# Patient Record
Sex: Male | Born: 1935 | Race: White | Hispanic: No | Smoking: Former smoker
Health system: Southern US, Community
[De-identification: ages and names within clinical notes are randomized; demographics above are authoritative.]

## PROBLEM LIST (undated history)

## (undated) DIAGNOSIS — G459 Transient cerebral ischemic attack, unspecified: Secondary | ICD-10-CM

## (undated) DIAGNOSIS — E119 Type 2 diabetes mellitus without complications: Secondary | ICD-10-CM

## (undated) DIAGNOSIS — R55 Syncope and collapse: Secondary | ICD-10-CM

## (undated) DIAGNOSIS — R2681 Unsteadiness on feet: Secondary | ICD-10-CM

## (undated) DIAGNOSIS — IMO0001 Reserved for inherently not codable concepts without codable children: Secondary | ICD-10-CM

## (undated) DIAGNOSIS — N183 Chronic kidney disease, stage 3 unspecified: Secondary | ICD-10-CM

## (undated) DIAGNOSIS — Z794 Long term (current) use of insulin: Secondary | ICD-10-CM

## (undated) DIAGNOSIS — R609 Edema, unspecified: Secondary | ICD-10-CM

## (undated) DIAGNOSIS — M109 Gout, unspecified: Secondary | ICD-10-CM

## (undated) DIAGNOSIS — I2510 Atherosclerotic heart disease of native coronary artery without angina pectoris: Secondary | ICD-10-CM

## (undated) DIAGNOSIS — M1990 Unspecified osteoarthritis, unspecified site: Secondary | ICD-10-CM

## (undated) DIAGNOSIS — I10 Essential (primary) hypertension: Secondary | ICD-10-CM

## (undated) HISTORY — DX: Unspecified osteoarthritis, unspecified site: M19.90

## (undated) HISTORY — PX: TONSILLECTOMY: SUR1361

## (undated) HISTORY — DX: Long term (current) use of insulin: Z79.4

## (undated) HISTORY — PX: HEMORRHOID SURGERY: SHX153

## (undated) HISTORY — DX: Syncope and collapse: R55

## (undated) HISTORY — DX: Edema, unspecified: R60.9

## (undated) HISTORY — DX: Unsteadiness on feet: R26.81

## (undated) HISTORY — DX: Reserved for inherently not codable concepts without codable children: IMO0001

## (undated) HISTORY — DX: Atherosclerotic heart disease of native coronary artery without angina pectoris: I25.10

## (undated) HISTORY — DX: Transient cerebral ischemic attack, unspecified: G45.9

## (undated) HISTORY — DX: Essential (primary) hypertension: I10

## (undated) HISTORY — PX: IVC FILTER INSERTION: CATH118245

## (undated) HISTORY — DX: Chronic kidney disease, stage 3 (moderate): N18.3

## (undated) HISTORY — DX: Type 2 diabetes mellitus without complications: E11.9

## (undated) HISTORY — DX: Chronic kidney disease, stage 3 unspecified: N18.30

## (undated) HISTORY — DX: Gout, unspecified: M10.9

---
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
## 1988-03-19 HISTORY — PX: CORONARY ARTERY BYPASS GRAFT: SHX141

## 2008-03-19 DIAGNOSIS — G459 Transient cerebral ischemic attack, unspecified: Secondary | ICD-10-CM

## 2008-03-19 HISTORY — DX: Transient cerebral ischemic attack, unspecified: G45.9

## 2013-03-19 DIAGNOSIS — R55 Syncope and collapse: Secondary | ICD-10-CM

## 2013-03-19 HISTORY — DX: Syncope and collapse: R55

## 2017-01-09 LAB — BASIC METABOLIC PANEL
BUN: 30 — AB (ref 4–21)
Creatinine: 1.7 — AB (ref 0.6–1.3)
Glucose: 256
Potassium: 4.3 (ref 3.4–5.3)
SODIUM: 136 — AB (ref 137–147)

## 2017-01-09 LAB — HEPATIC FUNCTION PANEL
ALK PHOS: 85 (ref 25–125)
ALT: 17 (ref 10–40)
AST: 18 (ref 14–40)
BILIRUBIN, TOTAL: 0.6

## 2017-01-09 LAB — CBC AND DIFFERENTIAL
HEMATOCRIT: 43 (ref 41–53)
HEMOGLOBIN: 14.3 (ref 13.5–17.5)
PLATELETS: 90 — AB (ref 150–399)
WBC: 7.5

## 2017-01-09 LAB — HEMOGLOBIN A1C: HEMOGLOBIN A1C: 8

## 2017-01-09 LAB — VITAMIN D 25 HYDROXY (VIT D DEFICIENCY, FRACTURES): Vit D, 25-Hydroxy: 41

## 2017-04-17 LAB — BASIC METABOLIC PANEL
BUN: 30 — AB (ref 4–21)
Creatinine: 1.6 — AB (ref 0.6–1.3)
Glucose: 192
Potassium: 4.2 (ref 3.4–5.3)
Sodium: 137 (ref 137–147)

## 2017-04-17 LAB — HEPATIC FUNCTION PANEL
ALK PHOS: 93 (ref 25–125)
ALT: 18 (ref 10–40)
AST: 20 (ref 14–40)
BILIRUBIN, TOTAL: 0.6

## 2017-04-17 LAB — HEMOGLOBIN A1C: HEMOGLOBIN A1C: 8.1

## 2017-08-14 ENCOUNTER — Ambulatory Visit: Payer: Self-pay | Admitting: Internal Medicine

## 2017-11-06 ENCOUNTER — Encounter: Payer: Self-pay | Admitting: Internal Medicine

## 2018-01-01 ENCOUNTER — Encounter: Payer: Self-pay | Admitting: Internal Medicine

## 2018-01-01 ENCOUNTER — Ambulatory Visit (INDEPENDENT_AMBULATORY_CARE_PROVIDER_SITE_OTHER): Payer: Medicare Other | Admitting: Internal Medicine

## 2018-01-01 VITALS — BP 148/80 | HR 56 | Ht 67.0 in | Wt 187.0 lb

## 2018-01-01 DIAGNOSIS — E782 Mixed hyperlipidemia: Secondary | ICD-10-CM | POA: Diagnosis not present

## 2018-01-01 DIAGNOSIS — IMO0001 Reserved for inherently not codable concepts without codable children: Secondary | ICD-10-CM

## 2018-01-01 DIAGNOSIS — I2581 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: Secondary | ICD-10-CM | POA: Diagnosis not present

## 2018-01-01 DIAGNOSIS — Z951 Presence of aortocoronary bypass graft: Secondary | ICD-10-CM | POA: Diagnosis not present

## 2018-01-01 DIAGNOSIS — E119 Type 2 diabetes mellitus without complications: Secondary | ICD-10-CM

## 2018-01-01 DIAGNOSIS — I10 Essential (primary) hypertension: Secondary | ICD-10-CM

## 2018-01-01 DIAGNOSIS — Z794 Long term (current) use of insulin: Secondary | ICD-10-CM

## 2018-01-01 NOTE — Patient Instructions (Addendum)
  Medication Instructions:  Continue current medications If you need a refill on your cardiac medications before your next appointment, please call your pharmacy.   Lab work: NONE   Testing/Procedures: NONE  Follow-Up: At [facility], you and your health needs are our priority.  As part of our continuing mission to provide you with exceptional heart care, we have created designated Provider Care Teams.  These Care Teams include your primary Cardiologist (physician) and Advanced Practice Providers (APPs -  Physician Assistants and Nurse Practitioners) who all work together to provide you with the care you need, when you need it. You will need a follow up appointment in 6 months.  Please call our office 2 months in advance to schedule this appointment.  You may see Dr. Debara Pickett or one of the following Advanced Practice Providers on your designated Care Team: Almyra Deforest, [place] . Fabian Sharp, PA-C  Any Other Special Instructions Will Be Listed Below (If Applicable).

## 2018-01-02 ENCOUNTER — Encounter: Payer: Self-pay | Admitting: Internal Medicine

## 2018-01-02 NOTE — Progress Notes (Signed)
OFFICE CONSULT NOTE  Chief Complaint:  Establish cardiologist  Primary Care Physician: Troy Poll, MD  HPI:  Troy Hicks is a 84 y.o. male who is being seen today for the evaluation of establishing cardiolgist at the request of Troy Papa, DO. This is a pleasant 84 year old male veteran who was previously living in [place] for the past 40 years.  He is originally from [place].  Recently he moved down here with his wife who unfortunately recently fell and is currently hospitalized with hip fracture and A. fib.  His past medical history is significant for coronary artery disease status post coronary artery bypass grafting a number of years ago, ischemic cardiomyopathy with EF around 16 to 40%, SVT, diabetes, hypertension, lower extremity venous insufficiency and some mitral and tricuspid valvular heart disease.  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  Currently is asymptomatic denies any chest pain or worsening shortness of breath.  Family history significant for heart disease in his mother who died at age 25.  PMHx:  Past Medical History:  Diagnosis Date  . Chronic kidney disease (CKD), stage III (moderate) ([place])    Per New Patient Packet,PSC   . Coronary artery disease    Per New Patient Packet,PSC   . Edema    Per New Patient Packet,PSC   . Gait instability    Per New Patient Packet,PSC   . Gout    Per New Patient Packet,PSC   . Hypertension    Per New Patient Packet,PSC   . Insulin dependent diabetes mellitus [facility]    Per New Patient Packet,PSC   . Osteoarthritis    Per New Patient Packet,PSC   . Syncope 2015   Per New Patient Packet,PSC   . TIA (transient ischemic attack) 2010   Per New Patient Packet,PSC     Past Surgical History:  Procedure Laterality Date  . CORONARY ARTERY BYPASS GRAFT  1990   Per New Patient Packet,PSC   . IVC FILTER INSERTION     Per New Patient Packet,PSC   . PILONIDAL  CYST EXCISION  1944   Per New Patient [place]     Per New Patient Packet,PSC (childhood)     FAMHx:  Family History  Problem Relation Age of Onset  . Diabetes Mother   . Heart disease Mother   . Throat cancer Father   . Cirrhosis Son   . Alcoholism Son   . Ovarian cancer Daughter   . Depression Daughter   . Alcoholism Daughter     SOCHx:   reports that he quit smoking about 60 years ago. He has a 7.50 pack-year smoking history. He has never used smokeless tobacco. He reports that he drinks alcohol. He reports that he has current or past drug history.  ALLERGIES:  Not on File  ROS: Pertinent items noted in HPI and remainder of comprehensive ROS otherwise negative.  HOME MEDS: Current Outpatient Medications on File Prior to Visit  Medication Sig Dispense Refill  . aspirin EC 81 MG tablet Take 81 mg by mouth daily.    . Colchicine 0.6 MG CAPS Take 1 capsule by mouth as directed.    . furosemide (LASIX) 80 MG tablet Take 80 mg by mouth daily.    . Insulin Aspart (NOVOLOG FLEXPEN [place]) Inject into the skin. Inject 2 units into the skin only if blood sugar is above 150    . Insulin Glargine (LANTUS SOLOSTAR)  100 UNIT/ML Solostar Pen Inject into the skin 2 (two) times daily. Takes 32 units in the A.M and 20 units at bedtime    . levothyroxine (SYNTHROID, LEVOTHROID) 88 MCG tablet Take 88 mcg by mouth daily before breakfast.    . metoprolol tartrate (LOPRESSOR) 25 MG tablet Take 25 mg by mouth 2 (two) times daily.  0  . potassium chloride SA (K-DUR,KLOR-CON) 20 MEQ tablet Take 20 mEq by mouth daily.    . probenecid (BENEMID) 500 MG tablet Take 500 mg by mouth daily.    . simvastatin (ZOCOR) 80 MG tablet Take 80 mg by mouth daily.    . traZODone (DESYREL) 50 MG tablet Take 50 mg by mouth at bedtime.     No current facility-administered medications on file prior to visit.     LABS/IMAGING: No results found for this or any previous visit (from the past 48  hour(s)). No results found.  LIPID PANEL: No results found for: Hicks, TRIG, HDL, CHOLHDL, VLDL, LDLCALC, LDLDIRECT  WEIGHTS: Wt Readings from Last 3 Encounters:  01/01/18 187 lb (84.8 kg)    VITALS: BP (!) 148/80   Pulse (!) 56   Ht 5\' 7"  (1.702 m)   Wt 187 lb (84.8 kg)   BMI 29.29 kg/m   EXAM: General appearance: alert and no distress Neck: no carotid bruit, no JVD and thyroid not enlarged, symmetric, no tenderness/mass/nodules Lungs: clear to auscultation bilaterally Heart: regular rate and rhythm, S1, S2 normal and systolic murmur: early systolic 2/6, blowing at apex Abdomen: soft, non-tender; bowel sounds normal; no masses,  no organomegaly Extremities: extremities normal, atraumatic, no cyanosis or edema Pulses: 2+ and symmetric Skin: Skin color, texture, turgor normal. No rashes or lesions Neurologic: Grossly normal Psych: Pleasant  EKG: Sinus bradycardia with marked sinus arrhythmia and first-degree AV block, RBBB, LVH with repolarization abnormality- personally reviewed  ASSESSMENT: 1. Coronary artery disease status post CABG 2. Ischemic cardiomyopathy EF 35 to 40% 3. History of PSVT 4. Type 2 diabetes 5. Hypertension 6. Dyslipidemia 7. History of DVT status post IVC filter  PLAN: 1.   [name] has a long-standing history of heart disease and ischemic cardiomyopathy.  He seems compensated and although he moves slowly probably endorses no more than NYHA class II symptoms.  We will need to gather more information on his type 2 diabetes hypertension and dyslipidemia.  I did receive records from his previous provider Dr. Verner Hicks indicating an echocardiogram that was performed at [facility] in 2016.  No further testing necessary at this time.  Plan follow-up with me in 6 months or sooner as necessary.  Troy Casino, MD, [facility], [place] Director of the Advanced Lipid Disorders &  Cardiovascular Risk Reduction  Clinic Diplomate of the American Board of Clinical Lipidology Attending Cardiologist  Direct Dial: [phone]  Fax: [phone]  Website:  [email or web address] 01/02/2018, 5:08 PM

## 2018-01-24 ENCOUNTER — Ambulatory Visit: Payer: Self-pay | Admitting: Cardiology

## 2018-03-27 ENCOUNTER — Telehealth: Payer: Self-pay | Admitting: Internal Medicine

## 2018-03-27 NOTE — Telephone Encounter (Signed)
Returned call to pt daughter [name]. lmtcb.

## 2018-03-27 NOTE — Telephone Encounter (Signed)
Follow up   Pt returning call for nurse, asked to please leave a detailed message because of having to hold when calling. Please call

## 2018-03-27 NOTE — Telephone Encounter (Signed)
New Message   Pt c/o Shortness Of Breath: STAT if SOB developed within the last 24 hours or pt is noticeably SOB on the phone  1. Are you currently SOB (can you hear that pt is SOB on the phone)? No, patients daughter is not with the patient    2. How long have you been experiencing SOB? About a week in a half patients daughter has been more aware   3. Are you SOB when sitting or when up moving around? At times when he is sitting and increased more when moving   4. Are you currently experiencing any other symptoms? Fatigue and sometime when he is talking he in mid sentence its as if he is sob

## 2018-03-27 NOTE — Telephone Encounter (Addendum)
Spoke with pt daughter Troy Hicks. Troy Hicks sts that the pt has been having increased sob. He does have chronic LE edema that she sts is stable. Pt does not weigh daily. He takes Lasix 40mg  twice daily.  Troy Hicks also reports that the pt is more fatigued and has a poor appetite.  The pt is scheduled with Dr.Hilty on 04/22/18. Offered an appt sooner with an APP. They would prefer to see Dr.Hilty, appt moved up to 04/10/18 @ 3:15pm. Adv pt daughter to contact the office sooner if symptoms worsen. Troy Hicks verbalizes understanding.

## 2018-04-10 ENCOUNTER — Ambulatory Visit (INDEPENDENT_AMBULATORY_CARE_PROVIDER_SITE_OTHER): Payer: Medicare Other | Admitting: Internal Medicine

## 2018-04-10 ENCOUNTER — Encounter (INDEPENDENT_AMBULATORY_CARE_PROVIDER_SITE_OTHER): Payer: Self-pay

## 2018-04-10 ENCOUNTER — Encounter: Payer: Self-pay | Admitting: Internal Medicine

## 2018-04-10 VITALS — BP 110/70 | HR 56 | Ht 67.0 in | Wt 182.0 lb

## 2018-04-10 DIAGNOSIS — Z951 Presence of aortocoronary bypass graft: Secondary | ICD-10-CM

## 2018-04-10 DIAGNOSIS — I255 Ischemic cardiomyopathy: Secondary | ICD-10-CM | POA: Insufficient documentation

## 2018-04-10 DIAGNOSIS — R0602 Shortness of breath: Secondary | ICD-10-CM

## 2018-04-10 DIAGNOSIS — I10 Essential (primary) hypertension: Secondary | ICD-10-CM

## 2018-04-10 DIAGNOSIS — I2581 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: Secondary | ICD-10-CM

## 2018-04-10 NOTE — Patient Instructions (Signed)
Medication Instructions:  Continue current medications If you need a refill on your cardiac medications before your next appointment, please call your pharmacy.   Lab work: BMET, BNP to be completed today If you have labs (blood work) drawn today and your tests are completely normal, you will receive your results only by: Marland Kitchen MyChart Message (if you have MyChart) OR . A paper copy in the mail If you have any lab test that is abnormal or we need to change your treatment, we will call you to review the results.  Testing/Procedures: Your physician has requested that you have an echocardiogram. Echocardiography is a painless test that uses sound waves to create images of your heart. It provides your doctor with information about the size and shape of your heart and how well your heart's chambers and valves are working. This procedure takes approximately one hour. There are no restrictions for this procedure. -- this is done at [facility] on [address] (near [facility])  - [address] - [address]rd Floor  Follow-Up: in April as planneded  Any Other Special Instructions Will Be Listed Below (If Applicable).

## 2018-04-10 NOTE — Progress Notes (Signed)
OFFICE CONSULT NOTE  Chief Complaint:  Acute shortness of breath  Primary Care Physician: Reymundo Poll, MD  HPI:  Troy Hicks is a 85 y.o. male who is being seen today for the evaluation of establishing cardiolgist at the request of Reymundo Poll, MD. This is a pleasant 85 year old male veteran who was previously living in [place] for the past 40 years.  He is originally from [place].  Recently he moved down here with his wife who unfortunately recently fell and is currently hospitalized with hip fracture and A. fib.  His past medical history is significant for coronary artery disease status post coronary artery bypass grafting a number of years ago, ischemic cardiomyopathy with EF around 107 to 40%, SVT, diabetes, hypertension, lower extremity venous insufficiency and some mitral and tricuspid valvular heart disease.  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  Currently is asymptomatic denies any chest pain or worsening shortness of breath.  Family history significant for heart disease in his mother who died at age 53.  05-08-18  Troy Hicks returns today for follow-up.  Actually this was not a routine visit.  His daughter brought him in because he has had worsening fatigue and shortness of breath.  Unfortunately his wife died in 04-07-23.  He since then has been somewhat depressed.  He has been complaining of chronic back pain.  He is also been short of breath to her observation but denies any chest pain.  Blood pressures well controlled today.  He is actually had some weight loss.  His appetite has decreased.  He has seen a psychiatrist who prescribes medications to help with sleep and was placed on Zoloft.  PMHx:  Past Medical History:  Diagnosis Date  . Chronic kidney disease (CKD), stage III (moderate) ([place])    Per New Patient Packet,PSC   . Coronary artery disease    Per New Patient Packet,PSC   . Edema    Per New  Patient Packet,PSC   . Gait instability    Per New Patient Packet,PSC   . Gout    Per New Patient Packet,PSC   . Hypertension    Per New Patient Packet,PSC   . Insulin dependent diabetes mellitus [facility])    Per New Patient Packet,PSC   . Osteoarthritis    Per New Patient Packet,PSC   . Syncope 2015   Per New Patient Packet,PSC   . TIA (transient ischemic attack) 2010   Per New Patient Packet,PSC     Past Surgical History:  Procedure Laterality Date  . CORONARY ARTERY BYPASS GRAFT  1990   Per New Patient Packet,PSC   . IVC FILTER INSERTION     Per New Patient Packet,PSC   . PILONIDAL CYST EXCISION  1944   Per New Patient [place]     Per New Patient Packet,PSC (childhood)     FAMHx:  Family History  Problem Relation Age of Onset  . Diabetes Mother   . Heart disease Mother   . Throat cancer Father   . Cirrhosis Son   . Alcoholism Son   . Ovarian cancer Daughter   . Depression Daughter   . Alcoholism Daughter     SOCHx:   reports that he quit smoking about 61 years ago. He has a 7.50 pack-year smoking history. He has never used smokeless tobacco. He reports current alcohol use. He reports previous drug use.  ALLERGIES:  No Known Allergies  ROS: Pertinent items noted in HPI and remainder of comprehensive ROS otherwise negative.  HOME MEDS: Current Outpatient Medications on File Prior to Visit  Medication Sig Dispense Refill  . Alogliptin Benzoate 25 MG TABS Take 1 tablet by mouth daily.    Marland Kitchen aspirin EC 81 MG tablet Take 81 mg by mouth daily.    . Colchicine 0.6 MG CAPS Take 1 capsule by mouth as directed.    . furosemide (LASIX) 80 MG tablet Take 80 mg by mouth daily.    . insulin glargine (LANTUS) 100 UNIT/ML injection Inject 5 Units into the skin at bedtime.    Marland Kitchen levothyroxine (SYNTHROID, LEVOTHROID) 100 MCG tablet Take 100 mcg by mouth daily before breakfast.    . metoprolol tartrate (LOPRESSOR) 25 MG tablet Take 25 mg by mouth 2 (two)  times daily.  0  . Multiple Vitamins-Minerals (PRESERVISION AREDS) CAPS Take 1 capsule by mouth 2 (two) times daily.    . potassium chloride SA (K-DUR,KLOR-CON) 20 MEQ tablet Take 20 mEq by mouth daily.    . sertraline (ZOLOFT) 25 MG tablet Take 25 mg by mouth daily.    . simvastatin (ZOCOR) 80 MG tablet Take 80 mg by mouth daily.    . traMADol (ULTRAM) 50 MG tablet Take 25 mg by mouth 2 (two) times daily.    . traZODone (DESYREL) 50 MG tablet Take 50 mg by mouth at bedtime.     No current facility-administered medications on file prior to visit.     LABS/IMAGING: No results found for this or any previous visit (from the past 48 hour(s)). No results found.  LIPID PANEL: No results found for: CHOL, TRIG, HDL, CHOLHDL, VLDL, LDLCALC, LDLDIRECT  WEIGHTS: Wt Readings from Last 3 Encounters:  04/10/18 182 lb (82.6 kg)  01/01/18 187 lb (84.8 kg)    VITALS: BP 110/70   Pulse (!) 56   Ht 5\' 7"  (1.702 m)   Wt 182 lb (82.6 kg)   BMI 28.51 kg/m   EXAM: General appearance: alert and no distress Neck: no carotid bruit, no JVD and thyroid not enlarged, symmetric, no tenderness/mass/nodules Lungs: diminished breath sounds bilaterally Heart: regular rate and rhythm, S1, S2 normal and systolic murmur: early systolic 2/6, blowing at apex Abdomen: soft, non-tender; bowel sounds normal; no masses,  no organomegaly Extremities: edema Trace bilateral pedal edema Pulses: 2+ and symmetric Skin: Skin color, texture, turgor normal. No rashes or lesions Neurologic: Grossly normal Psych: Pleasant  EKG: Sinus bradycardia marked sinus arrhythmia 56, nonspecific IVCD and inferior infarct pattern-personally reviewed  ASSESSMENT: 1. Acute dyspnea on exertion 2. Coronary artery disease status post CABG 3. Ischemic cardiomyopathy EF 35 to 40% 4. History of PSVT 5. Type 2 diabetes 6. Hypertension 7. Dyslipidemia 8. History of DVT status post IVC filter  PLAN: 1.   Troy Hicks has recently had  some acute dyspnea on exertion but actually weight loss.  His appetite is decreased and he is struggling with grieving or possible early depression.  He has been placed on antidepressant medication.  I do not detect any worsening heart failure symptoms.  Would like to repeat an echo since he has a history of ischemic cardiomyopathy and will get labs including a BMET and BNP.  Possible further adjustment in his medications based on these findings.  Otherwise we will plan follow-up as scheduled in April.  Pixie Casino, MD, [facility], [place] Director of the Advanced Lipid Disorders &  Cardiovascular Risk  Reduction Clinic Diplomate of the American Board of Clinical Lipidology Attending Cardiologist  Direct Dial: [phone]  Fax: [phone]  Website:  [email or web address] 04/10/2018, 3:33 PM

## 2018-04-11 ENCOUNTER — Telehealth: Payer: Self-pay | Admitting: Internal Medicine

## 2018-04-11 DIAGNOSIS — Z79899 Other long term (current) drug therapy: Secondary | ICD-10-CM

## 2018-04-11 LAB — BASIC METABOLIC PANEL
BUN/Creatinine Ratio: 22 (ref 10–24)
BUN: 42 mg/dL — AB (ref 10–36)
CHLORIDE: 100 mmol/L (ref 96–106)
CO2: 21 mmol/L (ref 20–29)
Calcium: 8.7 mg/dL (ref 8.6–10.2)
Creatinine, Ser: 1.87 mg/dL — ABNORMAL HIGH (ref 0.76–1.27)
GFR calc Af Amer: 34 mL/min/{1.73_m2} — ABNORMAL LOW (ref >59)
GFR calc non Af Amer: 30 mL/min/{1.73_m2} — ABNORMAL LOW (ref >59)
GLUCOSE: 263 mg/dL — AB (ref 65–99)
Potassium: 5.1 mmol/L (ref 3.5–5.2)
Sodium: 137 mmol/L (ref 134–144)

## 2018-04-11 LAB — PRO B NATRIURETIC PEPTIDE: NT-Pro BNP: 1719 pg/mL — ABNORMAL HIGH (ref 0–486)

## 2018-04-11 NOTE — Telephone Encounter (Signed)
Notified daughter of results. BMET ordered to be done same day as echo @ [address] Labs routed to PCP per request. Per daughter, no new Rx needed now - if dose increase is permanent, will likely need printed Rx to take to [place]

## 2018-04-11 NOTE — Telephone Encounter (Signed)
Notes recorded by Pixie Casino, MD on 04/11/2018 at 10:02 AM EST Creatinine up - BNP elevated. Suspect worsening CHF. Increase lasix to 80 mg BID - repeat BMET in 1 week. Echo pending.  Dr. Lemmie Evens

## 2018-04-14 ENCOUNTER — Telehealth: Payer: Self-pay | Admitting: Internal Medicine

## 2018-04-14 NOTE — Telephone Encounter (Signed)
Called Assisted Living, they advised they could not take a verbal order. It should have to come from the MD. Advised that MD was not here today. They will send a blank hard script faxed over attn to me to have. Will fill out and give to Dr.Hilty and his nurse to sign and have it faxed back to Assisted living.

## 2018-04-14 NOTE — Telephone Encounter (Signed)
New Message   Pt c/o medication issue:  1. Name of Medication: Lasix  2. How are you currently taking this medication (dosage and times per day)? 80mg  2xdaily   3. Are you having a reaction (difficulty breathing--STAT)? No  4. What is your medication issue? pts daughter is calling because she needs a fax of a hard script change to Pts assited living facility   [facility]  Fax # [phone]  ATTN: MED Tech   Pts daughter says this needs to be done ASAP so he can take his medication

## 2018-04-15 MED ORDER — FUROSEMIDE 80 MG PO TABS: 80.0000 mg | ORAL_TABLET | Freq: Two times a day (BID) | ORAL | 1 refills | Status: DC

## 2018-04-15 NOTE — Telephone Encounter (Signed)
° ° °  Patient's daughter is calling back to confirm if fax has been sent regarding Lasix to the assisted living and the PCP. Please call [phone]

## 2018-04-15 NOTE — Telephone Encounter (Signed)
Troy Hicks, [place] on MD care team signed printed Rx and this was faxed to [place] [facility] as no hard copy Rx request was received from this facility.   LM for daughter with this info.

## 2018-04-16 ENCOUNTER — Telehealth: Payer: Self-pay | Admitting: Internal Medicine

## 2018-04-16 DIAGNOSIS — Z79899 Other long term (current) drug therapy: Secondary | ICD-10-CM

## 2018-04-16 NOTE — Telephone Encounter (Signed)
Called and spoke with patient's daughter about medication. She states she is very frustrated with our phone system and does not have 20 minutes to wait on the phone to return a call, of which I requested to obtain a contact for [facility] to f/up on lasix Rx. I apologized to daughter for this inconvenience. She states that PCP did not receive labs that I faxed via Epic on 04/11/18 @ 11:02am (informed her of this). She is frustrated that her dad has not yet received increased lasix dose. Explained that when we spoke on 1/24 about results, I was unaware that patient needed a new Rx for this medication sent to [facility] and there was a delay in faxing the printed Rx as [facility] was supposed to send something to our office on 1/27 which was not received. The Rx sent via fax on 1/28 was not received per daughter but the 1/29 fax was received. Again, apologized to daughter about this delay. Explained that I will cancel 04/17/18 lab visit at [facility] office and she will bring to [facility] for BMET once patient has been on increased lasix dose for 1 week. Lab ordered. Explained to daughter that she can look into MyChart for her dad to alleviate phone frustration, but she states she does not do anything medically related on these portals.   No further assistance needed.

## 2018-04-16 NOTE — Telephone Encounter (Signed)
Aaron Edelman from [facility] (pharmacy) called in regarding lasix Rx. Notified him of lasix dose increase from 80mg  QD to BID for 1 week and then repeat BMET.   (p) [phone]

## 2018-04-16 NOTE — Telephone Encounter (Signed)
  Daughter is calling because her dads prescription for lasix has not been faxed to [facility]. She stated that she talked to [place] yesterday and was told it was faxed. Patient is supposed to come in tomorrow for blood work related to this new script. Patient will need to know when they should come for the lab work once he gets to start the script. Please fax to [phone] to [facility].

## 2018-04-16 NOTE — Telephone Encounter (Signed)
LM for daughter that Rx was faxed yesterday and I will re-fax today. Requested that she provide phone # for Alfredo Bach so I can follow up on this.

## 2018-04-17 ENCOUNTER — Ambulatory Visit (HOSPITAL_COMMUNITY): Payer: Medicare Other | Attending: Cardiovascular Disease

## 2018-04-17 ENCOUNTER — Other Ambulatory Visit (HOSPITAL_COMMUNITY): Payer: Medicare Other

## 2018-04-17 ENCOUNTER — Other Ambulatory Visit: Payer: Medicare Other

## 2018-04-17 DIAGNOSIS — R0602 Shortness of breath: Secondary | ICD-10-CM | POA: Insufficient documentation

## 2018-04-17 DIAGNOSIS — I255 Ischemic cardiomyopathy: Secondary | ICD-10-CM | POA: Insufficient documentation

## 2018-04-22 ENCOUNTER — Ambulatory Visit: Payer: Medicare Other | Admitting: Internal Medicine

## 2018-04-24 ENCOUNTER — Telehealth: Payer: Self-pay | Admitting: Internal Medicine

## 2018-04-24 NOTE — Telephone Encounter (Signed)
New Message   Patient states 04/08/18 fax to Dr. Emi Holes office wasn't received.  Could you please resend fax to [phone] and give the patient a call.

## 2018-04-24 NOTE — Telephone Encounter (Signed)
Faxed information to office as requested, confirmation received. Did send via Epic as well. Advised daughter if any further issues to call back.

## 2018-04-26 LAB — BASIC METABOLIC PANEL
BUN / CREAT RATIO: 22 (ref 10–24)
BUN: 47 mg/dL — ABNORMAL HIGH (ref 10–36)
CO2: 19 mmol/L — ABNORMAL LOW (ref 20–29)
Calcium: 8.5 mg/dL — ABNORMAL LOW (ref 8.6–10.2)
Chloride: 99 mmol/L (ref 96–106)
Creatinine, Ser: 2.09 mg/dL — ABNORMAL HIGH (ref 0.76–1.27)
GFR calc Af Amer: 30 mL/min/{1.73_m2} — ABNORMAL LOW (ref >59)
GFR calc non Af Amer: 26 mL/min/{1.73_m2} — ABNORMAL LOW (ref >59)
Glucose: 301 mg/dL — ABNORMAL HIGH (ref 65–99)
Potassium: 5.7 mmol/L — ABNORMAL HIGH (ref 3.5–5.2)
Sodium: 134 mmol/L (ref 134–144)

## 2018-04-28 ENCOUNTER — Telehealth: Payer: Self-pay

## 2018-04-28 DIAGNOSIS — E875 Hyperkalemia: Secondary | ICD-10-CM

## 2018-04-28 DIAGNOSIS — N289 Disorder of kidney and ureter, unspecified: Secondary | ICD-10-CM

## 2018-04-28 DIAGNOSIS — Z5181 Encounter for therapeutic drug level monitoring: Secondary | ICD-10-CM

## 2018-04-28 NOTE — Telephone Encounter (Signed)
° ° °  Please return call for results

## 2018-04-28 NOTE — Telephone Encounter (Signed)
DPR on file. Called to give lab results and Dr.Hity's recommendation. lmtcb for Troy Hicks pt daughter listed on the [place].

## 2018-04-28 NOTE — Telephone Encounter (Signed)
-----   Message from Pixie Casino, MD sent at 04/26/2018 10:47 AM EST ----- STOP potassium supplement - creatinine rising, decrease lasix to 80 mg daily. Repeat BMET next week.  Dr. Lemmie Evens

## 2018-04-28 NOTE — Telephone Encounter (Signed)
Advised daughter, verbalized understanding  

## 2018-04-28 NOTE — Telephone Encounter (Signed)
Results and Dr.Hilty's recommendations faxed to  [facility] fax# [phone], and Alfredo Bach (med tech) fax # [phone].

## 2018-05-08 ENCOUNTER — Encounter: Payer: Self-pay | Admitting: Gastroenterology

## 2018-05-13 NOTE — Telephone Encounter (Signed)
2nd attempt. Spoke with [name] the pt daughter. Beth sts that she has contacted [facility] [employer] and spoke with Dominica, med tech. Jodi Mourning confirmed that the order from our office was received back on 04/28/18, and she is not sure why the repeat lab work was not done. [name] that I will f/u with Dominica to see when the labwork can be drawn. If it is several days out Beth sts that she can bring the pt to our office for labs. [name] that I will update her after I have the opportunity to talk with Alfredo Bach.

## 2018-05-13 NOTE — Telephone Encounter (Signed)
Called pt daughter [name] to f/u on previous message lmom. Will attempt to contact again.

## 2018-05-13 NOTE — Telephone Encounter (Addendum)
Called [facility] to f/u on Bmet that was to be repeated around 04/28/18 and results faxed to our office per Dr.Hilty. Spoke with Troy Hicks the med tech that help to care for the pt. Troy Hicks sts that she does not see where the pt labs were repeated. I asked her to confirm that the medication changes made by Dr.Hilty were implemented. Reduce Lasix to 80mg  daily and STOP the Potassium supplement. She was able to find records and repeated Dr.Hilty's recommendation, but not able to locate repeat lab results. [name] recommend contacting  the service that they use to draw their residence labs. [facility] [phone].   Called pt daughter Troy Hicks. Pt sts that she was made aware of the lab results and med change but was not aware of the 1 week repeat lab work. [name] the results and Dr.Hilty instructions were faxed both to [facility] and [facility] with the faxes confirmed. She sts that all communication for her Dads care needs to be communicated to her first. [name] that our triage nurse returned her call for results on 04/28/18 and gave her the results and Dr.Hilty's recommendation.  Troy Hicks sts that she will contact Dr.Tripp's office to f/u on whether or not labs were repeated. If not she can bring the pt to our office for labs. Adv her that I am in clinic this afternoon, but I will give her a f/u call so that she can provide an update. Troy Hicks verbalized understanding and voiced appreciation for the f/u call.

## 2018-05-14 NOTE — Telephone Encounter (Signed)
Spoke with Troy Hicks, Med tech at [facility]. Troy Hicks sts that [facility] the service that comes out to draw their residents labs will be there tomorrow to draw the patients Bmet. Provided our offices "nurse fax#" and asked that the results be faxed to Dr.Hilty's attn as soon as available. Troy Hicks verbalizes understanding.  Called pt daughter Troy Hicks and made her aware of the message above.

## 2018-05-15 ENCOUNTER — Other Ambulatory Visit: Payer: Self-pay | Admitting: Internal Medicine

## 2018-05-15 ENCOUNTER — Telehealth: Payer: Self-pay

## 2018-05-15 ENCOUNTER — Ambulatory Visit (INDEPENDENT_AMBULATORY_CARE_PROVIDER_SITE_OTHER): Payer: Medicare Other | Admitting: Gastroenterology

## 2018-05-15 ENCOUNTER — Other Ambulatory Visit: Payer: Self-pay

## 2018-05-15 ENCOUNTER — Encounter: Payer: Self-pay | Admitting: Gastroenterology

## 2018-05-15 ENCOUNTER — Encounter (INDEPENDENT_AMBULATORY_CARE_PROVIDER_SITE_OTHER): Payer: Self-pay

## 2018-05-15 VITALS — BP 110/60 | HR 58 | Ht 67.0 in | Wt 181.0 lb

## 2018-05-15 DIAGNOSIS — R1310 Dysphagia, unspecified: Secondary | ICD-10-CM

## 2018-05-15 DIAGNOSIS — Z7902 Long term (current) use of antithrombotics/antiplatelets: Secondary | ICD-10-CM | POA: Diagnosis not present

## 2018-05-15 NOTE — Progress Notes (Signed)
Opened in error

## 2018-05-15 NOTE — Telephone Encounter (Signed)
[facility] Pre-operative Risk Assessment     Request for surgical clearance:     Endoscopy Procedure  What type of surgery is being performed?     EGD with Dil  When is this surgery scheduled?     05/22/2018  What type of clearance is required ?   Pharmacy  Are there any medications that need to be held prior to surgery and how long? Aspirin 61m 5 days  Practice name and name of physician performing surgery?      [facility]  What is your office phone and fax number?      Phone- [phone] Fax-[phone] Anesthesia type (None, local, MAC, general) ?       MAC

## 2018-05-15 NOTE — Progress Notes (Signed)
Chief Complaint: Dysphagia   Referring Provider:   Reymundo Poll, MD     HPI:     Troy Hicks is a 85 y.o. male w a hx of CHF (EF 35-40%), CKD3, DM, CAD/CABG, referred to the [facility]astroenterology Clinic for evaluation of solid food dysphagia.  He states sxs started approx 40 year ago. Evaluated in [place], [place] at that time and reports hx of reflux and ?stricture.  No previous esophageal dilation.  Symptoms have been worsening lately, with increasing frequency, pointing to lower sternum.  No issue with liquids alone. Has had to regurgitate foods back out x3.  No prior food impactions and has not gone to the ER for the symptoms.  Otherwise weight has been stable.  Has not noticed any recent reflux symptoms.  Otherwise, no hematochezia, melena, abdominal pain, fever, chills.  Takes ASA 81 mg daily.    Past Medical History:  Diagnosis Date  . Chronic kidney disease (CKD), stage III (moderate) ([place])    Per [facility]   . Coronary artery disease    Per [facility]   . Edema    Per [facility]   . Gait instability    Per [facility]   . Gout    Per [facility]   . Hypertension    Per [facility]   . Insulin dependent diabetes mellitus [facility])    Per [facility]   . Osteoarthritis    Per [facility]   . Syncope 2015   Per [facility]   . TIA (transient ischemic attack) 2010   Per [facility] Patient [facility]      Past Surgical History:  Procedure Laterality Date  . CORONARY ARTERY BYPASS GRAFT  1990   Per [facility] Patient [facility]   . IVC FILTER INSERTION     Per [facility]   . PILONIDAL CYST EXCISION  1944   Per New Patient [place]     Per [facility] Patient Packet,PSC (childhood)    Family History  Problem Relation Age of Onset  . Diabetes Mother   . Heart disease Mother   . Throat cancer Father   . Cirrhosis Son   . Alcoholism Son   .  Ovarian cancer Daughter   . Depression Daughter   . Alcoholism Daughter   . Colon cancer Neg Hx    Social History   Tobacco Use  . Smoking status: Former Smoker    Packs/day: 0.50    Years: 15.00    Pack years: 7.50    Last attempt to quit: 03/19/1957    Years since quitting: 61.1  . Smokeless tobacco: Never Used  . Tobacco comment: 60 years ago as of 2019   Substance Use Topics  . Alcohol use: Yes    Comment: 1 drink per day  . Drug use: Not Currently   Current Outpatient Medications  Medication Sig Dispense Refill  . acetaminophen (TYLENOL) 500 MG tablet Take 500 mg by mouth every 6 (six) hours as needed.    . senna (SENOKOT) 8.6 MG TABS tablet Take 1 tablet by mouth every Monday, Wednesday, and Friday.    . Alogliptin Benzoate 25 MG TABS Take 1 tablet by mouth daily.    Marland Kitchen aspirin EC 81 MG tablet Take 81 mg by mouth daily.    . Colchicine 0.6 MG CAPS Take 1 capsule by mouth  as directed.    . furosemide (LASIX) 80 MG tablet Take 80 mg by mouth daily.    . insulin glargine (LANTUS) 100 UNIT/ML injection Inject 5 Units into the skin at bedtime.    Marland Kitchen levothyroxine (SYNTHROID, LEVOTHROID) 100 MCG tablet Take 100 mcg by mouth daily before breakfast.    . metoprolol tartrate (LOPRESSOR) 25 MG tablet Take 25 mg by mouth 2 (two) times daily.  0  . Multiple Vitamins-Minerals (PRESERVISION AREDS) CAPS Take 1 capsule by mouth 2 (two) times daily.    . sertraline (ZOLOFT) 25 MG tablet Take 25 mg by mouth daily.    . simvastatin (ZOCOR) 80 MG tablet Take 80 mg by mouth daily.    . traMADol (ULTRAM) 50 MG tablet Take 25 mg by mouth 2 (two) times daily.    . traZODone (DESYREL) 50 MG tablet Take 50 mg by mouth at bedtime.     No current facility-administered medications for this visit.    No Known Allergies   Review of Systems: All systems reviewed and negative except where noted in HPI.     Physical Exam:    Wt Readings from Last 3 Encounters:  05/15/18 181 lb (82.1 kg)  04/10/18  182 lb (82.6 kg)  01/01/18 187 lb (84.8 kg)    BP 110/60   Pulse (!) 58   Ht 5\' 7"  (1.702 m)   Wt 181 lb (82.1 kg)   BMI 28.35 kg/m  Constitutional:  Pleasant, in no acute distress. Psychiatric: Normal mood and affect. Behavior is normal. EENT: Pupils normal.  Conjunctivae are normal. No scleral icterus. Neck supple. No cervical LAD. Cardiovascular: Normal rate, regular rhythm. No edema.  2/6 SEM. Pulmonary/chest: Effort normal and breath sounds normal. No wheezing, rales or rhonchi. Abdominal: Soft, nondistended, nontender. Bowel sounds active throughout. There are no masses palpable. No hepatomegaly. Neurological: Alert and oriented to person place and time. Skin: Skin is warm and dry. No rashes noted.   ASSESSMENT AND PLAN;   Troy Hicks is a 85 y.o. male presenting with:  1) Dysphagia: Longstanding history of solid food dysphagia, with ?  Prior esophageal stricture.  Now with worsening dysphagia.  Evaluate and treat as below:  - Discussed further evaluation with barium esophagram versus EGD with dilation, and he would prefer the latter - EGD with dilation - Advised patient to cut food into small pieces, eat small bites, chew food thoroughly and with plenty of liquids to avoid food impaction.  2) Chronic antiplatelet therapy: - Hold ASA  5 days before procedure - will instruct when and how to resume after procedure. Low but real risk of cardiovascular event such as heart attack, stroke, embolism, thrombosis or ischemia/infarct of other organs off ASA explained and need to seek urgent help if this occurs. The patient consents to proceed. Will communicate by phone or EMR with patient's prescribing provider to confirm that holding ASA is reasonable in this case  3) History of CAD and CHF: TTE with 35 to 40% EF.  Was seen last month by his Cardiologist.  Cardiology clearance requested prior to EGD with dilation.  The indications, risks, and benefits of EGD and dilation were  explained to the patient and his family member in detail. Risks include but are not limited to bleeding, perforation, adverse reaction to medications, and cardiopulmonary compromise. Sequelae include but are not limited to the possibility of surgery, hositalization, and mortality. The patient verbalized understanding and wished to proceed. All questions answered, referred to scheduler. Further recommendations  pending results of the exam.    Lavena Bullion, DO, FACG  05/15/2018, 11:32 AM   Reymundo Poll, MD

## 2018-05-15 NOTE — Telephone Encounter (Signed)
Ok to hold aspirin 5 days prior to GI procedure. Restart after.  Dr. Debara Pickett

## 2018-05-15 NOTE — Patient Instructions (Signed)
You have been scheduled for an endoscopy. Please follow written instructions given to you at your visit today. If you use inhalers (even only as needed), please bring them with you on the day of your procedure. Your physician has requested that you go to www.startemmi.com and enter the access code given to you at your visit today. This web site gives a general overview about your procedure. However, you should still follow specific instructions given to you by our office regarding your preparation for the procedure.  Hold your Aspirin for 5 days prior to procedure.  It was a pleasure to see you today!  Vito Cirigliano, D.O.

## 2018-05-16 ENCOUNTER — Telehealth: Payer: Self-pay | Admitting: Gastroenterology

## 2018-05-16 LAB — BASIC METABOLIC PANEL
BUN/Creatinine Ratio: 19 (ref 10–24)
BUN: 32 mg/dL (ref 10–36)
CO2: 24 mmol/L (ref 20–29)
Calcium: 8.5 mg/dL — ABNORMAL LOW (ref 8.6–10.2)
Chloride: 102 mmol/L (ref 96–106)
Creatinine, Ser: 1.72 mg/dL — ABNORMAL HIGH (ref 0.76–1.27)
GFR calc Af Amer: 38 mL/min/{1.73_m2} — ABNORMAL LOW (ref >59)
GFR calc non Af Amer: 33 mL/min/{1.73_m2} — ABNORMAL LOW (ref >59)
Glucose: 194 mg/dL — ABNORMAL HIGH (ref 65–99)
Potassium: 5.1 mmol/L (ref 3.5–5.2)
Sodium: 141 mmol/L (ref 134–144)

## 2018-05-16 NOTE — Telephone Encounter (Signed)
Pt called to ensure that signed orders have been sent to Dr. Shona Simpson and to pt's assisted living facility.  Pt informed that med change starting tomorrow.

## 2018-05-16 NOTE — Telephone Encounter (Signed)
Marshell Levan from pharmacy called in about the orders for the medication and is needing a call back from the nurse to discuss.

## 2018-05-16 NOTE — Telephone Encounter (Signed)
Spoke with Marshell Levan from [facility] who was calling to verify exactly how many units of Lantus to administer the evening prior to the patients procedure. The patient takes 9units of Lantus every evening. Dr. Bryan Lemma verbally ordered the patient to administer 5 units of Lantus the evening before his procedure. Marshell Levan verbalized understanding of orders.

## 2018-05-16 NOTE — Telephone Encounter (Signed)
I received this message-is there something I need to do for this patient?

## 2018-05-16 NOTE — Telephone Encounter (Signed)
Verified that [facility] received orders via fax.

## 2018-05-22 ENCOUNTER — Ambulatory Visit (AMBULATORY_SURGERY_CENTER): Payer: Medicare Other | Admitting: Gastroenterology

## 2018-05-22 ENCOUNTER — Encounter: Payer: Self-pay | Admitting: Gastroenterology

## 2018-05-22 ENCOUNTER — Other Ambulatory Visit: Payer: Self-pay

## 2018-05-22 VITALS — BP 122/62 | HR 64 | Temp 97.1°F | Resp 13 | Ht 67.0 in | Wt 181.0 lb

## 2018-05-22 DIAGNOSIS — R1310 Dysphagia, unspecified: Secondary | ICD-10-CM

## 2018-05-22 DIAGNOSIS — K2990 Gastroduodenitis, unspecified, without bleeding: Secondary | ICD-10-CM

## 2018-05-22 DIAGNOSIS — D132 Benign neoplasm of duodenum: Secondary | ICD-10-CM

## 2018-05-22 DIAGNOSIS — K3189 Other diseases of stomach and duodenum: Secondary | ICD-10-CM

## 2018-05-22 DIAGNOSIS — K2980 Duodenitis without bleeding: Secondary | ICD-10-CM | POA: Diagnosis not present

## 2018-05-22 DIAGNOSIS — R1319 Other dysphagia: Secondary | ICD-10-CM

## 2018-05-22 DIAGNOSIS — K2970 Gastritis, unspecified, without bleeding: Secondary | ICD-10-CM

## 2018-05-22 DIAGNOSIS — K2289 Other specified disease of esophagus: Secondary | ICD-10-CM

## 2018-05-22 DIAGNOSIS — K228 Other specified diseases of esophagus: Secondary | ICD-10-CM

## 2018-05-22 MED ORDER — FLUCONAZOLE 100 MG PO TABS: 100.0000 mg | ORAL_TABLET | Freq: Every day | ORAL | 0 refills | Status: DC

## 2018-05-22 MED ORDER — OMEPRAZOLE 40 MG PO CPDR: 40.0000 mg | DELAYED_RELEASE_CAPSULE | Freq: Every day | ORAL | 0 refills | Status: DC

## 2018-05-22 MED ORDER — SODIUM CHLORIDE 0.9 % IV SOLN: 500.0000 mL | Freq: Once | INTRAVENOUS | Status: DC

## 2018-05-22 MED ORDER — OMEPRAZOLE 40 MG PO CPDR: 40.0000 mg | DELAYED_RELEASE_CAPSULE | Freq: Every day | ORAL | 3 refills | Status: DC

## 2018-05-22 NOTE — Op Note (Signed)
[facility] Patient Name: Troy Hicks Procedure Date: 05/22/2018 12:10 PM MRN: [id number] Endoscopist: Gerrit Heck , MD Age: 85 Referring MD:  Date of Birth: 1919-06-17 Gender: Male Account #: [id number] Procedure:                Upper GI endoscopy Indications:              Dysphagia Medicines:                Monitored Anesthesia Care Procedure:                Pre-Anesthesia Assessment:                           - Prior to the procedure, a History and Physical                            was performed, and patient medications and                            allergies were reviewed. The patient's tolerance of                            previous anesthesia was also reviewed. The risks                            and benefits of the procedure and the sedation                            options and risks were discussed with the patient.                            All questions were answered, and informed consent                            was obtained. Prior Anticoagulants: The patient has                            taken aspirin, last dose was 5 days prior to                            procedure. ASA Grade Assessment: III - A patient                            with severe systemic disease. After reviewing the                            risks and benefits, the patient was deemed in                            satisfactory condition to undergo the procedure.                           After obtaining informed consent, the endoscope was  passed under direct vision. Throughout the                            procedure, the patient's blood pressure, pulse, and                            oxygen saturations were monitored continuously. The                            Endoscope was introduced through the mouth, and                            advanced to the second part of duodenum. The upper                            GI endoscopy was accomplished without  difficulty.                            The patient tolerated the procedure well. Scope In: Scope Out: Findings:                 Diffuse, white plaques were found in the entire                            esophagus. Biopsies were taken with a cold forceps                            for histology. Estimated blood loss was minimal.                           The lower third of the esophagus was moderately                            tortuous. Otherwise, no esophageal rings,                            strictures, or areas of luminal narrowing that                            required esophageal dilation.                           The Z-line was regular and was found 40 cm from the                            incisors.                           Diffuse inflammation characterized by congestion                            (edema), non-bleeding erosions, and erythema was                            found in the gastric fundus, in  the gastric body,                            at the incisura, in the gastric antrum, in the                            prepyloric region of the stomach and at the                            pylorus. There was hematin without any active                            bleeding. Biopsies were taken with a cold forceps                            for histology. Estimated blood loss was minimal.                           A single 4 mm sessile polyp with no bleeding was                            found in the first portion of the duodenum. The                            polyp was removed with a cold biopsy forceps.                            Resection and retrieval were complete. Estimated                            blood loss was minimal.                           The duodenal bulb and second portion of the                            duodenum were normal. Complications:            No immediate complications. Estimated Blood Loss:     Estimated blood loss was minimal. Impression:                - Esophageal plaques were found, consistent with                            candidiasis. Biopsied.                           - Tortuous lower esophagus (presbyesophagus).                           - Z-line regular, 40 cm from the incisors.                           - Gastritis. Biopsied.                           -  A single duodenal polyp. Resected and retrieved.                           - Normal duodenal bulb and second portion of the                            duodenum. Recommendation:           - Patient has a contact number available for                            emergencies. The signs and symptoms of potential                            delayed complications were discussed with the                            patient. Return to normal activities tomorrow.                            Written discharge instructions were provided to the                            patient.                           - Soft diet today.                           - Continue present medications.                           - Await pathology results.                           - Use Prilosec (omeprazole) 40 mg PO BID for 8                            weeks.                           - Diflucan (fluconazole) 200 mg PO on day 1 then                            100 mg daily for 3 weeks. #22, RF0. Gerrit Heck, MD 05/22/2018 12:42:10 PM

## 2018-05-22 NOTE — Progress Notes (Signed)
Pre-procedure completed with patient's daughter [name].

## 2018-05-22 NOTE — Patient Instructions (Signed)
Thank you for allowing [place] to care for you today!  Await pathology results by mail, approximately 2 weeks.  Resume previous diet today.  New prescriptions sent the the [facility]  Administration [place], [place]  Return to your normal activities tomorrow.     YOU HAD AN ENDOSCOPIC PROCEDURE TODAY AT [facility]:   Refer to the procedure report that was given to you for any specific questions about what was found during the examination.  If the procedure report does not answer your questions, please call your gastroenterologist to clarify.  If you requested that your care partner not be given the details of your procedure findings, then the procedure report has been included in a sealed envelope for you to review at your convenience later.  YOU SHOULD EXPECT: Some feelings of bloating in the abdomen. Passage of more gas than usual.  Walking can help get rid of the air that was put into your GI tract during the procedure and reduce the bloating. If you had a lower endoscopy (such as a colonoscopy or flexible sigmoidoscopy) you may notice spotting of blood in your stool or on the toilet paper. If you underwent a bowel prep for your procedure, you may not have a normal bowel movement for a few days.  Please Note:  You might notice some irritation and congestion in your nose or some drainage.  This is from the oxygen used during your procedure.  There is no need for concern and it should clear up in a day or so.  SYMPTOMS TO REPORT IMMEDIATELY:     Following upper endoscopy (EGD)  Vomiting of blood or coffee ground material  New chest pain or pain under the shoulder blades  Painful or persistently difficult swallowing  New shortness of breath  Fever of 100F or higher  Black, tarry-looking stools  For urgent or emergent issues, a gastroenterologist can be reached at any hour by calling [phone].   DIET:  We do recommend a small meal at first, but then you may proceed  to your regular diet.  Drink plenty of fluids but you should avoid alcoholic beverages for 24 hours.  ACTIVITY:  You should plan to take it easy for the rest of today and you should NOT DRIVE or use heavy machinery until tomorrow (because of the sedation medicines used during the test).    FOLLOW UP: Our staff will call the number listed on your records the next business day following your procedure to check on you and address any questions or concerns that you may have regarding the information given to you following your procedure. If we do not reach you, we will leave a message.  However, if you are feeling well and you are not experiencing any problems, there is no need to return our call.  We will assume that you have returned to your regular daily activities without incident.  If any biopsies were taken you will be contacted by phone or by letter within the next 1-3 weeks.  Please call us at [phone] if you have not heard about the biopsies in 3 weeks.    SIGNATURES/CONFIDENTIALITY: You and/or your care partner have signed paperwork which will be entered into your electronic medical record.  These signatures attest to the fact that that the information above on your After Visit Summary has been reviewed and is understood.  Full responsibility of the confidentiality of this discharge information lies with you and/or your care-partner.

## 2018-05-22 NOTE — Progress Notes (Signed)
To PACU, VSS. Report to [email or web address] 

## 2018-05-22 NOTE — Progress Notes (Signed)
Called to room to assist during endoscopic procedure.  Patient ID and intended procedure confirmed with present staff. Received instructions for my participation in the procedure from the performing physician.  

## 2018-05-23 ENCOUNTER — Other Ambulatory Visit: Payer: Self-pay

## 2018-05-23 ENCOUNTER — Telehealth: Payer: Self-pay | Admitting: *Deleted

## 2018-05-23 MED ORDER — DIFLUCAN 100 MG PO TABS: 100.0000 mg | ORAL_TABLET | ORAL | 0 refills | Status: DC

## 2018-05-23 NOTE — Telephone Encounter (Signed)
  Follow up Call-  Call back number 05/22/2018  Post procedure Call Back phone  # [phone]  Permission to leave phone message Yes  Some recent data might be hidden     Patient questions:  Do you have a fever, pain , or abdominal swelling? No. Pain Score  0 *  Have you tolerated food without any problems? Yes.    Have you been able to return to your normal activities? Yes.    Do you have any questions about your discharge instructions: Diet   No. Medications  No. Follow up visit  No.  Do you have questions or concerns about your Care? No.  Actions: * If pain score is 4 or above: No action needed, pain <4.

## 2018-05-23 NOTE — Progress Notes (Signed)
Resent Rx to specify medication be taken 2 tablets as a one time first dose and then decrease to 1 tablet daily for 3 weeks.

## 2018-05-26 ENCOUNTER — Encounter: Payer: Self-pay | Admitting: Gastroenterology

## 2018-06-02 ENCOUNTER — Telehealth: Payer: Self-pay | Admitting: Internal Medicine

## 2018-06-02 NOTE — Telephone Encounter (Signed)
Spoke with pt daughter about med changes for pt. Per pt daughter, Dr. Reymundo Poll, 'doctor who makes house calls' was concerned about pt orthostatic BP decrease from 520 systolic to 802 when pt changed positions from sitting to standing. Per pt daughter, Dr. Fredderick Phenix will be decreasing pt Lopressor to 12.5 mg BID effective immediately and will defer adjustments to pt Lasix to Dr. Debara Pickett. Will route to Dr. Debara Pickett for review

## 2018-06-02 NOTE — Telephone Encounter (Signed)
° ° °  Pt c/o medication issue:  1. Name of Medication: metoprolol tartrate (LOPRESSOR) 25 MG tablet and Lasix  2. How are you currently taking this medication (dosage and times per day)? n/a 3. Are you having a reaction (difficulty breathing--STAT)? no  4. What is your medication issue?Patient's daughter [name] [phone] calling to report Dr Reymundo Poll has requested patient change dosage of Lasix and Metoprolol Daughter did not have the full accurate  reading of BP, could only tell scheduler 40-->100 Please call daughter at [phone]

## 2018-06-03 NOTE — Telephone Encounter (Signed)
Called patient, notified of message from [place].  Patient daughter was aware.  Was asked to fax over response to PCP office.

## 2018-06-03 NOTE — Telephone Encounter (Signed)
Thanks .Marland Kitchen If the orthostatic symptoms don't improve with decrease in beta blocker, may have to decrease lasix.  Dr. Lemmie Evens

## 2018-06-18 HISTORY — PX: SKIN SURGERY: SHX2413

## 2018-06-24 ENCOUNTER — Telehealth: Payer: Self-pay

## 2018-06-24 NOTE — Telephone Encounter (Signed)
TELEPHONE CALL NOTE Spoke with pt's daughter is agreeable to change appointment to phone visit,  Troy Hicks has been deemed a candidate for a follow-up tele-health visit to limit community exposure during the Covid-19 pandemic. I spoke with the patient via phone to ensure availability of phone/video source, confirm preferred email & phone number, and discuss instructions and expectations.  I reminded Troy Hicks to be prepared with any vital sign and/or heart rhythm information that could potentially be obtained via home monitoring, at the time of his visit. I reminded Troy Hicks to expect a phone call at the time of his visit if his visit.  Did the patient verbally acknowledge consent to treatment? Consent will be obtained day of visit with pt present.  Meryl Crutch, RN 06/24/2018 1:16 PM   DOWNLOADING THE [place], go to [employer] and type in WebEx in the search bar. [place] [employer], the blue/green circle. The app is free but as with any other app downloads, their phone may require them to verify saved payment information or Apple password. The patient does NOT have to create an account.  - If Android, ask patient to go to [employer] and type in WebEx in the search bar. [place] [employer], the blue/green circle. The app is free but as with any other app downloads, their phone may require them to verify saved payment information or Android password. The patient does NOT have to create an account.   CONSENT FOR TELE-HEALTH VISIT - PLEASE REVIEW  I hereby voluntarily request, consent and authorize [facility] and its employed or contracted physicians, physician assistants, nurse practitioners or other licensed health care professionals (the Practitioner), to provide me with telemedicine health care services (the "Services") as deemed necessary by the treating Practitioner. I acknowledge and consent to receive the Services  by the Practitioner via telemedicine. I understand that the telemedicine visit will involve communicating with the Practitioner through live audiovisual communication technology and the disclosure of certain medical information by electronic transmission. I acknowledge that I have been given the opportunity to request an in-person assessment or other available alternative prior to the telemedicine visit and am voluntarily participating in the telemedicine visit.  I understand that I have the right to withhold or withdraw my consent to the use of telemedicine in the course of my care at any time, without affecting my right to future care or treatment, and that the Practitioner or I may terminate the telemedicine visit at any time. I understand that I have the right to inspect all information obtained and/or recorded in the course of the telemedicine visit and may receive copies of available information for a reasonable fee.  I understand that some of the potential risks of receiving the Services via telemedicine include:  Marland Kitchen Delay or interruption in medical evaluation due to technological equipment failure or disruption; . Information transmitted may not be sufficient (e.g. poor resolution of images) to allow for appropriate medical decision making by the Practitioner; and/or  . In rare instances, security protocols could fail, causing a breach of personal health information.  Furthermore, I acknowledge that it is my responsibility to provide information about my medical history, conditions and care that is complete and accurate to the best of my ability. I acknowledge that Practitioner's advice, recommendations, and/or decision may be based on factors not within their control, such as incomplete or inaccurate data provided by me or distortions of diagnostic images  or specimens that may result from electronic transmissions. I understand that the practice of medicine is not an exact science and that Practitioner  makes no warranties or guarantees regarding treatment outcomes. I acknowledge that I will receive a copy of this consent concurrently upon execution via email to the email address I last provided but may also request a printed copy by calling the office of [place].    I understand that my insurance will be billed for this visit.   I have read or had this consent read to me. . I understand the contents of this consent, which adequately explains the benefits and risks of the Services being provided via telemedicine.  . I have been provided ample opportunity to ask questions regarding this consent and the Services and have had my questions answered to my satisfaction. . I give my informed consent for the services to be provided through the use of telemedicine in my medical care  By participating in this telemedicine visit I agree to the above.

## 2018-06-30 ENCOUNTER — Telehealth: Payer: Self-pay | Admitting: Internal Medicine

## 2018-06-30 NOTE — Telephone Encounter (Signed)
No smartphone/no mychart/prereg completed; consent obtained 04.13.2020/dc

## 2018-07-01 ENCOUNTER — Telehealth: Payer: Medicare Other | Admitting: Internal Medicine

## 2018-07-01 NOTE — Telephone Encounter (Signed)
Spoke with pt's daughter who state pt is currently living in a nursing home and will not be available for tele-visit. Daughter requesting if she could proceed with visit. Informed her that per Dr. Debara Pickett, Pt would have to be present in order for tele-visit to take place. Daughter voice understanding.   Daughter state she wanted to follow up on 3/16 encounter. She report at that time, pt was orthostatic and Dr. Fredderick Phenix decreased pt Lopressor to 12.5 mg BID and deferred lasix dose to Dr. Debara Pickett. Per note, Dr. Debara Pickett recommend continuing with current dose, but if pt orthostatic symptoms did not improve, he may have to decrease lasix. Daughter report she doesn't have a current log of pt's BP but will have facility fax it to our office. She stated pt BP today was 121/62, HR 62, and weight 172. She report pt is having some SOB and had 4 fall in the last 2 1/2-3 months and voiced falls could be related to his age.  Informed daughter, a message would be routed to MD and will await BP log for him to review.

## 2018-07-01 NOTE — Telephone Encounter (Signed)
Left message to call back  

## 2018-07-01 NOTE — Telephone Encounter (Signed)
I will need to review BP's and likely see him and the daughter in the office, if he is able to get out of the nursing home lockdown.  Dr. Lemmie Evens

## 2018-07-01 NOTE — Telephone Encounter (Signed)
Follow up: ° ° ° °Patient returning your call back.  °

## 2018-07-01 NOTE — Telephone Encounter (Signed)
New Message    Patient's daughter calling stating she would like the nurse to call her about the appointment.  States father is in [facility] and she won't be with him for him to be part of the appointment, but she has all the questions so she wants to be called.

## 2018-07-02 NOTE — Telephone Encounter (Signed)
Pt's daughter update and states she will call back to schedule appointment after speaking to facility.

## 2018-07-03 ENCOUNTER — Telehealth: Payer: Self-pay | Admitting: Gastroenterology

## 2018-07-03 MED ORDER — FLUCONAZOLE 200 MG PO TABS: ORAL_TABLET | ORAL | 0 refills | Status: DC

## 2018-07-03 NOTE — Telephone Encounter (Signed)
Patient's daughter called and states patient is still having a lot of difficulty swallowing anything solid, even bread. Sometimes his meds come back up. And he is still losing weight. Please advise

## 2018-07-03 NOTE — Telephone Encounter (Signed)
I spoke with the patient's daughter at length.  He is continuing to have solid food dysphagia.  EGD was notable for Candida esophagitis.  Notes some improvement with recent course of fluconazole but return of index symptoms.  Continuing to lose weight, down to 172 pounds today, which is 9 pounds less than my initial appointment with him in nearly 15 pounds less than baseline.  EGD also notable for presbyesophagus.  Discussed DDX for ongoing symptoms to include presbyesophagus, decreased motility, ongoing Candida esophagitis, and will treat and evaluate as below:  - Given clinical improvement but not resolution with antifungal therapy, will repeat course of fluconazole 400 mg x 1, then 200 mg daily for 13 more days.  Rx for 200 mg tablets.  #15.  Refill 0. - If symptoms persist, plan for Esophageal Manometry when COVID-19 related restrictions are lifted - Pending above, if EM unrevealing, barium esophagram -Did discuss the possibility of having to place feeding tube for nutritional support, with continued p.o. intake for pleasure/comfort.  Otherwise, no symptoms of aspiration or oral pharyngeal dysphagia to suggest referral to Speech Path - RTC in 1 to 2 months or sooner as needed  Robin, Can you please place the above Rx for fluconazole to be picked up at the [place].  Patient's daughter requested that we send attention to Felicita Gage at the [place].  Thank you.

## 2018-07-03 NOTE — Telephone Encounter (Signed)
Script sent to [place] and daughter called to have her talk to pharmacist when she picks it up about possible interaction with Simavastatin and colchicine

## 2018-07-08 ENCOUNTER — Other Ambulatory Visit: Payer: Self-pay

## 2018-07-08 ENCOUNTER — Telehealth: Payer: Self-pay | Admitting: Gastroenterology

## 2018-07-08 MED ORDER — FLUCONAZOLE 200 MG PO TABS: 200.0000 mg | ORAL_TABLET | Freq: Every day | ORAL | 0 refills | Status: DC

## 2018-07-08 NOTE — Telephone Encounter (Signed)
Faxed 07/08/18 @4 :43pm

## 2018-07-08 NOTE — Telephone Encounter (Signed)
Pt daughter called said that the med [facility]) needs to be sent to the primary doctor at the [place]. Patient daughter would like a call when it has been faxed over.   Smitty Pluck WOE:[phone] pharmacy at the [facility]

## 2018-07-11 ENCOUNTER — Telehealth: Payer: Self-pay | Admitting: Gastroenterology

## 2018-07-11 NOTE — Telephone Encounter (Signed)
Beth (daugher of Mr. Brinkmeyer) believes that Dr. Cathleen Corti is doubling the Fluconazole prescription that pt originally was prescibed. She does not think that it should be 200mg ??? I confirmed with her that bother prescriptions that we sent in for her father were 200mg  tablets. I instructed that he is to take 400mg  on the first day and then 200mg  the following days until all of the medication is gone.  She said that she would give him the medication but seemed angry and not agreeable. She did hang the phone up abruptly. Dr. Cathleen Corti, I just wanted to make you aware of the conversation. I am not sure if there is anything further that needs to be addressed. I will be out of the office next week. So please forward to another nurse who can manage. Thank you.

## 2018-07-11 NOTE — Telephone Encounter (Signed)
Patient daughter [name] called would like to know if the dosage for the med is correct.

## 2018-07-11 NOTE — Telephone Encounter (Signed)
Patient wife called said that increase for the med fluconazole (DIFLUCAN) 200 MG is higher than usually. She wants to confirm the dosage

## 2018-07-15 NOTE — Telephone Encounter (Signed)
Thank you for the follow-up. Yes, the original Rx was for 200 mg on day 1, then 100 mg daily for 21 days. This provided improvement, but not complete relief, so the plan was to increase to 400 mg x1, then 200 mg daily, but a shorter 14 day course to try to completely eradicate. If sxs not resolved, can further evaluate with repeat EGD with possible empiric esophageal dilation if the Candida is resolved but sxs linger. Thanks again.

## 2018-08-19 ENCOUNTER — Telehealth: Payer: Self-pay | Admitting: Gastroenterology

## 2018-08-20 ENCOUNTER — Other Ambulatory Visit: Payer: Self-pay

## 2018-08-20 MED ORDER — OMEPRAZOLE 20 MG PO CPDR: 20.0000 mg | DELAYED_RELEASE_CAPSULE | Freq: Every day | ORAL | 5 refills | Status: DC

## 2018-08-20 NOTE — Telephone Encounter (Signed)
Medication has been sent to the [place].

## 2018-08-20 NOTE — Telephone Encounter (Signed)
No problem. He has completed a course of high dose PPI (omeprazole 40 mg PO BID x8 weeks), and agree with continued PPI at lowest effective dose to control reflux symptoms and subsequent associated dysphagia. Ok to send him Rx to the [place] for Omeprazole 20 mg PO daily #90 RF5. Thanks.

## 2018-08-20 NOTE — Telephone Encounter (Signed)
Patients daughter would like her dad to use a maintenance dose of Omeprazole 20mg  daily. His PCP was trying to help with the matter and prescribed Protonix 20mg  on top of his already prescribed Omeprazole 40mg . Beth his daughter states that he is now out of his medication and we will need to D/C it if we are unable to fill a new Rx this morning.

## 2018-11-12 ENCOUNTER — Encounter: Payer: Self-pay | Admitting: Podiatry

## 2018-11-12 ENCOUNTER — Other Ambulatory Visit: Payer: Self-pay

## 2018-11-12 ENCOUNTER — Ambulatory Visit (INDEPENDENT_AMBULATORY_CARE_PROVIDER_SITE_OTHER): Payer: Medicare Other | Admitting: Podiatry

## 2018-11-12 VITALS — BP 145/69 | HR 72

## 2018-11-12 DIAGNOSIS — M79675 Pain in left toe(s): Secondary | ICD-10-CM

## 2018-11-12 DIAGNOSIS — B351 Tinea unguium: Secondary | ICD-10-CM

## 2018-11-12 DIAGNOSIS — Z794 Long term (current) use of insulin: Secondary | ICD-10-CM

## 2018-11-12 DIAGNOSIS — N183 Chronic kidney disease, stage 3 unspecified: Secondary | ICD-10-CM

## 2018-11-12 DIAGNOSIS — M79674 Pain in right toe(s): Secondary | ICD-10-CM

## 2018-11-12 DIAGNOSIS — E1122 Type 2 diabetes mellitus with diabetic chronic kidney disease: Secondary | ICD-10-CM

## 2018-11-12 NOTE — Patient Instructions (Signed)
Diabetes Mellitus and Foot Care Foot care is an important part of your health, especially when you have diabetes. Diabetes may cause you to have problems because of poor blood flow (circulation) to your feet and legs, which can cause your skin to:  Become thinner and drier.  Break more easily.  Heal more slowly.  Peel and crack. You may also have nerve damage (neuropathy) in your legs and feet, causing decreased feeling in them. This means that you may not notice minor injuries to your feet that could lead to more serious problems. Noticing and addressing any potential problems early is the best way to prevent future foot problems. How to care for your feet Foot hygiene  Wash your feet daily with warm water and mild soap. Do not use hot water. Then, pat your feet and the areas between your toes until they are completely dry. Do not soak your feet as this can dry your skin.  Trim your toenails straight across. Do not dig under them or around the cuticle. File the edges of your nails with an emery board or nail file.  Apply a moisturizing lotion or petroleum jelly to the skin on your feet and to dry, brittle toenails. Use lotion that does not contain alcohol and is unscented. Do not apply lotion between your toes. Shoes and socks  Wear clean socks or stockings every day. Make sure they are not too tight. Do not wear knee-high stockings since they may decrease blood flow to your legs.  Wear shoes that fit properly and have enough cushioning. Always look in your shoes before you put them on to be sure there are no objects inside.  To break in new shoes, wear them for just a few hours a day. This prevents injuries on your feet. Wounds, scrapes, corns, and calluses  Check your feet daily for blisters, cuts, bruises, sores, and redness. If you cannot see the bottom of your feet, use a mirror or ask someone for help.  Do not cut corns or calluses or try to remove them with medicine.  If you  find a minor scrape, cut, or break in the skin on your feet, keep it and the skin around it clean and dry. You may clean these areas with mild soap and water. Do not clean the area with peroxide, alcohol, or iodine.  If you have a wound, scrape, corn, or callus on your foot, look at it several times a day to make sure it is healing and not infected. Check for: ? Redness, swelling, or pain. ? Fluid or blood. ? Warmth. ? Pus or a bad smell. General instructions  Do not cross your legs. This may decrease blood flow to your feet.  Do not use heating pads or hot water bottles on your feet. They may burn your skin. If you have lost feeling in your feet or legs, you may not know this is happening until it is too late.  Protect your feet from hot and cold by wearing shoes, such as at the beach or on hot pavement.  Schedule a complete foot exam at least once a year (annually) or more often if you have foot problems. If you have foot problems, report any cuts, sores, or bruises to your health care provider immediately. Contact a health care provider if:  You have a medical condition that increases your risk of infection and you have any cuts, sores, or bruises on your feet.  You have an injury that is not   healing.  You have redness on your legs or feet.  You feel burning or tingling in your legs or feet.  You have pain or cramps in your legs and feet.  Your legs or feet are numb.  Your feet always feel cold.  You have pain around a toenail. Get help right away if:  You have a wound, scrape, corn, or callus on your foot and: ? You have pain, swelling, or redness that gets worse. ? You have fluid or blood coming from the wound, scrape, corn, or callus. ? Your wound, scrape, corn, or callus feels warm to the touch. ? You have pus or a bad smell coming from the wound, scrape, corn, or callus. ? You have a fever. ? You have a red line going up your leg. Summary  Check your feet every day  for cuts, sores, red spots, swelling, and blisters.  Moisturize feet and legs daily.  Wear shoes that fit properly and have enough cushioning.  If you have foot problems, report any cuts, sores, or bruises to your health care provider immediately.  Schedule a complete foot exam at least once a year (annually) or more often if you have foot problems. This information is not intended to replace advice given to you by your health care provider. Make sure you discuss any questions you have with your health care provider. Document Released: 03/02/2000 Document Revised: 04/17/2017 Document Reviewed: 04/06/2016 Elsevier Patient Education  2020 Elsevier Inc.   Onychomycosis/Fungal Toenails  WHAT IS IT? An infection that lies within the keratin of your nail plate that is caused by a fungus.  WHY ME? Fungal infections affect all ages, sexes, races, and creeds.  There may be many factors that predispose you to a fungal infection such as age, coexisting medical conditions such as diabetes, or an autoimmune disease; stress, medications, fatigue, genetics, etc.  Bottom line: fungus thrives in a warm, moist environment and your shoes offer such a location.  IS IT CONTAGIOUS? Theoretically, yes.  You do not want to share shoes, nail clippers or files with someone who has fungal toenails.  Walking around barefoot in the same room or sleeping in the same bed is unlikely to transfer the organism.  It is important to realize, however, that fungus can spread easily from one nail to the next on the same foot.  HOW DO WE TREAT THIS?  There are several ways to treat this condition.  Treatment may depend on many factors such as age, medications, pregnancy, liver and kidney conditions, etc.  It is best to ask your doctor which options are available to you.  1. No treatment.   Unlike many other medical concerns, you can live with this condition.  However for many people this can be a painful condition and may lead to  ingrown toenails or a bacterial infection.  It is recommended that you keep the nails cut short to help reduce the amount of fungal nail. 2. Topical treatment.  These range from herbal remedies to prescription strength nail lacquers.  About 40-50% effective, topicals require twice daily application for approximately 9 to 12 months or until an entirely new nail has grown out.  The most effective topicals are medical grade medications available through physicians offices. 3. Oral antifungal medications.  With an 80-90% cure rate, the most common oral medication requires 3 to 4 months of therapy and stays in your system for a year as the new nail grows out.  Oral antifungal medications do require   blood work to make sure it is a safe drug for you.  A liver function panel will be performed prior to starting the medication and after the first month of treatment.  It is important to have the blood work performed to avoid any harmful side effects.  In general, this medication safe but blood work is required. 4. Laser Therapy.  This treatment is performed by applying a specialized laser to the affected nail plate.  This therapy is noninvasive, fast, and non-painful.  It is not covered by insurance and is therefore, out of pocket.  The results have been very good with a 80-95% cure rate.  The [facility] is the only practice in the area to offer this therapy. 5. Permanent Nail Avulsion.  Removing the entire nail so that a new nail will not grow back. 

## 2018-11-20 ENCOUNTER — Encounter: Payer: Self-pay | Admitting: Podiatry

## 2018-11-20 NOTE — Progress Notes (Signed)
Subjective: Troy Hicks presents today referred by Reymundo Poll, MD for diabetic foot evaluation.  Patient relates 45 year history of diabetes.  Patient denies any history of foot wounds.  Patient denies any history of numbness, tingling, burning, pins/needles sensations.  Today, patient c/o of painful, discolored, thick toenails which interfere with daily activities.  Pain is aggravated when wearing enclosed shoe gear.   Past Medical History:  Diagnosis Date  . Chronic kidney disease (CKD), stage III (moderate) ([place])    Per [facility]   . Coronary artery disease    Per [facility]   . Edema    Per [facility]   . Gait instability    Per [facility]   . Gout    Per [facility]   . Hypertension    Per [facility]   . Insulin dependent diabetes mellitus [facility])    Per [facility] Packet,[facility]   . Osteoarthritis    Per [facility]   . Syncope 2015   Per [facility] Packet,[facility]   . TIA (transient ischemic attack) 2010   Per [facility] [place]     Patient Active Problem List   Diagnosis Date Noted  . Shortness of breath 04/10/2018  . Ischemic cardiomyopathy 04/10/2018    Past Surgical History:  Procedure Laterality Date  . CORONARY ARTERY BYPASS GRAFT  1990   Per [facility]   . IVC FILTER INSERTION     Per [facility]   . PILONIDAL CYST EXCISION  1944   Per [facility] Packet,[facility]   . TONSILLECTOMY     Per [facility] Patient Packet,[facility] (childhood)      Current Outpatient Medications:  .  acetaminophen (TYLENOL) 500 MG tablet, Take 500 mg by mouth every 6 (six) hours as needed., Disp: , Rfl:  .  Alogliptin Benzoate 25 MG TABS, Take 1 tablet by mouth daily., Disp: , Rfl:  .  aspirin EC 81 MG tablet, Take 81 mg by mouth daily., Disp: , Rfl:  .  Colchicine 0.6 MG CAPS, Take 1 capsule by mouth as directed., Disp: , Rfl:  .  fluconazole (DIFLUCAN) 200 MG tablet, Take 2 tabs on  day one then 1 tab daily until gone., Disp: 15 tablet, Rfl: 0 .  fluconazole (DIFLUCAN) 200 MG tablet, Take 1 tablet (200 mg total) by mouth daily., Disp: 15 tablet, Rfl: 0 .  furosemide (LASIX) 80 MG tablet, Take 80 mg by mouth daily., Disp: , Rfl:  .  insulin glargine (LANTUS) 100 UNIT/ML injection, Inject 5 Units into the skin at bedtime., Disp: , Rfl:  .  levothyroxine (SYNTHROID, LEVOTHROID) 100 MCG tablet, Take 100 mcg by mouth daily before breakfast., Disp: , Rfl:  .  metoprolol tartrate (LOPRESSOR) 25 MG tablet, Take 25 mg by mouth 2 (two) times daily., Disp: , Rfl: 0 .  Multiple Vitamins-Minerals (PRESERVISION AREDS) CAPS, Take 1 capsule by mouth 2 (two) times daily., Disp: , Rfl:  .  omeprazole (PRILOSEC) 20 MG capsule, Take 1 capsule (20 mg total) by mouth daily., Disp: 90 capsule, Rfl: 5 .  senna (SENOKOT) 8.6 MG TABS tablet, Take 1 tablet by mouth every Monday, Wednesday, and Friday., Disp: , Rfl:  .  sertraline (ZOLOFT) 25 MG tablet, Take 25 mg by mouth daily., Disp: , Rfl:  .  simvastatin (ZOCOR) 80 MG tablet, Take 80 mg by mouth daily., Disp: , Rfl:  .  traMADol (ULTRAM) 50 MG tablet, Take 25 mg  by mouth 2 (two) times daily., Disp: , Rfl:  .  traZODone (DESYREL) 50 MG tablet, Take 50 mg by mouth at bedtime., Disp: , Rfl:   No Known Allergies  Social History   Occupational History  . Not on file  Tobacco Use  . Smoking status: Former Smoker    Packs/day: 0.50    Years: 15.00    Pack years: 7.50    Quit date: 03/19/1957    Years since quitting: 61.7  . Smokeless tobacco: Never Used  . Tobacco comment: 60 years ago as of 2019   Substance and Sexual Activity  . Alcohol use: Yes    Comment: 1 drink per day  . Drug use: Not Currently  . Sexual activity: Not on file    Family History  Problem Relation Age of Onset  . Diabetes Mother   . Heart disease Mother   . Throat cancer Father   . Cirrhosis Son   . Alcoholism Son   . Ovarian cancer Daughter   . Depression  Daughter   . Alcoholism Daughter   . Colon cancer Neg Hx     Immunization History  Administered Date(s) Administered  . Influenza-Unspecified 01/02/2017  . Pneumococcal Conjugate-13 03/19/2013  . Zoster 03/19/2009    Review of systems: Positive Findings in bold print.  Constitutional:  chills, fatigue, fever, sweats, weight change Communication: Optometrist, sign Ecologist, hand writing, iPad/Android device Head: headaches, head injury Eyes: changes in vision, eye pain, glaucoma, cataracts, macular degeneration, diplopia, glare,  light sensitivity, eyeglasses or contacts, blindness Ears nose mouth throat: hearing impaired, hearing aids,  ringing in ears, deaf, sign language,  vertigo, nosebleeds,  rhinitis,  cold sores, snoring, swollen glands Cardiovascular: HTN, edema, arrhythmia, pacemaker in place, defibrillator in place, chest pain/tightness, chronic anticoagulation, blood clot, heart failure, MI Peripheral Vascular: leg cramps, varicose veins, blood clots, lymphedema, varicosities Respiratory:  difficulty breathing, denies congestion, SOB, wheezing, cough, emphysema Gastrointestinal: change in appetite or weight, abdominal pain, constipation, diarrhea, nausea, vomiting, vomiting blood, change in bowel habits, abdominal pain, jaundice, rectal bleeding, hemorrhoids, GERD Genitourinary:  nocturia,  pain on urination, polyuria,  blood in urine, Foley catheter, urinary urgency, ESRD on hemodialysis Musculoskeletal: amputation, cramping, stiff joints, painful joints, decreased joint motion, fractures, OA, gout, hemiplegia, paraplegia, uses cane, wheelchair bound, uses walker, uses rollator Skin: +changes in toenails, color change, dryness, itching, mole changes,  rash, wound(s) Neurological: headaches, numbness in feet, paresthesias in feet, burning in feet, fainting,  seizures, change in speech,  headaches, memory problems/poor historian, cerebral palsy, weakness, paralysis, CVA,  TIA Endocrine: diabetes, hypothyroidism, hyperthyroidism,  goiter, dry mouth, flushing, heat intolerance,  cold intolerance,  excessive thirst, denies polyuria,  nocturia Hematological:  easy bleeding, excessive bleeding, easy bruising, enlarged lymph nodes, on long term blood thinner, history of past transusions Allergy/immunological:  hives, eczema, frequent infections, multiple drug allergies, seasonal allergies, transplant recipient, multiple food allergies Psychiatric:  anxiety, depression, mood disorder, suicidal ideations, hallucinations, insomnia  Objective: Vitals:   11/12/18 1530  BP: (!) 145/69  Pulse: 72   Vascular Examination: Capillary refill time immediate x 10 digits  Dorsalis pedis pulses faintly palpable b/l.  Posterior tibial pulses faintly palpable b/l.  Digital hair absent x 10 digits  Skin temperature gradient WNL b/l.  Dermatological Examination: Skin with normal turgor, texture and tone b/l.  Toenails 1-5 b/l discolored, thick, dystrophic with subungual debris and pain with palpation to nailbeds due to thickness of nails.  Musculoskeletal: Muscle strength 5/5 to all LE muscle  groups.  Neurological: Sensation intact 5/5 b/l with 10 gram monofilament.  Vibratory sensation intact b/l.  Assessment: 1. Painful onychomycosis toenails 1-5 b/l  2. NIDDM  Plan: 1. Discussed diabetic foot care principles. Literature dispensed on today. 2. Toenails 1-5 b/l were debrided in length and girth without iatrogenic bleeding. 3. Patient to continue soft, supportive shoe gear 4. Patient to report any pedal injuries to medical professional immediately. 5. Follow up 3 months.  6. Patient/POA to call should there be a concern in the interim.

## 2018-11-25 DIAGNOSIS — M25511 Pain in right shoulder: Secondary | ICD-10-CM | POA: Insufficient documentation

## 2018-11-25 DIAGNOSIS — M25529 Pain in unspecified elbow: Secondary | ICD-10-CM | POA: Insufficient documentation

## 2018-11-25 DIAGNOSIS — M25512 Pain in left shoulder: Secondary | ICD-10-CM | POA: Insufficient documentation

## 2018-11-26 DIAGNOSIS — M19029 Primary osteoarthritis, unspecified elbow: Secondary | ICD-10-CM | POA: Insufficient documentation

## 2018-11-26 DIAGNOSIS — M19019 Primary osteoarthritis, unspecified shoulder: Secondary | ICD-10-CM | POA: Insufficient documentation

## 2019-02-10 ENCOUNTER — Ambulatory Visit: Payer: Medicare Other | Admitting: Podiatry

## 2019-03-23 ENCOUNTER — Telehealth: Payer: Self-pay | Admitting: *Deleted

## 2019-03-23 NOTE — Telephone Encounter (Signed)
Daughter called  - the patient would like to have a cortisone [facility] Arlee Muslim ( [facility])wanted daughter to ask fDr Hilty from a cardiac standpoint if this was okay.    Daughter is aware will defer to Dr Debara Pickett and contact her back.

## 2019-03-24 NOTE — Telephone Encounter (Signed)
Information given to daughter. She thanked  Therapist, sports and states she will get it schedule

## 2019-03-24 NOTE — Telephone Encounter (Signed)
Ok for shoulder injection from cardiac standpoint.  Dr Lemmie Evens

## 2019-04-01 ENCOUNTER — Ambulatory Visit: Payer: Medicare Other | Admitting: Podiatry

## 2019-05-14 ENCOUNTER — Ambulatory Visit: Payer: Medicare Other | Admitting: Nurse Practitioner

## 2019-05-14 ENCOUNTER — Other Ambulatory Visit: Payer: Self-pay

## 2019-05-14 ENCOUNTER — Encounter: Payer: Self-pay | Admitting: Nurse Practitioner

## 2019-05-14 ENCOUNTER — Telehealth: Payer: Self-pay | Admitting: Nurse Practitioner

## 2019-05-14 ENCOUNTER — Other Ambulatory Visit (INDEPENDENT_AMBULATORY_CARE_PROVIDER_SITE_OTHER): Payer: Medicare Other

## 2019-05-14 VITALS — BP 134/62 | HR 86 | Temp 97.8°F | Ht 66.0 in | Wt 180.2 lb

## 2019-05-14 DIAGNOSIS — R197 Diarrhea, unspecified: Secondary | ICD-10-CM

## 2019-05-14 DIAGNOSIS — K612 Anorectal abscess: Secondary | ICD-10-CM

## 2019-05-14 DIAGNOSIS — K610 Anal abscess: Secondary | ICD-10-CM

## 2019-05-14 LAB — CBC WITH DIFFERENTIAL/PLATELET
Basophils Absolute: 0 10*3/uL (ref 0.0–0.1)
Basophils Relative: 0.6 % (ref 0.0–3.0)
Eosinophils Absolute: 0.2 10*3/uL (ref 0.0–0.7)
Eosinophils Relative: 1.8 % (ref 0.0–5.0)
HCT: 36.3 % — ABNORMAL LOW (ref 39.0–52.0)
Hemoglobin: 12.2 g/dL — ABNORMAL LOW (ref 13.0–17.0)
Lymphocytes Relative: 17.3 % (ref 12.0–46.0)
Lymphs Abs: 1.5 10*3/uL (ref 0.7–4.0)
MCHC: 33.6 g/dL (ref 30.0–36.0)
MCV: 97.8 fl (ref 78.0–100.0)
Monocytes Absolute: 0.8 10*3/uL (ref 0.1–1.0)
Monocytes Relative: 9.2 % (ref 3.0–12.0)
Neutro Abs: 6.2 10*3/uL (ref 1.4–7.7)
Neutrophils Relative %: 71.1 % (ref 43.0–77.0)
Platelets: 100 10*3/uL — ABNORMAL LOW (ref 150.0–400.0)
RBC: 3.71 Mil/uL — ABNORMAL LOW (ref 4.22–5.81)
RDW: 14.7 % (ref 11.5–15.5)
WBC: 8.7 10*3/uL (ref 4.0–10.5)

## 2019-05-14 LAB — C-REACTIVE PROTEIN: CRP: 2.1 mg/dL (ref 0.5–20.0)

## 2019-05-14 NOTE — Progress Notes (Signed)
05/14/2019 Troy Hicks [id number] Aug 14, 1919   Chief Complaint:  Rectal bleeding   History of Present Illness: Troy Hicks is a 86 year old male past medical history of hypertension, artery disease MI x 2, s/p CABG 1990, LV EF 30-35%, TIA 2010, syncope 2015, diabetes mellitus type 2, chronic kidney disease stage III, gout, s/p IVC filter (he stated was placed to prevent clot formation).  Past pilonidal cyst surgery. He presents today accompanied by his daughter, [name]. He is very hard of hearing and [name] is facilitating communication and with obtaining his history. He resides at [facility] assisted living. He presents today with complaints of having a cyst near his rectum that swells then busts open and bleeds every 6 weeks or so, possibly started 6 to 12 months ago. No associated anal or rectal pain. He complains of having watery diarrhea for the past 3 to 4 days, he has soiled himself on a few occasions. His daughter reported he had uncontrolled diarrhea 3 to 4 months ago which improved after he was started on Colestid once daily. No bloody diarrhea. No upper or lower abdominal pain. He complains of generalized weakness. No fever. He took Doxycycline for 2 weeks possibly 2 to 3 months ago. He eats cereal with milk most mornings otherwise limited dairy intake. He thinks he has lost weight as his pants are looser around his waist. He underwent 3 or 4 colonoscopies in his lifetime, his last colonoscopy was 25 years ago. No history of colon polyps. History of dysphagia, EGD 05/2018 showed candidiasis esophagitis. No current dysphagia symptoms.   Weight today 180lbs. Weight 181 lbs on 05/15/2018.    EGD 05/22/2018 by Dr. Bryan Lemma: - Esophageal plaques were found, consistent with candidiasis. Biopsies showed candidiasis.  - Tortuous lower esophagus (presbyesophagus). - Z-line regular, 40 cm from the incisors. - Gastritis. Biopsies showed reactive gastropathy. No H. Pylori.  - A single duodenal polyp.  Biopsies showed nodular peptic duodenitis. - Normal duodenal bulb and second portion of the duodenum.  ECHO 04/17/2018: 1. The left ventricle has moderate-severely reduced systolic function of  01-60%. The cavity size is moderately increased. There is no left  ventricular wall thickness. Echo evidence of impaired relaxation diastolic  filling patterns.  2. Mildly dilated left atrial size.  3. Mildly dilated right atrial size.  4. The mitral valve is degenerative. There is mild thickening and mildly  calcified. Regurgitation is mild by color flow Doppler.  5. Normal tricuspid valve.  6. Tricuspid regurgitation is mild.  7. The aortic valve tricuspid. There is moderate thickening and sclerosis  without any evidence of stenosis of the aortic valve.  8. Pulmonic valve regurgitation is mild by color flow Doppler.  9. No atrial level shunt detected by color flow Doppler.   Current Outpatient Medications on File Prior to Visit  Medication Sig Dispense Refill  . acetaminophen (TYLENOL) 500 MG tablet Take 500 mg by mouth 2 (two) times daily.     Marland Kitchen allopurinol (ZYLOPRIM) 100 MG tablet Take 100 mg by mouth daily.    . Alogliptin Benzoate 25 MG TABS Take 1 tablet by mouth daily.    Marland Kitchen aspirin EC 81 MG tablet Take 81 mg by mouth daily.    . Colchicine 0.6 MG CAPS Take 1 capsule by mouth as directed. Give one tablet by mouth as needed for gout take 1 tablet at first sign of gout then take 1 tablet 2 hours later. Only give every 14 days due to kidney functions    .  colestipol (COLESTID) 1 g tablet Take 1 g by mouth daily.    Marland Kitchen Dextran 70-Hypromellose, PF, 0.1-0.3 % SOLN Apply 1 drop to eye every 4 (four) hours as needed (apply to both eyes).    . furosemide (LASIX) 80 MG tablet Take 80 mg by mouth daily.    . insulin glargine (LANTUS) 100 UNIT/ML injection Inject 15 Units into the skin at bedtime.     Marland Kitchen levothyroxine (SYNTHROID, LEVOTHROID) 100 MCG tablet Take 100 mcg by mouth daily before  breakfast.    . loperamide (IMODIUM) 2 MG capsule Take 2 mg by mouth as needed for diarrhea or loose stools.    Marland Kitchen losartan (COZAAR) 25 MG tablet Take 50 mg by mouth daily.    . Multiple Vitamins-Minerals (PRESERVISION AREDS) CAPS Take 1 capsule by mouth 2 (two) times daily.    . Potassium Chloride Crys ER (KLOR-CON M20 PO) Take 1 tablet by mouth daily.    . sertraline (ZOLOFT) 25 MG tablet Take 25 mg by mouth daily.    . traMADol (ULTRAM) 50 MG tablet Take 50 mg by mouth every 8 (eight) hours as needed.    . traZODone (DESYREL) 50 MG tablet Take 50 mg by mouth at bedtime.     No current facility-administered medications on file prior to visit.   Allergies  Allergen Reactions  . Codeine     Current Medications, Allergies, Past Medical History, Past Surgical History, Family History and Social History were reviewed in [employer] record.   Physical Exam: BP 134/62   Pulse 86   Temp 97.8 F (36.6 C)   Ht 5\' 6"  (1.676 m)   Wt 180 lb 4 oz (81.8 kg)   BMI 29.09 kg/m  General: Well developed 86 year old male HOH in no acute distress. Head: Normocephalic and atraumatic. Eyes:  No scleral icterus. Conjunctiva pink . Ears: Normal auditory acuity. Lungs: Clear throughout to auscultation. Heart: Irregular rhythm, no murmur. Abdomen: Soft, nontender and nondistended. No masses or hepatomegaly. Normal bowel sounds x 4 quadrants. Subcutaneous fatty mass left mid abdomen approximately 3 cm x 2 cm (patient reported site of past insulin injections). Rectal: Thickened anal folds, right anterior anal area with a small area of thickened mucosa without palpable fluid collection (site where patient reports having a cyst that swells and bleeds). No obvious fistula. Soft light brown stool guaiac negative. Enlarged prostate without other rectal mass.  Musculoskeletal: Symmetrical with no gross deformities. Extremities: No edema. Neurological: Alert oriented x 4. No focal deficits.    Psychological:  Alert and cooperative. Normal mood and affect  Assessment and Recommendations:  46. 86 year old male with a right anterior anorectal abscess that appears to be resolving at this time. No fluid collection to drain at this time.  -CBC, CMP and CRP -Refer to general surgeon for further evaluation as the patient reports recurrent perianal fluid collection  2. Diarrhea -GI pathogen -Pancreatic elastase level  -Florastor probiotic 1 po bid, if not affordable then purchase the least expensive probiotic take once daily.  -Call our office if symptoms worsens. -Follow up PRN  3. DM II  4. CAD, past CABG, LV EF 30-35%

## 2019-05-14 NOTE — Patient Instructions (Signed)
If you are age 84 or older, your body mass index should be between 23-30. Your Body mass index is 29.09 kg/m. If this is out of the aforementioned range listed, please consider follow up with your Primary Care Provider.  If you are age 20 or younger, your body mass index should be between 19-25. Your Body mass index is 29.09 kg/m. If this is out of the aformentioned range listed, please consider follow up with your Primary Care Provider.   Your provider has requested that you go to the basement level for lab work before leaving today. Press "B" on the elevator. The lab is located at the first door on the left as you exit the elevator.  We are sending a referral to [facility] surgery. They will call you with an appointment.  Please use  Florastor probiotic 1 tablet twice a day.  Thank you for choosing [facility] Noralyn Pick, CRNP

## 2019-05-14 NOTE — Telephone Encounter (Signed)
Pt's daughter called back and requested that any office notes and medication changes to be faxed back to PCP Welford Roche, NP and to Dallie Dad at the [facility] (fax [phone]).  She also stated that probiotics prescribed has to state duration.

## 2019-05-15 ENCOUNTER — Other Ambulatory Visit: Payer: Self-pay | Admitting: General Surgery

## 2019-05-15 MED ORDER — SACCHAROMYCES BOULARDII 250 MG PO CAPS: 250.0000 mg | ORAL_CAPSULE | Freq: Two times a day (BID) | ORAL | 0 refills | Status: DC

## 2019-05-15 NOTE — Telephone Encounter (Signed)
Place order in system for Florastor and sent to the patients assisted living.

## 2019-05-19 LAB — GASTROINTESTINAL PATHOGEN PANEL PCR
C. difficile Tox A/B, PCR: NOT DETECTED
Campylobacter, PCR: NOT DETECTED
Cryptosporidium, PCR: NOT DETECTED
E coli (ETEC) LT/ST PCR: NOT DETECTED
E coli (STEC) stx1/stx2, PCR: NOT DETECTED
E coli 0157, PCR: NOT DETECTED
Giardia lamblia, PCR: NOT DETECTED
Norovirus, PCR: NOT DETECTED
Rotavirus A, PCR: NOT DETECTED
Salmonella, PCR: NOT DETECTED
Shigella, PCR: NOT DETECTED

## 2019-05-20 ENCOUNTER — Telehealth: Payer: Self-pay | Admitting: Nurse Practitioner

## 2019-05-20 ENCOUNTER — Ambulatory Visit: Payer: Medicare Other | Admitting: Podiatry

## 2019-05-20 ENCOUNTER — Other Ambulatory Visit: Payer: Self-pay

## 2019-05-20 DIAGNOSIS — R1319 Other dysphagia: Secondary | ICD-10-CM

## 2019-05-20 DIAGNOSIS — R197 Diarrhea, unspecified: Secondary | ICD-10-CM

## 2019-05-20 DIAGNOSIS — R1310 Dysphagia, unspecified: Secondary | ICD-10-CM

## 2019-05-20 NOTE — Telephone Encounter (Signed)
Please see result note for further documentation

## 2019-05-21 NOTE — Progress Notes (Signed)
Agree with the assessment and plan as outlined by Carl Best, NP.

## 2019-05-21 NOTE — Telephone Encounter (Signed)
Pt daughter wanted you to know she will be bringing pt tomorrow for labs

## 2019-05-25 ENCOUNTER — Other Ambulatory Visit (INDEPENDENT_AMBULATORY_CARE_PROVIDER_SITE_OTHER): Payer: Medicare Other

## 2019-05-25 DIAGNOSIS — R1319 Other dysphagia: Secondary | ICD-10-CM

## 2019-05-25 DIAGNOSIS — R1310 Dysphagia, unspecified: Secondary | ICD-10-CM

## 2019-05-25 DIAGNOSIS — R197 Diarrhea, unspecified: Secondary | ICD-10-CM | POA: Diagnosis not present

## 2019-05-25 LAB — CBC WITH DIFFERENTIAL/PLATELET
Basophils Absolute: 0.1 10*3/uL (ref 0.0–0.1)
Basophils Relative: 1.1 % (ref 0.0–3.0)
Eosinophils Absolute: 0.2 10*3/uL (ref 0.0–0.7)
Eosinophils Relative: 2 % (ref 0.0–5.0)
HCT: 36.5 % — ABNORMAL LOW (ref 39.0–52.0)
Hemoglobin: 12.3 g/dL — ABNORMAL LOW (ref 13.0–17.0)
Lymphocytes Relative: 18.7 % (ref 12.0–46.0)
Lymphs Abs: 1.4 10*3/uL (ref 0.7–4.0)
MCHC: 33.6 g/dL (ref 30.0–36.0)
MCV: 98 fl (ref 78.0–100.0)
Monocytes Absolute: 0.8 10*3/uL (ref 0.1–1.0)
Monocytes Relative: 9.9 % (ref 3.0–12.0)
Neutro Abs: 5.2 10*3/uL (ref 1.4–7.7)
Neutrophils Relative %: 68.3 % (ref 43.0–77.0)
Platelets: 94 10*3/uL — ABNORMAL LOW (ref 150.0–400.0)
RBC: 3.73 Mil/uL — ABNORMAL LOW (ref 4.22–5.81)
RDW: 14.9 % (ref 11.5–15.5)
WBC: 7.6 10*3/uL (ref 4.0–10.5)

## 2019-05-25 LAB — FOLATE: Folate: 17.7 ng/mL (ref >5.9)

## 2019-05-25 LAB — COMPREHENSIVE METABOLIC PANEL
ALT: 8 U/L (ref 0–53)
AST: 15 U/L (ref 0–37)
Albumin: 3.4 g/dL — ABNORMAL LOW (ref 3.5–5.2)
Alkaline Phosphatase: 104 U/L (ref 39–117)
BUN: 37 mg/dL — ABNORMAL HIGH (ref 6–23)
CO2: 23 mEq/L (ref 19–32)
Calcium: 8.6 mg/dL (ref 8.4–10.5)
Chloride: 103 mEq/L (ref 96–112)
Creatinine, Ser: 1.89 mg/dL — ABNORMAL HIGH (ref 0.40–1.50)
GFR: 33.13 mL/min — ABNORMAL LOW (ref >60.00)
Glucose, Bld: 258 mg/dL — ABNORMAL HIGH (ref 70–99)
Potassium: 5 mEq/L (ref 3.5–5.1)
Sodium: 135 mEq/L (ref 135–145)
Total Bilirubin: 0.4 mg/dL (ref 0.2–1.2)
Total Protein: 6.9 g/dL (ref 6.0–8.3)

## 2019-05-25 LAB — VITAMIN B12: Vitamin B-12: 484 pg/mL (ref 211–911)

## 2019-05-26 LAB — IRON,TIBC AND FERRITIN PANEL
%SAT: 40 % (calc) (ref 20–48)
Ferritin: 276 ng/mL (ref 24–380)
Iron: 102 ug/dL (ref 50–180)
TIBC: 257 mcg/dL (calc) (ref 250–425)

## 2019-06-01 LAB — PANCREATIC ELASTASE, FECAL: Pancreatic Elastase-1, Stool: 34 mcg/g — ABNORMAL LOW

## 2019-06-02 ENCOUNTER — Telehealth: Payer: Self-pay | Admitting: Nurse Practitioner

## 2019-06-02 NOTE — Progress Notes (Signed)
Referral has not been received from [place] on 05/14/2019 to [facility]- referral faxed at this time;

## 2019-06-02 NOTE — Telephone Encounter (Signed)
Called and spoke with patient's daughter-Beth-verified DPR-Beth is wanting to know the results of ALL blood and stool tests-as "they had to be recollected due to the order not being put in the computer right apparently"; [name] is also concerned that the referral had not been received per the conversation with [facility]; referral has been re-faxed and is in process per [facility] receptionist; [facility] to notify Beth of appt date/time;  Beth advised to call back to the office at [phone] should questions/concerns arise;  Beth verbalized understanding of information/instructions;

## 2019-06-02 NOTE — Telephone Encounter (Signed)
Patients daughter calling- has not heard from [facility] and she is wanting to follow up about lab results.

## 2019-06-03 ENCOUNTER — Other Ambulatory Visit: Payer: Self-pay

## 2019-06-03 DIAGNOSIS — K8689 Other specified diseases of pancreas: Secondary | ICD-10-CM

## 2019-06-03 MED ORDER — PANCRELIPASE (LIP-PROT-AMYL) 36000-114000 UNITS PO CPEP: ORAL_CAPSULE | ORAL | 2 refills | Status: DC

## 2019-06-03 NOTE — Telephone Encounter (Signed)
See note from Dr. Bryan Lemma.

## 2019-06-04 NOTE — Telephone Encounter (Signed)
I called the patient's daughter, [name] this am as I was not in the office 3/17. My message included labs showed evidence of pancreatic insufficiency therefore Dr. Bryan Lemma ordered pancreatic enzymes and abdominal MRI for further evaluation.  I advised Beth to call me back today and I will gladly review all of her father's lab results with her.

## 2019-06-05 ENCOUNTER — Telehealth: Payer: Self-pay | Admitting: Nurse Practitioner

## 2019-06-05 ENCOUNTER — Other Ambulatory Visit: Payer: Self-pay

## 2019-06-05 DIAGNOSIS — K8689 Other specified diseases of pancreas: Secondary | ICD-10-CM

## 2019-06-05 MED ORDER — PANCRELIPASE (LIP-PROT-AMYL) 36000-114000 UNITS PO CPEP: ORAL_CAPSULE | ORAL | 2 refills | Status: DC

## 2019-06-05 NOTE — Telephone Encounter (Signed)
Call[facility] and [facility] at [facility]-fax number received as [phone];  Requested paperwork has been faxed at this time;

## 2019-06-05 NOTE — Telephone Encounter (Signed)
Troy Hicks from [facility] called and requested previous OV notes and prescription faxed to [place].  Please return her call.

## 2019-06-08 NOTE — Telephone Encounter (Signed)
Pt's wife inquired whether pt can discontinue probiotic and colestipol.  She also requested to remove the [facility] pharmacy fax number from chart.  She said that the only fax number to use for the [place] should be [phone].

## 2019-06-08 NOTE — Telephone Encounter (Signed)
Please review previous message and advise on medication changes

## 2019-06-09 NOTE — Telephone Encounter (Signed)
If pt is no longer having diarrhea ok to stop colestipol and if no diarrhea in 2 weeks ok to stop probiotic. thx

## 2019-06-10 NOTE — Telephone Encounter (Signed)
Called and spoke with patient's daughter-Troy Hicks-verified DPR-Troy Hicks informed of provider's recommendations; Bethis agreeable with plan of care; Troy Hicks verbalized understanding of information/instructions;  Troy Hicks was advised to call the office at [phone] if questions/concerns arise;

## 2019-06-15 ENCOUNTER — Ambulatory Visit: Payer: Self-pay | Admitting: General Surgery

## 2019-06-15 NOTE — H&P (Signed)
The patient is a 86 year old male who presents with a complaint of anal problems. 86 year old male who presents to the office with recurrent perirectal abscess. This is been going on for approximately 5-6 months. Due to the recurrent nature of the abscess. He was referred to me for evaluation for an anal fistula. Patient does have a history of a pilonidal abscess in his 39s, which was treated surgically.   Past Surgical History Geni Bers [place], RMA; 06/15/2019 12:10 PM) Anal Fissure Repair Bypass Surgery for Poor Blood Flow to Legs Cataract Surgery Bilateral. Coronary Artery Bypass Graft Tonsillectomy  Diagnostic Studies History Geni Bers [place], RMA; 06/15/2019 12:10 PM) Colonoscopy >10 years ago  Allergies Geni Bers [place], RMA; 06/15/2019 11:18 AM) Codeine and Related Allergies Reconciled  Medication History (Jacqueline Haggett, RMA; 06/15/2019 11:21 AM) Creon (Oral) Specific strength unknown - Active. Tylenol (500MG  Capsule, Oral) Active. Zyloprim (100MG  Tablet, Oral) Active. Alogliptin Benzoate (25MG  Tablet, Oral) Active. Aspirin (81MG  Tablet DR, Oral) Active. Colchicine (0.6MG  Capsule, Oral) Active. Colestid (1GM Tablet, Oral) Active. Dextran 70-Hypromellose (0.1-0.3% Solution, Ophthalmic) Active. Lasix (80MG  Tablet, Oral) Active. Lantus (100UNIT/ML Solution, Subcutaneous) Active. Levothyroxine Sodium (100MCG Capsule, Oral) Active. Imodium A-D (2MG  Capsule, Oral) Active. Losartan Potassium (50MG  Tablet, Oral) Active. traMADol HCl (50MG  Tablet, Oral) Active. PreserVision AREDS (Oral) Active. Medications Reconciled  Social History Geni Bers [place], RMA; 06/15/2019 12:10 PM) Alcohol use Moderate alcohol use. Caffeine use Coffee, Tea. No drug use Tobacco use Former smoker.  Family History Geni Bers [place], RMA; 06/15/2019 12:10 PM) Alcohol Abuse Daughter, Son. Arthritis Daughter. Cancer Father. Depression  Daughter. Diabetes Mellitus Mother. Heart Disease Mother. Heart disease in male family member before age 20 Heart disease in male family member before age 7 Hypertension Daughter, Mother. Melanoma Daughter. Ovarian Cancer Daughter. Respiratory Condition Father.  Other Problems Geni Bers [place], RMA; 06/15/2019 12:10 PM) Anxiety Disorder Arthritis Back Pain Cerebrovascular Accident Chronic Renal Failure Syndrome Diabetes Mellitus Gastroesophageal Reflux Disease Hemorrhoids High blood pressure Myocardial infarction Thyroid Disease     Review of Systems Geni Bers Haggett RMA; 06/15/2019 12:10 PM) General Present- Appetite Loss, Fatigue and Weight Loss. Not Present- Chills, Fever, Night Sweats and Weight Gain. Skin Present- Dryness, New Lesions and Non-Healing Wounds. Not Present- Change in Wart/Mole, Hives, Jaundice, Rash and Ulcer. HEENT Present- Hearing Loss, Hoarseness, Visual Disturbances and Wears glasses/contact lenses. Not Present- Earache, Nose Bleed, Oral Ulcers, Ringing in the Ears, Seasonal Allergies, Sinus Pain, Sore Throat and Yellow Eyes. Respiratory Present- Snoring. Not Present- Bloody sputum, Chronic Cough, Difficulty Breathing and Wheezing. Breast Not Present- Breast Mass, Breast Pain, Nipple Discharge and Skin Changes. Cardiovascular Present- Swelling of Extremities. Not Present- Chest Pain, Difficulty Breathing Lying Down, Leg Cramps, Palpitations, Rapid Heart Rate and Shortness of Breath. Gastrointestinal Present- Change in Bowel Habits, Chronic diarrhea, Difficulty Swallowing, Hemorrhoids, Indigestion and Rectal Pain. Not Present- Abdominal Pain, Bloating, Bloody Stool, Constipation, Excessive gas, Gets full quickly at meals, Nausea and Vomiting. Male Genitourinary Not Present- Blood in Urine, Change in Urinary Stream, Frequency, Impotence, Nocturia, Painful Urination, Urgency and Urine Leakage. Musculoskeletal Present- Back Pain, Joint  Pain, Joint Stiffness, Muscle Weakness and Swelling of Extremities. Not Present- Muscle Pain. Neurological Present- Decreased Memory, Tremor, Trouble walking and Weakness. Not Present- Fainting, Headaches, Numbness, Seizures and Tingling. Psychiatric Present- Anxiety. Not Present- Bipolar, Change in Sleep Pattern, Depression, Fearful and Frequent crying. Endocrine Present- Cold Intolerance. Not Present- Excessive Hunger, Hair Changes, Heat Intolerance, Hot flashes and New Diabetes. Hematology Present- Easy Bruising. Not Present- Blood Thinners, Excessive bleeding, Gland problems, HIV and Persistent Infections.  Vitals [employer] Haggett RMA; 06/15/2019 11:22 AM) 06/15/2019 11:21 AM Weight: 174.8 lb Height: 66in Body Surface Area: 1.89 m Body Mass Index: 28.21 kg/m  Temp.: 97.3F(Temporal)  Pulse: 98 (Regular)  P.OX: 98% (Room air) BP: 110/68 (Sitting, Left Arm, Standard)        Physical Exam Leighton Ruff MD; 5/00/3704 11:26 AM)  General Mental Status-Alert. General Appearance-Cooperative.  Abdomen Palpation/Percussion Palpation and Percussion of the abdomen reveal - Soft and Non Tender.  Rectal Anorectal Exam External - Note: Area of fluctuance and right anterior perianal region consistent with his recurrent abscess. There is minimal tenderness to palpation. It is draining purulence.    Assessment & Plan Leighton Ruff MD; 8/88/9169 11:48 AM)  ANAL ABSCESS (K61.0) Impression: 86 year old male with recurrent perianal abscess. I have recommended anal exam under anesthesia in the operating room with possible incision and drainage. We may be able to do a seton placement, if a fistula is identified. We will plan on doing a rigid sigmoidoscopy at the same time to evaluate for any source of rectal bleeding.  Risks include bleeding, recurrence and complications of anesthesia.

## 2019-06-17 ENCOUNTER — Telehealth: Payer: Self-pay | Admitting: *Deleted

## 2019-06-17 NOTE — Telephone Encounter (Signed)
Appointment schedule for Monday 5th @ 2:15

## 2019-06-17 NOTE — Telephone Encounter (Signed)
   [facility] Pre-operative Risk Assessment    Request for surgical clearance:  1. What type of surgery is being performed? Incision & Drainage of perirectal abscess; possible Seton; Rigid Sigmoidscopy.   2. When is this surgery scheduled? TBD   3. What type of clearance is required (medical clearance vs. Pharmacy clearance to hold med vs. Both)? Medical  4. Are there any medications that need to be held prior to surgery and how long?   5. Practice name and name of physician performing surgery? [facility]   6. What is your office phone number [phone]     7.   What is your office fax number- ATTN: Mammie Lorenzo [phone]  8.   Anesthesia type (None, local, MAC, general) ? Modified   Barbaraann Barthel 06/17/2019, 12:03 PM  _________________________________________________________________   (provider comments below)

## 2019-06-17 NOTE — Telephone Encounter (Signed)
Primary Cardiologist:Troy C Hilty, MD  Chart reviewed as part of pre-operative protocol coverage. Because of Troy Hicks past medical history and time since last visit, he/she will require a follow-up visit in order to better assess preoperative cardiovascular risk. He has not been seen by cardiology since 04/10/2018.  Needs appointment   Pre-op covering staff: - Please schedule appointment and call patient to inform them. - Please contact requesting surgeon's office via preferred method (i.e, phone, fax) to inform them of need for appointment prior to surgery.  If applicable, this message will also be routed to pharmacy pool and/or primary cardiologist for input on holding anticoagulant/antiplatelet agent as requested below so that this information is available at time of patient's appointment.   Troy Sims, NP  06/17/2019, 1:46 PM

## 2019-06-20 NOTE — Progress Notes (Signed)
Cardiology Office Note   Date:  06/22/2019   ID:  Troy Hicks, DOB 11-27-1919, MRN [id number]  PCP:  Welford Roche, NP  Cardiologist:  Dr. Debara Pickett  CC: Pre-Operative Evaluation    History of Present Illness: Troy Hicks is a 86 y.o. male who presents for preoperative evaluation.  He is to undergo incision and drainage of perirectal abscess, possible Seton; rigid sigmoidoscopy.  He has not been seen in the office since 04/10/2018 and therefore required appointment today.  On last office visit the patient had recently moved to [place] from [place].  He has a past medical history significant for CAD status post CABG, ischemic cardiomyopathy with an EF of 35% to 40%, SVT, hypertension, chronic lower extremity edema with venous insufficiency, mitral and tricuspid valvular heart disease, DVT status post IVC filter, along with type 2 diabetes.   On last office visit on 06/30/2018, with Dr. Debara Pickett. Troy Hicks had complaints of acute dyspnea on exertion, his appetite had decreased and he was very depressed over the recent death of his wife.  He was seen by psychiatrist and placed on antidepressant medication prior to that office visit.  Echocardiogram was repeated with his history of ischemic cardiomyopathy along with labs BMET and BNP to assist with medical management.  The patient was in a skilled nurse facility and has a daughter who is very attentive.  Troy Hicks comes today doing okay.  He continues to be frail.  He does use a walker for ambulation and has fallen twice over the last year but did not have any significant injuries.  He has been diagnosed with pancreatic insufficiency and is being followed by [facility], and has also been diagnosed with macular degeneration.  He is currently living at [facility] skilled nursing facility.  He has had both of his Covid vaccinations.  He denies any cardiac symptoms however he is deconditioned and does have some dyspnea on exertion with  walking.  Past Medical History:  Diagnosis Date  . Chronic kidney disease (CKD), stage III (moderate)    Per [facility]   . Coronary artery disease    Per New Patient Packet,PSC   . Edema    Per New Patient Packet,[facility]   . Gait instability    Per New Patient Packet,PSC   . Gout    Per New Patient Packet,PSC   . Hypertension    Per New Patient Packet,[facility]   . Insulin dependent diabetes mellitus    Per New Patient Packet,PSC   . Osteoarthritis    Per New Patient Packet,PSC   . Syncope 2015   Per New Patient Packet,PSC   . TIA (transient ischemic attack) 2010   Per New Patient Packet,PSC     Past Surgical History:  Procedure Laterality Date  . CORONARY ARTERY BYPASS GRAFT  1990   Per New Patient Packet,PSC   . HEMORRHOID SURGERY     over 25 to 40 years ago  . IVC FILTER INSERTION     Per New Patient Packet,PSC   . PILONIDAL CYST EXCISION  1944   Per New Patient [place]   . SKIN SURGERY  06/2018   Spot removed from head  . TONSILLECTOMY     Per New Patient Packet,PSC (childhood)      Current Outpatient Medications  Medication Sig Dispense Refill  . acetaminophen (TYLENOL) 500 MG tablet Take 500 mg by mouth 2 (two) times daily.     Marland Kitchen allopurinol (ZYLOPRIM) 100 MG tablet Take 100 mg by  mouth daily.    . Alogliptin Benzoate 25 MG TABS Take 1 tablet by mouth daily.    Marland Kitchen aspirin EC 81 MG tablet Take 81 mg by mouth daily.    . Colchicine 0.6 MG CAPS Take 1 capsule by mouth as directed. Give one tablet by mouth as needed for gout take 1 tablet at first sign of gout then take 1 tablet 2 hours later. Only give every 14 days due to kidney functions    . colestipol (COLESTID) 1 g tablet Take 1 g by mouth daily.    Marland Kitchen Dextran 70-Hypromellose, PF, 0.1-0.3 % SOLN Apply 1 drop to eye every 4 (four) hours as needed (apply to both eyes).    . furosemide (LASIX) 80 MG tablet Take 80 mg by mouth daily.    . insulin glargine (LANTUS) 100 UNIT/ML injection Inject 15 Units into  the skin at bedtime.     Marland Kitchen levothyroxine (SYNTHROID, LEVOTHROID) 100 MCG tablet Take 100 mcg by mouth daily before breakfast.    . lipase/protease/amylase (CREON) 36000 UNITS CPEP capsule Take 2 capsules with each meal and 1 capsule with a snack daily 720 capsule 2  . loperamide (IMODIUM) 2 MG capsule Take 2 mg by mouth as needed for diarrhea or loose stools.    Marland Kitchen losartan (COZAAR) 50 MG tablet Take 50 mg by mouth daily.     . Multiple Vitamins-Minerals (PRESERVISION AREDS) CAPS Take 1 capsule by mouth 2 (two) times daily.    . Potassium Chloride Crys ER (KLOR-CON M20 PO) Take 1 tablet by mouth daily.    Marland Kitchen saccharomyces boulardii (FLORASTOR) 250 MG capsule Take 1 capsule (250 mg total) by mouth 2 (two) times daily. 60 capsule 0  . sertraline (ZOLOFT) 25 MG tablet Take 25 mg by mouth daily.    . traMADol (ULTRAM) 50 MG tablet Take 50 mg by mouth in the morning and at bedtime.     . traZODone (DESYREL) 50 MG tablet Take 50 mg by mouth at bedtime.     No current facility-administered medications for this visit.    Allergies:   Codeine    Social History:  The patient  reports that he quit smoking about 62 years ago. He has a 7.50 pack-year smoking history. He has never used smokeless tobacco. He reports current alcohol use. He reports previous drug use.   Family History:  The patient's family history includes Alcoholism in his daughter and son; Cirrhosis in his son; Depression in his daughter; Diabetes in his mother; Heart disease in his mother; Ovarian cancer in his daughter; Throat cancer in his father.    ROS: All other systems are reviewed and negative. Unless otherwise mentioned in H&P    PHYSICAL EXAM: VS:  BP 132/76   Pulse 82   Ht 5\' 6"  (1.676 m)   Wt 173 lb 8 oz (78.7 kg)   BMI 28.00 kg/m  , BMI Body mass index is 28 kg/m. GEN: Well nourished, well developed, in no acute distress HEENT: normal Neck: no JVD, carotid bruits, or masses Cardiac: IRRR; no murmurs, rubs, or  gallops, bilateral dependent edema  Respiratory:  Clear to auscultation bilaterally, normal work of breathing GI: soft, nontender, nondistended, + BS MS: no deformity or atrophy Skin: warm and dry, no rash Neuro:  Strength and sensation are intact, very hard of hearing Psych: euthymic mood, full affect   EKG: (Personally reviewed) sinus rhythm with PACs, left axis deviation.  Prior inferior anterior lateral infarct is noted.  Rate  of 82 bpm.  (Compared to prior EKG 1 year ago it is unchanged with exception of rate).  Recent Labs: 05/25/2019: ALT 8; BUN 37; Creatinine, Ser 1.89; Hemoglobin 12.3; Platelets 94.0; Potassium 5.0; Sodium 135    Lipid Panel No results found for: CHOL, TRIG, HDL, CHOLHDL, VLDL, LDLCALC, LDLDIRECT    Wt Readings from Last 3 Encounters:  06/22/19 173 lb 8 oz (78.7 kg)  05/14/19 180 lb 4 oz (81.8 kg)  05/22/18 181 lb (82.1 kg)      Other studies Reviewed:  Echocardiogram: 04/17/2018  1. The left ventricle has moderate-severely reduced systolic function of  96-43%. The cavity size is moderately increased. There is no left  ventricular wall thickness. Echo evidence of impaired relaxation diastolic  filling patterns.  2. Mildly dilated left atrial size.  3. Mildly dilated right atrial size.  4. The mitral valve is degenerative. There is mild thickening and mildly  calcified. Regurgitation is mild by color flow Doppler.  5. Normal tricuspid valve.  6. Tricuspid regurgitation is mild.  7. The aortic valve tricuspid. There is moderate thickening and sclerosis  without any evidence of stenosis of the aortic valve.  8. Pulmonic valve regurgitation is mild by color flow Doppler.  9. No atrial level shunt detected by color flow Doppler.    ASSESSMENT AND PLAN:  1. Pre-Operative Evaluation:  Chart reviewed as part of pre-operative protocol coverage. Given past medical history and time since last visit, based on ACC/AHA guidelines, Troy Hicks would  be at acceptable risk for the planned procedure without further cardiovascular testing. No medications need to be held for surgery.   2. ICM: Last echo was 03/2018.  I will repeat this in the next 2 months. Will not keep him from having his procedure.  Will help with medical management.   3. CAD hx of CABG: No cardiac symptoms. He will continue on medical management   4. Pancreatic insufficiency: Followed by GI.    Current medicines are reviewed at length with the patient today.  I have spent 25 minutes dedicated to the care of this patient on the date of this encounter to include pre-visit review of records, assessment, management and diagnostic testing,with shared decision making.  Labs/ tests ordered today include: None   Phill Myron. West Pugh, ANP, AACC   06/22/2019 3:00 PM    [facility] [address] Office [phone] Fax [phone]  Notice: This dictation was prepared with Dragon dictation along with smaller phrase technology. Any transcriptional errors that result from this process are unintentional and may not be corrected upon review.

## 2019-06-22 ENCOUNTER — Other Ambulatory Visit: Payer: Self-pay

## 2019-06-22 ENCOUNTER — Encounter: Payer: Self-pay | Admitting: Adult Health

## 2019-06-22 ENCOUNTER — Ambulatory Visit: Payer: Medicare Other | Admitting: Adult Health

## 2019-06-22 VITALS — BP 132/76 | HR 82 | Ht 66.0 in | Wt 173.5 lb

## 2019-06-22 DIAGNOSIS — I255 Ischemic cardiomyopathy: Secondary | ICD-10-CM | POA: Diagnosis not present

## 2019-06-22 DIAGNOSIS — Z01810 Encounter for preprocedural cardiovascular examination: Secondary | ICD-10-CM | POA: Diagnosis not present

## 2019-06-22 DIAGNOSIS — I2510 Atherosclerotic heart disease of native coronary artery without angina pectoris: Secondary | ICD-10-CM | POA: Diagnosis not present

## 2019-06-22 NOTE — Patient Instructions (Addendum)
Medication Instructions:  Continue current medications  *If you need a refill on your cardiac medications before your next appointment, please call your pharmacy*   Lab Work: None Ordered   Testing/Procedures: Your physician has requested that you have an echocardiogram. Echocardiography is a painless test that uses sound waves to create images of your heart. It provides your doctor with information about the size and shape of your heart and how well your heart's chambers and valves are working. This procedure takes approximately one hour. There are no restrictions for this procedure.   Follow-Up: At [facility], you and your health needs are our priority.  As part of our continuing mission to provide you with exceptional heart care, we have created designated Provider Care Teams.  These Care Teams include your primary Cardiologist (physician) and Advanced Practice Providers (APPs -  Physician Assistants and Nurse Practitioners) who all work together to provide you with the care you need, when you need it.  We recommend signing up for the patient portal called "MyChart".  Sign up information is provided on this After Visit Summary.  MyChart is used to connect with patients for Virtual Visits (Telemedicine).  Patients are able to view lab/test results, encounter notes, upcoming appointments, etc.  Non-urgent messages can be sent to your provider as well.   To learn more about what you can do with MyChart, go to [email or web address].    Your next appointment:   6 month(s)  The format for your next appointment:   In Person  Provider:   You may see Pixie Casino, MD or one of the following Advanced Practice Providers on your designated Care Team:    Almyra Deforest, PA-C  Fabian Sharp, PA-C or   Roby Lofts, [place]   Other Instructions You are cleared for your procedure

## 2019-06-25 ENCOUNTER — Telehealth: Payer: Self-pay | Admitting: Nurse Practitioner

## 2019-06-25 NOTE — Telephone Encounter (Signed)
The pt daughter is calling because the pt has BRBPR occasionally.  Saw Dr Marcello Moores and was told the bleeding is not from rectal abscess.  He is waiting for surgical appt.  Has pancreatic insufficieny with diarrhea and was started on colestipol.   The colestipol has helped but now he is having constipation.  The pt daughter is not sure how often the pt is having a BM or if the stools are hard or just less often.  She would like to know what they need to do.  She does not want to have to bring the pt into the office.

## 2019-06-25 NOTE — Telephone Encounter (Signed)
Troy Hicks, Dr. Bryan Lemma previously ordered Creon, refer to lab notes to pancreatic elastase report. If he is taking Creon and getting constipated then reduce Creon dose in half. If he is taking Colestipol and getting constipated then he should reduce Colestipol to one tab every other day. Desitin for hemorrhoidal irritation/bleeding as previously directed 2 to 3 times daily as needed. Thank you. [employer]

## 2019-06-30 ENCOUNTER — Telehealth: Payer: Self-pay | Admitting: Nurse Practitioner

## 2019-06-30 NOTE — Telephone Encounter (Signed)
Please see additional charting for more information for this patient-please advise

## 2019-06-30 NOTE — Telephone Encounter (Signed)
Pls inform daughter, father can stop probiotic. Reduce Creon to 1 tab with breakfast and dinner. If he has recurrence of loose stools then he should take the Creon 1 tab with each meal.

## 2019-06-30 NOTE — Telephone Encounter (Signed)
Patients daughter calling- she states that she is following up that she was waiting to hear back from [place]- but states that hes general physican looked over lab results and saw that hes platelets were low and wanted to make sure that was not a concern for surgery.

## 2019-06-30 NOTE — Telephone Encounter (Signed)
Called and spoke with patient's daughter-Beth-verified DPR- Beth reports the patient is "going daily but he has to work at it"; diarrhea/watery stool is "gone"; oozing from rectal abscess has stopped;   [name] is requesting to know which medications can be reduced or stopped and not affect the constipation/diarrhea?  Can patient stop the probiotic (taking too many pills per day-needing to reduce the number of pills if possible)?  Per [name], please be specific on instructions for Creon---  Patient's lab work was resulted -low platelets-does this issue need to be addressed prior to surgery?   Please advise

## 2019-07-01 NOTE — Telephone Encounter (Signed)
Called and spoke with patient's daughter-Troy Hicks-verified DPR- Troy Hicks given provider's recommendations; Troy Hicks is agreeable to plan of care and requested these instructions be faxed to the [place] (sent) and to [facility](sent); Troy Hicks advised to call back to the office at [phone] should questions/concerns arise;  Troy Hicks verbalized understanding of information/instructions;

## 2019-07-01 NOTE — Telephone Encounter (Signed)
Bre, I would defer that specific question to Dr. Marcello Moores the rectal surgeon. thx

## 2019-07-02 NOTE — Telephone Encounter (Signed)
When RN spoke with patient's daughter on 07/01/2019 -RN advised Beth to contact sx for this information as it was more related to the upcoming procedure-she reports she will call that office and then call back to the GI office if further questions or concerns arise;

## 2019-07-08 ENCOUNTER — Ambulatory Visit: Payer: Medicare Other | Admitting: Podiatry

## 2019-07-08 ENCOUNTER — Other Ambulatory Visit (HOSPITAL_COMMUNITY): Payer: Medicare Other

## 2019-07-08 ENCOUNTER — Telehealth: Payer: Self-pay | Admitting: Hematology and Oncology

## 2019-07-08 ENCOUNTER — Encounter: Payer: Self-pay | Admitting: *Deleted

## 2019-07-08 NOTE — Telephone Encounter (Signed)
Received a new hem referral from [employer] for thrombocytopenia. I cld and spoke to the pt's daughter to offer an appt on 4/23 but she declined. She has requested an appt in the beginning of May. Mr. Bertoli has been scheduled to see Dr. Lindi Adie on 5/4 at 230pm. Aware to arrive 15 minutes early.

## 2019-07-21 ENCOUNTER — Inpatient Hospital Stay (HOSPITAL_BASED_OUTPATIENT_CLINIC_OR_DEPARTMENT_OTHER): Payer: Medicare Other | Admitting: Hematology and Oncology

## 2019-07-21 ENCOUNTER — Other Ambulatory Visit: Payer: Self-pay

## 2019-07-21 ENCOUNTER — Inpatient Hospital Stay: Payer: Medicare Other | Attending: Hematology and Oncology

## 2019-07-21 DIAGNOSIS — R54 Age-related physical debility: Secondary | ICD-10-CM | POA: Diagnosis not present

## 2019-07-21 DIAGNOSIS — Z8041 Family history of malignant neoplasm of ovary: Secondary | ICD-10-CM

## 2019-07-21 DIAGNOSIS — Z833 Family history of diabetes mellitus: Secondary | ICD-10-CM | POA: Insufficient documentation

## 2019-07-21 DIAGNOSIS — Z808 Family history of malignant neoplasm of other organs or systems: Secondary | ICD-10-CM | POA: Diagnosis not present

## 2019-07-21 DIAGNOSIS — M1990 Unspecified osteoarthritis, unspecified site: Secondary | ICD-10-CM

## 2019-07-21 DIAGNOSIS — E1122 Type 2 diabetes mellitus with diabetic chronic kidney disease: Secondary | ICD-10-CM | POA: Diagnosis not present

## 2019-07-21 DIAGNOSIS — Z8249 Family history of ischemic heart disease and other diseases of the circulatory system: Secondary | ICD-10-CM | POA: Diagnosis not present

## 2019-07-21 DIAGNOSIS — I2510 Atherosclerotic heart disease of native coronary artery without angina pectoris: Secondary | ICD-10-CM

## 2019-07-21 DIAGNOSIS — Z8719 Personal history of other diseases of the digestive system: Secondary | ICD-10-CM | POA: Diagnosis not present

## 2019-07-21 DIAGNOSIS — Z885 Allergy status to narcotic agent status: Secondary | ICD-10-CM | POA: Insufficient documentation

## 2019-07-21 DIAGNOSIS — D696 Thrombocytopenia, unspecified: Secondary | ICD-10-CM

## 2019-07-21 DIAGNOSIS — Z8673 Personal history of transient ischemic attack (TIA), and cerebral infarction without residual deficits: Secondary | ICD-10-CM | POA: Diagnosis not present

## 2019-07-21 DIAGNOSIS — Z811 Family history of alcohol abuse and dependence: Secondary | ICD-10-CM | POA: Diagnosis not present

## 2019-07-21 DIAGNOSIS — Z87891 Personal history of nicotine dependence: Secondary | ICD-10-CM | POA: Diagnosis not present

## 2019-07-21 DIAGNOSIS — Z6372 Alcoholism and drug addiction in family: Secondary | ICD-10-CM

## 2019-07-21 DIAGNOSIS — Z79899 Other long term (current) drug therapy: Secondary | ICD-10-CM | POA: Insufficient documentation

## 2019-07-21 DIAGNOSIS — N183 Chronic kidney disease, stage 3 unspecified: Secondary | ICD-10-CM | POA: Insufficient documentation

## 2019-07-21 DIAGNOSIS — K8689 Other specified diseases of pancreas: Secondary | ICD-10-CM | POA: Diagnosis not present

## 2019-07-21 DIAGNOSIS — Z818 Family history of other mental and behavioral disorders: Secondary | ICD-10-CM

## 2019-07-21 LAB — CBC WITH DIFFERENTIAL (CANCER CENTER ONLY)
Abs Immature Granulocytes: 0.03 10*3/uL (ref 0.00–0.07)
Basophils Absolute: 0.1 10*3/uL (ref 0.0–0.1)
Basophils Relative: 1 %
Eosinophils Absolute: 0.4 10*3/uL (ref 0.0–0.5)
Eosinophils Relative: 5 %
HCT: 37.6 % — ABNORMAL LOW (ref 39.0–52.0)
Hemoglobin: 12.3 g/dL — ABNORMAL LOW (ref 13.0–17.0)
Immature Granulocytes: 0 %
Lymphocytes Relative: 16 %
Lymphs Abs: 1.2 10*3/uL (ref 0.7–4.0)
MCH: 32.9 pg (ref 26.0–34.0)
MCHC: 32.7 g/dL (ref 30.0–36.0)
MCV: 100.5 fL — ABNORMAL HIGH (ref 80.0–100.0)
Monocytes Absolute: 0.9 10*3/uL (ref 0.1–1.0)
Monocytes Relative: 11 %
Neutro Abs: 5.1 10*3/uL (ref 1.7–7.7)
Neutrophils Relative %: 67 %
Platelet Count: 91 10*3/uL — ABNORMAL LOW (ref 150–400)
RBC: 3.74 MIL/uL — ABNORMAL LOW (ref 4.22–5.81)
RDW: 14.6 % (ref 11.5–15.5)
WBC Count: 7.7 10*3/uL (ref 4.0–10.5)
nRBC: 0 % (ref 0.0–0.2)

## 2019-07-21 LAB — PLATELET BY CITRATE

## 2019-07-21 NOTE — Progress Notes (Signed)
Labs successfully faxed to Welford Roche, DNP per daughter request.   Fax [phone]

## 2019-07-21 NOTE — Progress Notes (Signed)
[facility] CONSULT NOTE  Patient Care Team: Welford Roche, NP as PCP - General (Gerontology) Debara Pickett Nadean Corwin, MD as PCP - Cardiology (Cardiology)  CHIEF COMPLAINTS/PURPOSE OF CONSULTATION:  Newly diagnosed thrombocytopenia  HISTORY OF PRESENTING ILLNESS:  Troy Hicks 86 y.o. male is here because of recent diagnosis of thrombocytopenia. He is referred by [employer]. Labs on 05/25/19 showed Hg 12.3, HCT 36.5, platelets 94, iron saturation 40%, ferritin 276, folate 17.7, B-12 484.  Repeat platelet count done in April 2021 showed a platelet count of 65 and therefore he was referred to [place].  He has not noticed any excessive bleeding symptoms.  He does bruise easily because of frail skin.  He is accompanied today by his daughter.  He presents to the clinic today for initial evaluation.   I reviewed his records extensively and collaborated the history with the patient.  MEDICAL HISTORY:  Past Medical History:  Diagnosis Date  . Chronic kidney disease (CKD), stage III (moderate)    Per New Patient Packet,PSC   . Coronary artery disease    Per New Patient Packet,PSC   . Edema    Per New Patient Packet,PSC   . Gait instability    Per New Patient Packet,[facility]   . Gout    Per New Patient Packet,[facility]   . Hypertension    Per New Patient Packet,PSC   . Insulin dependent diabetes mellitus    Per New Patient Packet,PSC   . Osteoarthritis    Per New Patient Packet,PSC   . Syncope 2015   Per New Patient Packet,PSC   . TIA (transient ischemic attack) 2010   Per New Patient Packet,PSC     SURGICAL HISTORY: Past Surgical History:  Procedure Laterality Date  . CORONARY ARTERY BYPASS GRAFT  1990   Per New Patient Packet,PSC   . HEMORRHOID SURGERY     over 25 to 40 years ago  . IVC FILTER INSERTION     Per New Patient Packet,PSC   . PILONIDAL CYST EXCISION  1944   Per New Patient [place]   . SKIN SURGERY  06/2018   Spot removed from head  . TONSILLECTOMY      Per New Patient Packet,PSC (childhood)     SOCIAL HISTORY: Social History   Socioeconomic History  . Marital status: Married    Spouse name: Not on file  . Number of children: Not on file  . Years of education: Not on file  . Highest education level: Not on file  Occupational History  . Not on file  Tobacco Use  . Smoking status: Former Smoker    Packs/day: 0.50    Years: 15.00    Pack years: 7.50    Quit date: 03/19/1957    Years since quitting: 62.3  . Smokeless tobacco: Never Used  . Tobacco comment: 60 years ago as of 2019   Substance and Sexual Activity  . Alcohol use: Yes    Comment: 1 drink per day  . Drug use: Not Currently  . Sexual activity: Not on file  Other Topics Concern  . Not on file  Social History Narrative   Diet: Diabetic      Caffeine: Morning Coffee       Married, if yes what year: Yes, 1952      Do you live in a house, apartment, assisted living, condo, trailer, ect: Assisted Living, 1 stories, 2 person       Pets: No  Current/Past profession: Photographer, Glass blower/designer       Exercise: Some, walking       Living Will: Yes   DNR: Yes   POA/HPOA: Yes      Functional Status:   Do you have difficulty bathing or dressing yourself?  No   Do you have difficulty preparing food or eating? No   Do you have difficulty managing your medications? No, sometimes forgets insulin injection   Do you have difficulty managing your finances? Yes   Do you have difficulty affording your medications? Yes   Social Determinants of Health   Financial Resource Strain:   . Difficulty of Paying Living Expenses:   Food Insecurity:   . Worried About Charity fundraiser in the Last Year:   . Arboriculturist in the Last Year:   Transportation Needs:   . Film/video editor (Medical):   Marland Kitchen Lack of Transportation (Non-Medical):   Physical Activity:   . Days of Exercise per Week:   . Minutes of Exercise per Session:   Stress:   . Feeling of  Stress :   Social Connections:   . Frequency of Communication with Friends and Family:   . Frequency of Social Gatherings with Friends and Family:   . Attends Religious Services:   . Active Member of Clubs or Organizations:   . Attends Archivist Meetings:   Marland Kitchen Marital Status:   Intimate Partner Violence:   . Fear of Current or Ex-Partner:   . Emotionally Abused:   Marland Kitchen Physically Abused:   . Sexually Abused:     FAMILY HISTORY: Family History  Problem Relation Age of Onset  . Diabetes Mother   . Heart disease Mother   . Throat cancer Father   . Cirrhosis Son   . Alcoholism Son   . Ovarian cancer Daughter   . Depression Daughter   . Alcoholism Daughter   . Colon cancer Neg Hx     ALLERGIES:  is allergic to codeine.  MEDICATIONS:  Current Outpatient Medications  Medication Sig Dispense Refill  . acetaminophen (TYLENOL) 500 MG tablet Take 500 mg by mouth 2 (two) times daily.     Marland Kitchen allopurinol (ZYLOPRIM) 100 MG tablet Take 100 mg by mouth daily.    . Alogliptin Benzoate 25 MG TABS Take 1 tablet by mouth daily.    Marland Kitchen aspirin EC 81 MG tablet Take 81 mg by mouth daily.    . Colchicine 0.6 MG CAPS Take 1 capsule by mouth as directed. Give one tablet by mouth as needed for gout take 1 tablet at first sign of gout then take 1 tablet 2 hours later. Only give every 14 days due to kidney functions    . colestipol (COLESTID) 1 g tablet Take 1 g by mouth daily.    Marland Kitchen Dextran 70-Hypromellose, PF, 0.1-0.3 % SOLN Apply 1 drop to eye every 4 (four) hours as needed (apply to both eyes).    . furosemide (LASIX) 80 MG tablet Take 80 mg by mouth daily.    . insulin glargine (LANTUS) 100 UNIT/ML injection Inject 15 Units into the skin at bedtime.     Marland Kitchen levothyroxine (SYNTHROID, LEVOTHROID) 100 MCG tablet Take 100 mcg by mouth daily before breakfast.    . lipase/protease/amylase (CREON) 36000 UNITS CPEP capsule Take 2 capsules with each meal and 1 capsule with a snack daily 720 capsule 2  .  loperamide (IMODIUM) 2 MG capsule Take 2 mg by mouth as needed  for diarrhea or loose stools.    Marland Kitchen losartan (COZAAR) 50 MG tablet Take 50 mg by mouth daily.     . Multiple Vitamins-Minerals (PRESERVISION AREDS) CAPS Take 1 capsule by mouth 2 (two) times daily.    . Potassium Chloride Crys ER (KLOR-CON M20 PO) Take 1 tablet by mouth daily.    Marland Kitchen saccharomyces boulardii (FLORASTOR) 250 MG capsule Take 1 capsule (250 mg total) by mouth 2 (two) times daily. 60 capsule 0  . sertraline (ZOLOFT) 25 MG tablet Take 25 mg by mouth daily.    . traMADol (ULTRAM) 50 MG tablet Take 50 mg by mouth in the morning and at bedtime.     . traZODone (DESYREL) 50 MG tablet Take 50 mg by mouth at bedtime.     No current facility-administered medications for this visit.    REVIEW OF SYSTEMS:   Constitutional: Denies fevers, chills or abnormal night sweats Eyes: Denies blurriness of vision, double vision or watery eyes Ears, nose, mouth, throat, and face: Denies mucositis or sore throat Respiratory: Denies cough, dyspnea or wheezes Cardiovascular: Denies palpitation, chest discomfort or lower extremity swelling Gastrointestinal:  Denies nausea, heartburn or change in bowel habits Skin: Denies abnormal skin rashes Lymphatics: Denies new lymphadenopathy or easy bruising Neurological:Denies numbness, tingling or new weaknesses Behavioral/Psych: Mood is stable, no new changes  All other systems were reviewed with the patient and are negative.  PHYSICAL EXAMINATION: ECOG PERFORMANCE STATUS: 1 - Symptomatic but completely ambulatory  Vitals:   07/21/19 1451  BP: (!) 124/59  Pulse: 84  Resp: 18  Temp: 98.3 F (36.8 C)  SpO2: 98%   Filed Weights   07/21/19 1451  Weight: 169 lb 12.8 oz (77 kg)    GENERAL:alert, no distress and comfortable SKIN: skin color, texture, turgor are normal, no rashes or significant lesions EYES: normal, conjunctiva are pink and non-injected, sclera clear OROPHARYNX:no exudate, no  erythema and lips, buccal mucosa, and tongue normal  NECK: supple, thyroid normal size, non-tender, without nodularity LYMPH:  no palpable lymphadenopathy in the cervical, axillary or inguinal LUNGS: clear to auscultation and percussion with normal breathing effort HEART: regular rate & rhythm and no murmurs and no lower extremity edema ABDOMEN:abdomen soft, non-tender and normal bowel sounds Musculoskeletal:no cyanosis of digits and no clubbing  PSYCH: alert & oriented x 3 with fluent speech NEURO: no focal motor/sensory deficits  LABORATORY DATA:  I have reviewed the data as listed Lab Results  Component Value Date   WBC 7.7 07/21/2019   HGB 12.3 (L) 07/21/2019   HCT 37.6 (L) 07/21/2019   MCV 100.5 (H) 07/21/2019   PLT 91 (L) 07/21/2019   Lab Results  Component Value Date   NA 135 05/25/2019   K 5.0 05/25/2019   CL 103 05/25/2019   CO2 23 05/25/2019    RADIOGRAPHIC STUDIES: I have personally reviewed the radiological reports and agreed with the findings in the report.  ASSESSMENT AND PLAN:  Thrombocytopenia ([place]) Lab review April 2021: Platelets 65 05/25/2019: Platelets 94 05/15/2018: Platelet count 100 01/09/2017: Platelets 90 Multiple comorbidities including diabetes, pancreatic insufficiency, chronic kidney disease, coronary artery disease  Mild thrombocytopenia:  Today's platelet count: 91  Differential diagnosis: 1. Low-grade ITP 2. medication induced: On further review patient is not taking any medications that are associated with thrombocytopenia 3. Bone marrow factors  I discussed with the patient that the level of thrombocytopenia is very mild and that these levels, there are usually no adverse effects. There is usually no risk  of bleeding and hence it can be observed without making any changes to patient's medications or requiring any further investigations like bone marrow biopsies.  I recommended watchful monitoring. Based on stable platelet count, I do  not recommend any further follow-ups with [place]. Return to clinic on an as-needed basis.   All questions were answered. The patient knows to call the clinic with any problems, questions or concerns.   Rulon Eisenmenger, MD, MPH 07/22/2019     I, Molly Dorshimer, am acting as scribe for Nicholas Lose, MD.  I have reviewed the above documentation for accuracy and completeness, and I agree with the above.

## 2019-07-21 NOTE — Assessment & Plan Note (Addendum)
Lab review 05/25/2019: Platelets 94 05/15/2018: Platelet count 100 01/09/2017: Platelets 90 Multiple comorbidities including diabetes, pancreatic insufficiency, chronic kidney disease, coronary artery disease  Mild thrombocytopenia:   Differential diagnosis: 1. Low-grade ITP 2. medication induced: On further review patient is not taking any medications that are associated with thrombocytopenia 3. Bone marrow factors 4. Hepatitis B/C 5. Splenomegaly: No enlarged spleen was palpable to physical exam.  I discussed with the patient that the level of thrombocytopenia is very mild and that these levels, there are usually no adverse effects. There is usually no risk of bleeding and hence it can be observed without making any changes to patient's medications or requiring any further investigations like bone marrow biopsies.  I recommended watchful monitoring. We can see the patient back in one year with labs and follow-up.

## 2019-08-04 ENCOUNTER — Ambulatory Visit (HOSPITAL_COMMUNITY): Payer: Medicare Other

## 2019-08-04 ENCOUNTER — Other Ambulatory Visit: Payer: Self-pay

## 2019-08-04 ENCOUNTER — Encounter: Payer: Self-pay | Admitting: Podiatry

## 2019-08-04 ENCOUNTER — Ambulatory Visit (INDEPENDENT_AMBULATORY_CARE_PROVIDER_SITE_OTHER): Payer: Medicare Other | Admitting: Podiatry

## 2019-08-04 DIAGNOSIS — M79674 Pain in right toe(s): Secondary | ICD-10-CM | POA: Diagnosis not present

## 2019-08-04 DIAGNOSIS — E1122 Type 2 diabetes mellitus with diabetic chronic kidney disease: Secondary | ICD-10-CM

## 2019-08-04 DIAGNOSIS — B351 Tinea unguium: Secondary | ICD-10-CM | POA: Diagnosis not present

## 2019-08-04 DIAGNOSIS — N183 Chronic kidney disease, stage 3 unspecified: Secondary | ICD-10-CM

## 2019-08-04 DIAGNOSIS — M79675 Pain in left toe(s): Secondary | ICD-10-CM

## 2019-08-04 DIAGNOSIS — Z794 Long term (current) use of insulin: Secondary | ICD-10-CM

## 2019-08-04 NOTE — Patient Instructions (Signed)
Diabetes Mellitus and Foot Care Foot care is an important part of your health, especially when you have diabetes. Diabetes may cause you to have problems because of poor blood flow (circulation) to your feet and legs, which can cause your skin to:  Become thinner and drier.  Break more easily.  Heal more slowly.  Peel and crack. You may also have nerve damage (neuropathy) in your legs and feet, causing decreased feeling in them. This means that you may not notice minor injuries to your feet that could lead to more serious problems. Noticing and addressing any potential problems early is the best way to prevent future foot problems. How to care for your feet Foot hygiene  Wash your feet daily with warm water and mild soap. Do not use hot water. Then, pat your feet and the areas between your toes until they are completely dry. Do not soak your feet as this can dry your skin.  Trim your toenails straight across. Do not dig under them or around the cuticle. File the edges of your nails with an emery board or nail file.  Apply a moisturizing lotion or petroleum jelly to the skin on your feet and to dry, brittle toenails. Use lotion that does not contain alcohol and is unscented. Do not apply lotion between your toes. Shoes and socks  Wear clean socks or stockings every day. Make sure they are not too tight. Do not wear knee-high stockings since they may decrease blood flow to your legs.  Wear shoes that fit properly and have enough cushioning. Always look in your shoes before you put them on to be sure there are no objects inside.  To break in new shoes, wear them for just a few hours a day. This prevents injuries on your feet. Wounds, scrapes, corns, and calluses  Check your feet daily for blisters, cuts, bruises, sores, and redness. If you cannot see the bottom of your feet, use a mirror or ask someone for help.  Do not cut corns or calluses or try to remove them with medicine.  If you  find a minor scrape, cut, or break in the skin on your feet, keep it and the skin around it clean and dry. You may clean these areas with mild soap and water. Do not clean the area with peroxide, alcohol, or iodine.  If you have a wound, scrape, corn, or callus on your foot, look at it several times a day to make sure it is healing and not infected. Check for: ? Redness, swelling, or pain. ? Fluid or blood. ? Warmth. ? Pus or a bad smell. General instructions  Do not cross your legs. This may decrease blood flow to your feet.  Do not use heating pads or hot water bottles on your feet. They may burn your skin. If you have lost feeling in your feet or legs, you may not know this is happening until it is too late.  Protect your feet from hot and cold by wearing shoes, such as at the beach or on hot pavement.  Schedule a complete foot exam at least once a year (annually) or more often if you have foot problems. If you have foot problems, report any cuts, sores, or bruises to your health care provider immediately. Contact a health care provider if:  You have a medical condition that increases your risk of infection and you have any cuts, sores, or bruises on your feet.  You have an injury that is not   healing.  You have redness on your legs or feet.  You feel burning or tingling in your legs or feet.  You have pain or cramps in your legs and feet.  Your legs or feet are numb.  Your feet always feel cold.  You have pain around a toenail. Get help right away if:  You have a wound, scrape, corn, or callus on your foot and: ? You have pain, swelling, or redness that gets worse. ? You have fluid or blood coming from the wound, scrape, corn, or callus. ? Your wound, scrape, corn, or callus feels warm to the touch. ? You have pus or a bad smell coming from the wound, scrape, corn, or callus. ? You have a fever. ? You have a red line going up your leg. Summary  Check your feet every day  for cuts, sores, red spots, swelling, and blisters.  Moisturize feet and legs daily.  Wear shoes that fit properly and have enough cushioning.  If you have foot problems, report any cuts, sores, or bruises to your health care provider immediately.  Schedule a complete foot exam at least once a year (annually) or more often if you have foot problems. This information is not intended to replace advice given to you by your health care provider. Make sure you discuss any questions you have with your health care provider. Document Revised: 11/26/2018 Document Reviewed: 04/06/2016 Elsevier Patient Education  2020 Elsevier Inc.  

## 2019-08-05 ENCOUNTER — Other Ambulatory Visit (HOSPITAL_COMMUNITY): Payer: Medicare Other

## 2019-08-10 NOTE — Progress Notes (Signed)
Subjective: Troy Hicks presents today at risk foot care. Pt has h/o NIDDM with chronic kidney disease and painful mycotic nails b/l that are difficult to trim. Pain interferes with ambulation. Aggravating factors include wearing enclosed shoe gear. Pain is relieved with periodic professional debridement.  He arrives from [facility] located at [facility] in [place].  Welford Roche, NP is patient's PCP.   Past Medical History:  Diagnosis Date  . Chronic kidney disease (CKD), stage III (moderate)    Per New Patient Packet,PSC   . Coronary artery disease    Per New Patient Packet,PSC   . Edema    Per New Patient Packet,PSC   . Gait instability    Per New Patient Packet,PSC   . Gout    Per New Patient Packet,PSC   . Hypertension    Per New Patient Packet,PSC   . Insulin dependent diabetes mellitus    Per New Patient Packet,PSC   . Osteoarthritis    Per New Patient Packet,PSC   . Syncope 2015   Per New Patient Packet,PSC   . TIA (transient ischemic attack) 2010   Per New Patient Packet,PSC      Current Outpatient Medications on File Prior to Visit  Medication Sig Dispense Refill  . acetaminophen (TYLENOL) 500 MG tablet Take 500 mg by mouth 2 (two) times daily.     Marland Kitchen allopurinol (ZYLOPRIM) 100 MG tablet Take 100 mg by mouth daily.    . Alogliptin Benzoate 25 MG TABS Take 1 tablet by mouth daily.    Marland Kitchen aspirin EC 81 MG tablet Take 81 mg by mouth daily.    . Colchicine 0.6 MG CAPS Take 1 capsule by mouth as directed. Give one tablet by mouth as needed for gout take 1 tablet at first sign of gout then take 1 tablet 2 hours later. Only give every 14 days due to kidney functions    . colestipol (COLESTID) 1 g tablet Take 1 g by mouth daily.    Marland Kitchen Dextran 70-Hypromellose, PF, 0.1-0.3 % SOLN Apply 1 drop to eye every 4 (four) hours as needed (apply to both eyes).    . furosemide (LASIX) 80 MG tablet Take 80 mg by mouth daily.    . insulin glargine (LANTUS) 100  UNIT/ML injection Inject 15 Units into the skin at bedtime.     Marland Kitchen levothyroxine (SYNTHROID, LEVOTHROID) 100 MCG tablet Take 100 mcg by mouth daily before breakfast.    . lipase/protease/amylase (CREON) 36000 UNITS CPEP capsule Take 2 capsules with each meal and 1 capsule with a snack daily 720 capsule 2  . loperamide (IMODIUM) 2 MG capsule Take 2 mg by mouth as needed for diarrhea or loose stools.    Marland Kitchen losartan (COZAAR) 50 MG tablet Take 50 mg by mouth daily.     . Multiple Vitamins-Minerals (PRESERVISION AREDS) CAPS Take 1 capsule by mouth 2 (two) times daily.    . mupirocin ointment (BACTROBAN) 2 % SMARTSIG:1 Application Topical 2-3 Times Daily    . Potassium Chloride Crys ER (KLOR-CON M20 PO) Take 1 tablet by mouth daily.    Marland Kitchen saccharomyces boulardii (FLORASTOR) 250 MG capsule Take 1 capsule (250 mg total) by mouth 2 (two) times daily. 60 capsule 0  . sertraline (ZOLOFT) 25 MG tablet Take 25 mg by mouth daily.    . traMADol (ULTRAM) 50 MG tablet Take 50 mg by mouth in the morning and at bedtime.     . traZODone (DESYREL) 50 MG tablet Take  50 mg by mouth at bedtime.     No current facility-administered medications on file prior to visit.     Allergies  Allergen Reactions  . Codeine     Objective: Troy Hicks is a pleasant 86 y.o. y.o. Patient Race: White or Caucasian [1]  male in NAD. AAO x 3.  There were no vitals filed for this visit.  Vascular Examination: Neurovascular status unchanged b/l. Capillary refill time to digits immediate b/l. Faintly palpable pedal pulses b/l. Pedal hair absent b/l Skin temperature gradient within normal limits b/l. +1 pitting edema b/l LE.  Dermatological Examination: Pedal skin is thin shiny, atrophic bilaterally. No open wounds bilaterally. No interdigital macerations bilaterally. Toenails 1-5 b/l elongated, dystrophic, thickened, crumbly with subungual debris and tenderness to dorsal palpation. No evidence of decubitus ulcerations of  feet/ankles.  Musculoskeletal: Normal muscle strength 5/5 to all lower extremity muscle groups bilaterally. No pain crepitus or joint limitation noted with ROM b/l. Hallux valgus with bunion deformity noted b/l.  Neurological Examination: Protective sensation intact 5/5 intact bilaterally with 10g monofilament b/l. Vibratory sensation intact b/l. Clonus negative b/l.  Assessment: 1. Pain due to onychomycosis of toenails of both feet   2. Type 2 diabetes mellitus with stage 3 chronic kidney disease, with long-term current use of insulin, unspecified whether stage 3a or 3b CKD ([place])    Plan: -Examined patient. -No new findings. No new orders. -Continue diabetic foot care principles. Literature dispensed on today.  -Toenails 1-5 b/l were debrided in length and girth with sterile nail nippers and dremel without iatrogenic bleeding.  -Patient to continue soft, supportive shoe gear daily. -Patient to report any pedal injuries to medical professional immediately. -Patient/POA to call should there be question/concern in the interim.  Return in about 3 months (around 11/04/2019) for diabetic nail trim.  Marzetta Board, DPM

## 2019-08-14 ENCOUNTER — Other Ambulatory Visit (HOSPITAL_COMMUNITY): Payer: Medicare Other

## 2019-08-25 ENCOUNTER — Telehealth: Payer: Self-pay | Admitting: Nurse Practitioner

## 2019-08-25 ENCOUNTER — Other Ambulatory Visit: Payer: Self-pay

## 2019-08-25 ENCOUNTER — Ambulatory Visit (HOSPITAL_COMMUNITY): Payer: Medicare Other | Attending: Cardiovascular Disease

## 2019-08-25 DIAGNOSIS — I255 Ischemic cardiomyopathy: Secondary | ICD-10-CM | POA: Diagnosis not present

## 2019-08-25 NOTE — Telephone Encounter (Signed)
As below, patient is here for an echocardiogram for preoperative assessment.  During study, daughter who is at bedside reported progressive shortness of breath and requested assessment per provider.  On lung exam, mild crackles in left lower lobe otherwise patient was stable.  I suggested possible further work-up with CXR per PCP versus cardiology office given history of systolic HF.  He is currently on Lasix 80 mg p.o. daily.  There were no slots available to work him into schedule for acute visit.  No adjustments were made.  Patient and family understand and agree.

## 2019-08-25 NOTE — Telephone Encounter (Signed)
I was approached by Tammie, echo sonographer regarding this patient who was in [facility] office today for echo.  His daughter requested that patient had worsening SOB presently which has been occurring recently in the afternoons. She requested that a provider in the office listen to the patient's lung sounds before they left. She declined an evaluation by triage nurse. Dr. Acie Fredrickson, DOD, was unable to go into the echo room at the time so Kathyrn Drown, NP was asked to go in and evaluate patient.

## 2019-08-26 ENCOUNTER — Encounter: Payer: Self-pay | Admitting: Internal Medicine

## 2019-08-26 ENCOUNTER — Telehealth: Payer: Self-pay | Admitting: *Deleted

## 2019-08-26 ENCOUNTER — Ambulatory Visit: Payer: Medicare Other | Admitting: Internal Medicine

## 2019-08-26 VITALS — BP 154/78 | HR 81 | Ht 66.0 in | Wt 170.2 lb

## 2019-08-26 DIAGNOSIS — I5084 End stage heart failure: Secondary | ICD-10-CM

## 2019-08-26 DIAGNOSIS — I255 Ischemic cardiomyopathy: Secondary | ICD-10-CM | POA: Diagnosis not present

## 2019-08-26 DIAGNOSIS — Z79899 Other long term (current) drug therapy: Secondary | ICD-10-CM | POA: Diagnosis not present

## 2019-08-26 DIAGNOSIS — Z01812 Encounter for preprocedural laboratory examination: Secondary | ICD-10-CM

## 2019-08-26 DIAGNOSIS — Z951 Presence of aortocoronary bypass graft: Secondary | ICD-10-CM

## 2019-08-26 DIAGNOSIS — N1832 Chronic kidney disease, stage 3b: Secondary | ICD-10-CM

## 2019-08-26 MED ORDER — FUROSEMIDE 80 MG PO TABS: 80.0000 mg | ORAL_TABLET | Freq: Two times a day (BID) | ORAL | 3 refills | Status: DC

## 2019-08-26 NOTE — Progress Notes (Signed)
OFFICE CONSULT NOTE  Chief Complaint:  Worsening dyspnea, abnormal echo  Primary Care Physician: Troy Roche, NP  HPI:  Troy Hicks is a 86 y.o. male who is being seen today for the evaluation of establishing cardiolgist at the request of Troy Roche, NP. This is a pleasant 86 year old male veteran who was previously living in [place] for the past 40 years.  He is originally from [place].  Recently he moved down here with his wife who unfortunately recently fell and is currently hospitalized with hip fracture and A. fib.  His past medical history is significant for coronary artery disease status post coronary artery bypass grafting a number of years ago, ischemic cardiomyopathy with EF around 83 to 40%, SVT, diabetes, hypertension, lower extremity venous insufficiency and some mitral and tricuspid valvular heart disease.  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  Currently is asymptomatic denies any chest pain or worsening shortness of breath.  Family history significant for heart disease in his mother who died at age 86.  17-Apr-2018  Mr. Troy Hicks returns today for follow-up.  Actually this was not a routine visit.  His daughter brought him in because he has had worsening fatigue and shortness of breath.  Unfortunately his wife died in 2023-03-18.  He since then has been somewhat depressed.  He has been complaining of chronic back pain.  He is also been short of breath to her observation but denies any chest pain.  Blood pressures well controlled today.  He is actually had some weight loss.  His appetite has decreased.  He has seen a psychiatrist who prescribes medications to help with sleep and was placed on Zoloft.  08/26/2019  Mr. Troy Hicks is seen today for urgent follow-up.  He underwent an echocardiogram a for worsening shortness of breath and decreased exercise tolerance.  He had recently seen Beckie Busing, DNP in April  for preoperative evaluation of a perirectal abscess.  At the time he seemed to be doing well although repeat echo was recommended.  Over the past several months he has had some progressive dyspnea and overall decline according to his daughter.  The echo yesterday showed an LVEF less than 20% which is a significant decline from his last study which showed an EF of about 30 to 35%, that in and of itself was a decline.  He denies any anginal symptoms.  Unfortunately also has advanced chronic kidney disease at least stage IIIb-IV with recent metabolic profile demonstrating a GFR of 33.  When I previously saw him I recommended Lasix 80 mg twice daily however he is only been on 80 mg once daily.  PMHx:  Past Medical History:  Diagnosis Date  . Chronic kidney disease (CKD), stage III (moderate)    Per [facility]   . Coronary artery disease    Per [facility]   . Edema    Per [facility]   . Gait instability    Per [facility]   . Gout    Per [facility]   . Hypertension    Per [facility] Patient [facility]   . Insulin dependent diabetes mellitus    Per [facility] Patient [facility]   . Osteoarthritis    Per [facility]   . Syncope 2015   Per [facility] Patient [facility]   . TIA (transient ischemic attack) 2010   Per [facility] [place]     Past Surgical History:  Procedure Laterality Date  . CORONARY ARTERY BYPASS GRAFT  1990   Per [facility] Patient [facility]   . HEMORRHOID SURGERY     over 25 to 40 years ago  . IVC FILTER INSERTION     Per [facility] Patient [facility]   . PILONIDAL CYST EXCISION  1944   Per [facility] [place]   . SKIN SURGERY  06/2018   Spot removed from head  . TONSILLECTOMY     Per New Patient Packet,[facility] (childhood)     FAMHx:  Family History  Problem Relation Age of Onset  . Diabetes Mother   . Heart disease Mother   . Throat cancer Father   . Cirrhosis Son   . Alcoholism Son   . Ovarian cancer Daughter   .  Depression Daughter   . Alcoholism Daughter   . Colon cancer Neg Hx     SOCHx:   reports that he quit smoking about 62 years ago. He has a 7.50 pack-year smoking history. He has never used smokeless tobacco. He reports current alcohol use. He reports previous drug use.  ALLERGIES:  Allergies  Allergen Reactions  . Codeine     ROS: Pertinent items noted in HPI and remainder of comprehensive ROS otherwise negative.  HOME MEDS: Current Outpatient Medications on File Prior to Visit  Medication Sig Dispense Refill  . acetaminophen (TYLENOL) 500 MG tablet Take 500 mg by mouth 2 (two) times daily.     Marland Kitchen allopurinol (ZYLOPRIM) 100 MG tablet Take 100 mg by mouth daily.    . Alogliptin Benzoate 25 MG TABS Take 1 tablet by mouth daily.    Marland Kitchen aspirin EC 81 MG tablet Take 81 mg by mouth daily.    . Colchicine 0.6 MG CAPS Take 1 capsule by mouth as directed. Give one tablet by mouth as needed for gout take 1 tablet at first sign of gout then take 1 tablet 2 hours later. Only give every 14 days due to kidney functions    . colestipol (COLESTID) 1 g tablet Take 1 g by mouth as directed.     Marland Kitchen Dextran 70-Hypromellose, PF, 0.1-0.3 % SOLN Apply 1 drop to eye every 4 (four) hours as needed (apply to both eyes).    . insulin glargine (LANTUS) 100 UNIT/ML injection Inject 15 Units into the skin at bedtime.     Marland Kitchen levothyroxine (SYNTHROID, LEVOTHROID) 100 MCG tablet Take 100 mcg by mouth daily before breakfast.    . lipase/protease/amylase (CREON) 36000 UNITS CPEP capsule Take 36,000 Units by mouth in the morning and at bedtime.    Marland Kitchen loperamide (IMODIUM) 2 MG capsule Take 2 mg by mouth as needed for diarrhea or loose stools.    Marland Kitchen losartan (COZAAR) 50 MG tablet Take 50 mg by mouth daily.     . Multiple Vitamins-Minerals (PRESERVISION AREDS) CAPS Take 1 capsule by mouth 2 (two) times daily.    . mupirocin ointment (BACTROBAN) 2 % SMARTSIG:1 Application Topical 2-3 Times Daily    . Potassium Chloride Crys ER  (KLOR-CON M20 PO) Take 1 tablet by mouth daily.    Marland Kitchen saccharomyces boulardii (FLORASTOR) 250 MG capsule Take 1 capsule (250 mg total) by mouth 2 (two) times daily. 60 capsule 0  . sertraline (ZOLOFT) 25 MG tablet Take 25 mg by mouth daily.    . traMADol (ULTRAM) 50 MG tablet Take 50 mg by mouth in the morning and at bedtime.     . traZODone (DESYREL) 50 MG tablet Take 50 mg by  mouth at bedtime.     No current facility-administered medications on file prior to visit.    LABS/IMAGING: No results found for this or any previous visit (from the past 48 hour(s)). ECHOCARDIOGRAM COMPLETE  Result Date: 08/25/2019    ECHOCARDIOGRAM REPORT   Patient Name:   AUNDRE HIETALA  Date of Exam: 08/25/2019 Medical Rec #:  [id number]     Height:       66.0 in Accession #:    [id number]    Weight:       169.8 lb Date of Birth:  [birth year]      BSA:          1.865 m Patient Age:    52 years      BP:           140/80 mmHg Patient Gender: M             HR:           79 bpm. Exam Location:  [address] Procedure: 2D Echo, Color Doppler and Cardiac Doppler Indications:    I25.5 Ischemic Cardiomyopathy  History:        Patient has prior history of Echocardiogram examinations, most                 recent 04/17/2018. Ischemic cardiomyopathy, CAD, Prior CABG, DVT                 s/p IVC filter, Arrythmias:SVT, Signs/Symptoms:Shortness of                 Breath and Lower extremity edema.; Risk Factors:Diabetes,                 Hypertension and Former Smoker. Previous echo revealed 35% mild                 bilateral atrial enlargement.  Sonographer:    Lenard Galloway BA, RDCS Referring Phys: [place]  1. Global hypokinesis with posterolateral akinesis. Compared with the echo 09/348, systolic function has worsened. Left ventricular ejection fraction, by estimation, is <20%. The left ventricle has severely decreased function. The left ventricle demonstrates global hypokinesis. The left ventricular internal cavity  size was mildly dilated. Left ventricular diastolic parameters are consistent with Grade I diastolic dysfunction (impaired relaxation). Elevated left ventricular end-diastolic pressure.  2. Right ventricular systolic function is mildly reduced. The right ventricular size is normal.  3. The mitral valve is normal in structure. Trivial mitral valve regurgitation. No evidence of mitral stenosis.  4. The aortic valve is tricuspid. Aortic valve regurgitation is not visualized. No aortic stenosis is present.  5. Aortic dilatation noted. There is mild dilatation at the level of the sinuses of Valsalva measuring 39 mm.  6. The inferior vena cava is normal in size with greater than 50% respiratory variability, suggesting right atrial pressure of 3 mmHg. FINDINGS  Left Ventricle: Global hypokinesis with posterolateral akinesis. Compared with the echo 0/9381, systolic function has worsened. Left ventricular ejection fraction, by estimation, is <20%. The left ventricle has severely decreased function. The left ventricle demonstrates global hypokinesis. The left ventricular internal cavity size was mildly dilated. There is no left ventricular hypertrophy. Left ventricular diastolic parameters are consistent with Grade I diastolic dysfunction (impaired relaxation). Elevated left ventricular end-diastolic pressure. Right Ventricle: The right ventricular size is normal. No increase in right ventricular wall thickness. Right ventricular systolic function is mildly reduced. Left Atrium: Left atrial size was normal in size. Right Atrium: Right  atrial size was normal in size. Pericardium: There is no evidence of pericardial effusion. Mitral Valve: The mitral valve is normal in structure. Normal mobility of the mitral valve leaflets. Trivial mitral valve regurgitation. No evidence of mitral valve stenosis. Tricuspid Valve: The tricuspid valve is normal in structure. Tricuspid valve regurgitation is not demonstrated. No evidence of  tricuspid stenosis. Aortic Valve: The aortic valve is tricuspid. Aortic valve regurgitation is not visualized. No aortic stenosis is present. Pulmonic Valve: The pulmonic valve was normal in structure. Pulmonic valve regurgitation is not visualized. No evidence of pulmonic stenosis. Aorta: Aortic dilatation noted. There is mild dilatation at the level of the sinuses of Valsalva measuring 39 mm. Venous: The inferior vena cava is normal in size with greater than 50% respiratory variability, suggesting right atrial pressure of 3 mmHg. IAS/Shunts: No atrial level shunt detected by color flow Doppler.  LEFT VENTRICLE PLAX 2D LVIDd:         5.72 cm  Diastology LVIDs:         5.34 cm  LV e' medial:   3.92 cm/s LV PW:         0.88 cm  LV E/e' medial: 18.0 LV IVS:        1.00 cm LVOT diam:     2.50 cm LV SV:         69 LV SV Index:   37 LVOT Area:     4.91 cm  RIGHT VENTRICLE RV Basal diam:  4.25 cm RV Mid diam:    2.46 cm RV S prime:     11.28 cm/s TAPSE (M-mode): 2.2 cm LEFT ATRIUM             Index LA diam:        4.10 cm 2.20 cm/m LA Vol (A2C):   34.5 ml 18.49 ml/m LA Vol (A4C):   31.6 ml 16.94 ml/m LA Biplane Vol: 34.9 ml 18.71 ml/m  AORTIC VALVE LVOT Vmax:   72.17 cm/s LVOT Vmean:  50.800 cm/s LVOT VTI:    0.140 m  AORTA Ao Root diam: 3.85 cm Ao Asc diam:  3.30 cm MITRAL VALVE MV Area (PHT):             SHUNTS MV Decel Time: 209 msec    Systemic VTI:  0.14 m MV E velocity: 70.47 cm/s  Systemic Diam: 2.50 cm Skeet Latch MD Electronically signed by Skeet Latch MD Signature Date/Time: 08/25/2019/10:22:10 PM    Final     LIPID PANEL: No results found for: CHOL, TRIG, HDL, CHOLHDL, VLDL, LDLCALC, LDLDIRECT  WEIGHTS: Wt Readings from Last 3 Encounters:  08/26/19 170 lb 3.2 oz (77.2 kg)  07/21/19 169 lb 12.8 oz (77 kg)  06/22/19 173 lb 8 oz (78.7 kg)    VITALS: BP (!) 154/78   Pulse 81   Ht 5\' 6"  (1.676 m)   Wt 170 lb 3.2 oz (77.2 kg)   SpO2 97%   BMI 27.47 kg/m   EXAM: General appearance:  alert and no distress Neck: JVD - several cm above sternal notch, no carotid bruit and thyroid not enlarged, symmetric, no tenderness/mass/nodules Lungs: diminished breath sounds bilaterally and rales RLL Heart: regular rate and rhythm, S1, S2 normal and systolic murmur: early systolic 2/6, blowing at apex Abdomen: soft, non-tender; bowel sounds normal; no masses,  no organomegaly Extremities: edema 1+ bilateral lower extremity edema Pulses: 2+ and symmetric Skin: Skin color, texture, turgor normal. No rashes or lesions Neurologic: Mental status: Alert, oriented, thought content  appropriate, Hard of hearing Psych: Pleasant  EKG: Deferred  ASSESSMENT: 1. Acute on chronic systolic congestive heart failure, LVEF now less than 20% (08/2019) 2. Coronary artery disease status post CABG 3. Ischemic cardiomyopathy EF 35 to 40%, reduced to 30 to 35% (03/2018) 4. History of PSVT 5. Type 2 diabetes 6. Hypertension 7. Dyslipidemia 8. History of DVT status post IVC filter 9. CKD 3B/4  PLAN: 1.   Mr. Antu has had a progressive decline in LVEF with regional wall motion abnormalities.  He is noted to have had prior MIs and is status post CABG.  I suspect he has had bypass graft dysfunction leading to his worsening cardiomyopathy.  Options for treatment are extremely limited, given advanced chronic kidney disease.  For now I recommend increasing his Lasix from 80 mg daily to twice daily.  We will need to repeat renal function in about a week.  He is not really a candidate for advanced therapy such as ACE inhibitor, ARB or Arni, given his renal dysfunction.  Additionally I would not consider him a cath candidate.  Age is also a significant factor however less limiting.  I think his options are very limited as he is not a candidate for dialysis and further evaluation with imaging may likely lead to that if it involves contrast.  We discussed this today and he wishes to be a DNR.  His daughter says she has a  living will.  He is agreeable to palliative care evaluation we will try to arrange that through [facility] assisted living.  I think his prognosis long-term is very limited and at this point he may qualify for hospice without options for LV recovery.  Plan follow-up with me in about 3 months.  Pixie Casino, MD, [facility], [place] Director of the Advanced Lipid Disorders &  Cardiovascular Risk Reduction Clinic Diplomate of the American Board of Clinical Lipidology Attending Cardiologist  Direct Dial: [phone]  Fax: [phone]  Website:  [email or web address] 08/26/2019, 10:38 PM

## 2019-08-26 NOTE — Telephone Encounter (Signed)
-----   Message from Erlene Quan, [place] sent at 08/26/2019  7:54 AM EDT ----- I'm covering Kathryn's in box.  Not sure what to do with this echo report- Darcy Barbara I think this patient needs an office visit with Dr Debara Pickett or Curt Bears ASAP to discuss this.  Kerin Ransom PA-C 08/26/2019 7:55 AM   ----- Message ----- From: Interface, [employer] Sent: 08/25/2019  10:22 PM EDT To: Lendon Colonel, NP

## 2019-08-26 NOTE — Telephone Encounter (Signed)
Spoke to daughter, [name] (ok per DPR)-patient is complaining of increased SOB and per daugher NP at office yesterday listened to lungs and heard crackles as well.       Made aware of echo results and recommendations.    OV scheduled to see Dr. Debara Pickett today at 2 PM.  Daughter aware and verbalized understanding.

## 2019-08-26 NOTE — Patient Instructions (Addendum)
Medication Instructions:  INCREASE lasix to 80mg  twice daily  *If you need a refill on your cardiac medications before your next appointment, please call your pharmacy*   Lab Work: BMET (non-fasting lab test) in 1 week  If you have labs (blood work) drawn today and your tests are completely normal, you will receive your results only by: Marland Kitchen MyChart Message (if you have MyChart) OR . A paper copy in the mail If you have any lab test that is abnormal or we need to change your treatment, we will call you to review the results.   Testing/Procedures: NONE   Follow-Up: At [facility], you and your health needs are our priority.  As part of our continuing mission to provide you with exceptional heart care, we have created designated Provider Care Teams.  These Care Teams include your primary Cardiologist (physician) and Advanced Practice Providers (APPs -  Physician Assistants and Nurse Practitioners) who all work together to provide you with the care you need, when you need it.  We recommend signing up for the patient portal called "MyChart".  Sign up information is provided on this After Visit Summary.  MyChart is used to connect with patients for Virtual Visits (Telemedicine).  Patients are able to view lab/test results, encounter notes, upcoming appointments, etc.  Non-urgent messages can be sent to your provider as well.   To learn more about what you can do with MyChart, go to [email or web address].    Your next appointment:   3 month(s)  The format for your next appointment:   In Person  Provider:   You may see Pixie Casino, MD or one of the following Advanced Practice Providers on your designated Care Team:    Almyra Deforest, PA-C  Fabian Sharp, PA-C or   Roby Lofts, [place]    Other Instructions  [facility] - palliative care

## 2019-08-31 NOTE — Progress Notes (Addendum)
June 15th, 2021 [facility] Palliative Care Consult Note Telephone: [phone]  Fax: [phone]  PATIENT NAME: Troy Hicks DOB: [birth year] MRN: [id number]  [place] Rm 4  PRIMARY CARE PROVIDER:   Welford Roche, NP  REFERRING PROVIDER:  Welford Roche, NP Troy Hicks,  Troy Hicks [address]   Dr. Lyman Hicks (Cardiology) 08/26/19)  Dr. Boston Hicks (Oncology)  [facility] Troy Pluck NP  RESPONSIBLE PARTY:   Troy Hicks, Troy Hicks (Daughter) [phone] [facility] Phone)  ASSESSMENT / RECOMMENDATIONS:  1. Advance Care Planning: A. Directives: POA: (dtr) Troy Hicks  (Troy Hicks) [employer]. In the presence of his daughter Troy Hicks, discussed role of CPR in the event of a cardiopulmonary arrest. Patient states he would want an initial attempt at resuscitation, then would leave f/u decisions to his daughter Troy Hicks. I did discuss with patient poor prognosis for a successful outcome in the setting of his age, underlying heart dysfunction. But at this time, as he is feeling so well, he can't see the sense of not at least having an initial attempt. Review of Dr. Lysbeth Hicks notes (Cardiology) felt limited long-term prognosis; may qualify for hospice.  B. Goals of Care: To maintain as high a functional level as possible.   2. Cognitive / Functional status: Patient is very [place]; has hearing aides with minimal improvement. He is alert and oriented. He feels he is in good health, and wonders how he can feel so well, and be told his heart and kidneys are damaged. H/O depression managed on Zoloft.  Patient is independent in hygiene, toileting, feeding, bathing, and dressing. He ambulates with walker with steady gait if he is careful and takes his time. He's had a few falls d/t tripping/LE weakness; most recently 3 weeks ago when tripped over a rug, and a few times previously when getting out of the shower.   States good appetite. his current weight is 171.2 lbs. at a  height of 5'6" his BMI is about 27kg/m2. He has lost 24 lbs over the last 6 months.   He has chronic lower back and shoulder arthritic pain well managed with Tramadol and Tylenol.   He was recently treated for signs of CHF with increase of Lasix from 80mg  qd to 80mg  bid, for the last 5 days. He appears compensated by exam today (clear lung fields; minimal LE edema, sats room air high 90's, no dyspnea with walking to dining room). His daughter is taking him for lab work today, results to be reviewed by his cardiologist. He has an EF <20% by recent echo, setting of chronic renal disease. Current management losartan 50mg  /d, Lasix. Good BP this am 108/60, HR 84. Reports he occasionally has to take a few deep breaths, at times not particularly associated with exertion.  3. Family Supports: dtr/POA Troy Hicks. Spouse deceased. Daughter Troy Hicks (ovarian cancer/ETOH) and son (ETOH/cirrhosis) deceased. Was living in [place] for past 40 years, originally form [place]. Resident of [place] for about a year.  4. Follow up Palliative Care Visit: in 1-2 months. Troy Hicks will call so she can arrange to be there as well (tentatively 10/27/2019 9:30am)  I spent 60 minutes providing this consultation from 9:30am to 10:30am. More than 50% of the time in this consultation was spent coordinating communication.   HISTORY OF PRESENT ILLNESS:  Troy Hicks is a 86 y.o.  male with h/o CAD (CABG), ischemic cardiomyopathy (EF by echo 08/25/19: <20%), acute on chronic systolic CHF, advanced chronic kidney disease stabe  IIIb-IV GFR 33, PSVT, diabetes, HTN, dyslipideia, lower extremity venous insufficiency, mitral/valvular heart disease, DVT (IVC filter), gout, osteoarthritis, TIA, mild thrombocytopenia (Troy Hicks; observation) Palliative Care was asked to help address goals of care.   CODE STATUS: Full code (addressed today)  PPS: 60%  HOSPICE ELIGIBILITY/DIAGNOSIS: TBD  PAST MEDICAL HISTORY:  Past Medical History:   Diagnosis Date  . Chronic kidney disease (CKD), stage III (moderate)    Per New Patient Packet,PSC   . Coronary artery disease    Per New Patient Packet,PSC   . Edema    Per New Patient Packet,PSC   . Gait instability    Per New Patient Packet,PSC   . Gout    Per New Patient Packet,PSC   . Hypertension    Per New Patient Packet,PSC   . Insulin dependent diabetes mellitus    Per New Patient Packet,PSC   . Osteoarthritis    Per New Patient Packet,PSC   . Syncope 2015   Per New Patient Packet,PSC   . TIA (transient ischemic attack) 2010   Per New Patient Packet,PSC     SOCIAL HX:  Social History   Tobacco Use  . Smoking status: Former Smoker    Packs/day: 0.50    Years: 15.00    Pack years: 7.50    Quit date: 03/19/1957    Years since quitting: 62.4  . Smokeless tobacco: Never Used  . Tobacco comment: 60 years ago as of 2019   Substance Use Topics  . Alcohol use: Yes    Comment: 1 drink per day    ALLERGIES:  Allergies  Allergen Reactions  . Codeine      PERTINENT MEDICATIONS:  Outpatient Encounter Medications as of 09/01/2019  Medication Sig  . acetaminophen (TYLENOL) 500 MG tablet Take 500 mg by mouth 2 (two) times daily.   Marland Kitchen allopurinol (ZYLOPRIM) 100 MG tablet Take 100 mg by mouth daily.  . Alogliptin Benzoate 25 MG TABS Take 1 tablet by mouth daily.  Marland Kitchen aspirin EC 81 MG tablet Take 81 mg by mouth daily.  . Colchicine 0.6 MG CAPS Take 1 capsule by mouth as directed. Give one tablet by mouth as needed for gout take 1 tablet at first sign of gout then take 1 tablet 2 hours later. Only give every 14 days due to kidney functions  . colestipol (COLESTID) 1 g tablet Take 1 g by mouth as directed.   Marland Kitchen Dextran 70-Hypromellose, PF, 0.1-0.3 % SOLN Apply 1 drop to eye every 4 (four) hours as needed (apply to both eyes).  . furosemide (LASIX) 80 MG tablet Take 1 tablet (80 mg total) by mouth 2 (two) times daily.  . insulin glargine (LANTUS) 100 UNIT/ML injection Inject 15  Units into the skin at bedtime.   Marland Kitchen levothyroxine (SYNTHROID, LEVOTHROID) 100 MCG tablet Take 100 mcg by mouth daily before breakfast.  . lipase/protease/amylase (CREON) 36000 UNITS CPEP capsule Take 36,000 Units by mouth in the morning and at bedtime.  Marland Kitchen loperamide (IMODIUM) 2 MG capsule Take 2 mg by mouth as needed for diarrhea or loose stools.  Marland Kitchen losartan (COZAAR) 50 MG tablet Take 50 mg by mouth daily.   . Multiple Vitamins-Minerals (PRESERVISION AREDS) CAPS Take 1 capsule by mouth 2 (two) times daily.  . mupirocin ointment (BACTROBAN) 2 % SMARTSIG:1 Application Topical 2-3 Times Daily  . Potassium Chloride Crys ER (KLOR-CON M20 PO) Take 1 tablet by mouth daily.  Marland Kitchen saccharomyces boulardii (FLORASTOR) 250 MG capsule Take 1 capsule (250 mg total)  by mouth 2 (two) times daily.  . sertraline (ZOLOFT) 25 MG tablet Take 25 mg by mouth daily.  . traMADol (ULTRAM) 50 MG tablet Take 50 mg by mouth in the morning and at bedtime.   . traZODone (DESYREL) 50 MG tablet Take 50 mg by mouth at bedtime.   No facility-administered encounter medications on file as of 09/01/2019.    PHYSICAL EXAM:   BP 108/60, HR 83, Sat 97%.  Well nourished, pleasantly conversant elderly male sitting in stuffed arm chair. HOH. His daughter Troy Hicks is in attendance Cardiovascular: regular rate and rhythm without MRG Pulmonary: clear ant/posterior lung fields Abdomen: soft, nontender, + bowel sounds Extremities: very mild LE edema to lower shin Skin: no rashes Neurological: Weakness but otherwise non-focal  Julianne Handler, NP

## 2019-09-01 ENCOUNTER — Non-Acute Institutional Stay: Payer: Medicare Other | Admitting: Internal Medicine

## 2019-09-01 ENCOUNTER — Encounter: Payer: Self-pay | Admitting: Internal Medicine

## 2019-09-01 ENCOUNTER — Other Ambulatory Visit: Payer: Self-pay

## 2019-09-01 VITALS — BP 108/60 | HR 84 | Resp 16 | Ht 66.0 in | Wt 171.2 lb

## 2019-09-01 DIAGNOSIS — Z515 Encounter for palliative care: Secondary | ICD-10-CM

## 2019-09-01 DIAGNOSIS — Z7189 Other specified counseling: Secondary | ICD-10-CM

## 2019-09-02 LAB — BASIC METABOLIC PANEL
BUN/Creatinine Ratio: 22 (ref 10–24)
BUN: 43 mg/dL — ABNORMAL HIGH (ref 10–36)
CO2: 23 mmol/L (ref 20–29)
Calcium: 9.2 mg/dL (ref 8.6–10.2)
Chloride: 102 mmol/L (ref 96–106)
Creatinine, Ser: 1.94 mg/dL — ABNORMAL HIGH (ref 0.76–1.27)
GFR calc Af Amer: 33 mL/min/{1.73_m2} — ABNORMAL LOW (ref >59)
GFR calc non Af Amer: 28 mL/min/{1.73_m2} — ABNORMAL LOW (ref >59)
Glucose: 179 mg/dL — ABNORMAL HIGH (ref 65–99)
Potassium: 5.2 mmol/L (ref 3.5–5.2)
Sodium: 140 mmol/L (ref 134–144)

## 2019-09-03 ENCOUNTER — Encounter: Payer: Self-pay | Admitting: Internal Medicine

## 2019-09-07 ENCOUNTER — Encounter: Payer: Self-pay | Admitting: Internal Medicine

## 2019-09-17 ENCOUNTER — Telehealth: Payer: Self-pay | Admitting: Internal Medicine

## 2019-09-17 NOTE — Telephone Encounter (Signed)
Troy Hicks is sending records request for patients last OV with Dr. Debara Pickett and last echo.

## 2019-09-22 ENCOUNTER — Telehealth: Payer: Self-pay | Admitting: Internal Medicine

## 2019-09-22 NOTE — Telephone Encounter (Signed)
Patient's daughter is calling for lab results. She wants to know what GFR stands for. Please leave detailed message if she does not answer.

## 2019-09-22 NOTE — Telephone Encounter (Signed)
Left message on daughter's VM (OK per DPR) that GFR is glomerular filtration rate - measures kidney function, kidney disease if present

## 2019-09-23 ENCOUNTER — Encounter (HOSPITAL_COMMUNITY): Payer: Self-pay

## 2019-09-23 ENCOUNTER — Other Ambulatory Visit: Payer: Self-pay

## 2019-09-23 ENCOUNTER — Emergency Department (HOSPITAL_COMMUNITY): Payer: Medicare Other

## 2019-09-23 ENCOUNTER — Emergency Department (HOSPITAL_COMMUNITY)
Admission: EM | Admit: 2019-09-23 | Discharge: 2019-09-23 | Disposition: A | Discharge status: Home / Self Care | Payer: Medicare Other | Attending: Emergency Medicine | Admitting: Emergency Medicine

## 2019-09-23 DIAGNOSIS — R531 Weakness: Secondary | ICD-10-CM | POA: Diagnosis present

## 2019-09-23 DIAGNOSIS — Z87891 Personal history of nicotine dependence: Secondary | ICD-10-CM | POA: Diagnosis not present

## 2019-09-23 DIAGNOSIS — I2510 Atherosclerotic heart disease of native coronary artery without angina pectoris: Secondary | ICD-10-CM | POA: Diagnosis not present

## 2019-09-23 DIAGNOSIS — R0602 Shortness of breath: Secondary | ICD-10-CM | POA: Insufficient documentation

## 2019-09-23 DIAGNOSIS — I129 Hypertensive chronic kidney disease with stage 1 through stage 4 chronic kidney disease, or unspecified chronic kidney disease: Secondary | ICD-10-CM | POA: Insufficient documentation

## 2019-09-23 DIAGNOSIS — I4891 Unspecified atrial fibrillation: Secondary | ICD-10-CM | POA: Diagnosis not present

## 2019-09-23 DIAGNOSIS — Z7982 Long term (current) use of aspirin: Secondary | ICD-10-CM | POA: Diagnosis not present

## 2019-09-23 DIAGNOSIS — N183 Chronic kidney disease, stage 3 unspecified: Secondary | ICD-10-CM | POA: Insufficient documentation

## 2019-09-23 DIAGNOSIS — Z79899 Other long term (current) drug therapy: Secondary | ICD-10-CM | POA: Diagnosis not present

## 2019-09-23 LAB — BASIC METABOLIC PANEL
Anion gap: 10 (ref 5–15)
BUN: 47 mg/dL — ABNORMAL HIGH (ref 8–23)
CO2: 24 mmol/L (ref 22–32)
Calcium: 8.9 mg/dL (ref 8.9–10.3)
Chloride: 101 mmol/L (ref 98–111)
Creatinine, Ser: 2.1 mg/dL — ABNORMAL HIGH (ref 0.61–1.24)
GFR calc Af Amer: 30 mL/min — ABNORMAL LOW (ref >60)
GFR calc non Af Amer: 26 mL/min — ABNORMAL LOW (ref >60)
Glucose, Bld: 165 mg/dL — ABNORMAL HIGH (ref 70–99)
Potassium: 4 mmol/L (ref 3.5–5.1)
Sodium: 135 mmol/L (ref 135–145)

## 2019-09-23 LAB — URINALYSIS, ROUTINE W REFLEX MICROSCOPIC
Bilirubin Urine: NEGATIVE
Glucose, UA: NEGATIVE mg/dL
Hgb urine dipstick: NEGATIVE
Ketones, ur: NEGATIVE mg/dL
Leukocytes,Ua: NEGATIVE
Nitrite: NEGATIVE
Protein, ur: NEGATIVE mg/dL
Specific Gravity, Urine: 1.009 (ref 1.005–1.030)
pH: 5 (ref 5.0–8.0)

## 2019-09-23 LAB — CBC
HCT: 36.9 % — ABNORMAL LOW (ref 39.0–52.0)
Hemoglobin: 12 g/dL — ABNORMAL LOW (ref 13.0–17.0)
MCH: 32.2 pg (ref 26.0–34.0)
MCHC: 32.5 g/dL (ref 30.0–36.0)
MCV: 98.9 fL (ref 80.0–100.0)
Platelets: 105 10*3/uL — ABNORMAL LOW (ref 150–400)
RBC: 3.73 MIL/uL — ABNORMAL LOW (ref 4.22–5.81)
RDW: 14.3 % (ref 11.5–15.5)
WBC: 8.8 10*3/uL (ref 4.0–10.5)
nRBC: 0 % (ref 0.0–0.2)

## 2019-09-23 LAB — BRAIN NATRIURETIC PEPTIDE: B Natriuretic Peptide: 230.4 pg/mL — ABNORMAL HIGH (ref 0.0–100.0)

## 2019-09-23 LAB — MAGNESIUM: Magnesium: 2.2 mg/dL (ref 1.7–2.4)

## 2019-09-23 NOTE — ED Notes (Signed)
Pt given urinal, aware a urine sample has been requested from [place], MD. Pts daughter remains outside the room at this time.

## 2019-09-23 NOTE — ED Triage Notes (Signed)
Pt comes via [name] from [facility] for feeling fatigue and lethargic, took BP and it was 80/50, new afib, no hx

## 2019-09-23 NOTE — ED Provider Notes (Signed)
[place] [facility] EMERGENCY DEPARTMENT Provider Note   CSN: [id number] Arrival date & time: 09/23/19  0026     History Chief Complaint  Patient presents with  . Atrial Fibrillation    Troy Hicks is a 86 y.o. male.  HPI     This is a 86 year old male who presents with generalized weakness and low blood pressure from his living facility.  Patient's daughter is at bedside and helps provide history.  She reports that "he just did not have a good day."  He reports that he felt generally weak.  They took his blood pressure at his living facility was 80/50.  However, in route and here his blood pressures have been normal at 145/85.  Patient denies chest pain, shortness of breath.  Reports improved lower extremity swelling after increasing Lasix several weeks ago.  He was brought by EMS and noted to be in an irregular rhythm.  No history of atrial fibrillation.  Patient denies any recent illnesses, cough, fevers, urinary symptoms.  Currently he is asymptomatic.  Chart reviewed.  Primary cardiologist is Dr. Debara Pickett.  There is also palliative care note which indicates limited resuscitation.  I had a long discussion with the patient regarding his CODE STATUS.  Patient does not seem to understand that if he were to receive CPR his chances of a meaningful recovery off of a ventilator would be very low.  After a frank discussion with he and his daughter, he conceded that he would not want to live on a ventilator and if CPR meant that he would go on a ventilator, he did not want that.  He and his daughter both feel that being DNR is appropriate.  Past Medical History:  Diagnosis Date  . Chronic kidney disease (CKD), stage III (moderate)    Per New Patient Packet,PSC   . Coronary artery disease    Per New Patient Packet,PSC   . Edema    Per New Patient Packet,PSC   . Gait instability    Per New Patient Packet,PSC   . Gout    Per New Patient Packet,PSC   . Hypertension    Per New  Patient Packet,PSC   . Insulin dependent diabetes mellitus    Per New Patient Packet,PSC   . Osteoarthritis    Per New Patient Packet,PSC   . Syncope 2015   Per New Patient Packet,PSC   . TIA (transient ischemic attack) 2010   Per New Patient [place]     Patient Active Problem List   Diagnosis Date Noted  . Thrombocytopenia ([place]) 07/21/2019  . Perianal abscess 05/14/2019  . Diarrhea 05/14/2019  . 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 Osteoarthritis of glenohumeral joint 11/26/2018  . Pain in joint of left shoulder 11/25/2018  . Pain in joint of right shoulder 11/25/2018  . Pain in elbow 11/25/2018  . Shortness of breath 04/10/2018  . Ischemic cardiomyopathy 04/10/2018    Past Surgical History:  Procedure Laterality Date  . CORONARY ARTERY BYPASS GRAFT  1990   Per New Patient Packet,PSC   . HEMORRHOID SURGERY     over 25 to 40 years ago  . IVC FILTER INSERTION     Per New Patient Packet,PSC   . PILONIDAL CYST EXCISION  1944   Per New Patient [place]   . SKIN SURGERY  06/2018   Spot removed from head  . TONSILLECTOMY     Per New Patient Packet,PSC (childhood)        Family History  Problem Relation  Age of Onset  . Diabetes Mother   . Heart disease Mother   . Throat cancer Father   . Cirrhosis Son   . Alcoholism Son   . Ovarian cancer Daughter   . Depression Daughter   . Alcoholism Daughter   . Colon cancer Neg Hx     Social History   Tobacco Use  . Smoking status: Former Smoker    Packs/day: 0.50    Years: 15.00    Pack years: 7.50    Quit date: 03/19/1957    Years since quitting: 62.5  . Smokeless tobacco: Never Used  . Tobacco comment: 60 years ago as of 2019   Vaping Use  . Vaping Use: Never used  Substance Use Topics  . Alcohol use: Yes    Comment: 1 drink per day  . Drug use: Not Currently    Home Medications Prior to Admission medications   Medication Sig Start Date End Date Taking? Authorizing Provider  acetaminophen (TYLENOL) 500  MG tablet Take 500 mg by mouth 3 (three) times daily.    Yes [provider]  allopurinol (ZYLOPRIM) 100 MG tablet Take 100 mg by mouth daily.   Yes [provider]  Alogliptin Benzoate 25 MG TABS Take 25 mg by mouth daily.    Yes [provider]  aspirin EC 81 MG tablet Take 81 mg by mouth daily.   Yes [provider]  colchicine 0.6 MG tablet Take 0.6 mg by mouth See admin instructions. Give 1 tablet at the first sign of gout, then give another tablet 2 hours later. Only give every 14 days due to kidney function,   Yes [provider]  colestipol (COLESTID) 1 g tablet Take 1 g by mouth every other day.    Yes [provider]  Dextran 70-Hypromellose, PF, 0.1-0.3 % SOLN Apply 1 drop to eye every 4 (four) hours as needed (apply to both eyes).   Yes [provider]  furosemide (LASIX) 80 MG tablet Take 1 tablet (80 mg total) by mouth 2 (two) times daily. 08/26/19  Yes Hilty, Nadean Corwin, MD  Glycerin-Hypromellose-PEG 400 (DRY EYE RELIEF DROPS) 0.2-0.2-1 % SOLN Place 2 drops into both eyes 4 (four) times daily.   Yes [provider]  insulin glargine (LANTUS) 100 UNIT/ML injection Inject 18 Units into the skin at bedtime.    Yes [provider]  levothyroxine (SYNTHROID, LEVOTHROID) 100 MCG tablet Take 100 mcg by mouth daily before breakfast.   Yes [provider]  lipase/protease/amylase (CREON) 36000 UNITS CPEP capsule Take 36,000 Units by mouth 2 (two) times daily.    Yes [provider]  loperamide (IMODIUM) 2 MG capsule Take 2 mg by mouth as needed for diarrhea or loose stools. Do not exceed 6 doses in 24 hours.   Yes [provider]  losartan (COZAAR) 50 MG tablet Take 50 mg by mouth daily.    Yes [provider]  mirtazapine (REMERON) 7.5 MG tablet Take 7.5 mg by mouth once.   Yes [provider]  Multiple Vitamins-Minerals (PRESERVISION AREDS) CAPS Take 1 capsule by mouth 2  (two) times daily.   Yes [provider]  Potassium Chloride Crys ER (KLOR-CON M20 PO) Take 1 tablet by mouth daily.   Yes [provider]  sertraline (ZOLOFT) 25 MG tablet Take 25 mg by mouth daily.   Yes [provider]  traMADol (ULTRAM) 50 MG tablet Take 50 mg by mouth in the morning and at bedtime.  Yes [provider]  traZODone (DESYREL) 50 MG tablet Take 50 mg by mouth at bedtime.   Yes [provider]  saccharomyces boulardii (FLORASTOR) 250 MG capsule Take 1 capsule (250 mg total) by mouth 2 (two) times daily. Patient not taking: Reported on 09/23/2019 05/15/19   Noralyn Pick, NP    Allergies    Codeine  Review of Systems   Review of Systems  Constitutional: Negative for fever.  Respiratory: Negative for shortness of breath.   Cardiovascular: Positive for leg swelling. Negative for chest pain.  Gastrointestinal: Negative for abdominal pain, nausea and vomiting.  Genitourinary: Negative for dysuria.  Neurological: Negative for light-headedness.  All other systems reviewed and are negative.   Physical Exam Updated Vital Signs BP (!) 143/78   Pulse 90   Temp (!) 97.4 F (36.3 C) (Oral)   Resp (!) 22   SpO2 97%   Physical Exam Vitals and nursing note reviewed.  Constitutional:      Appearance: He is well-developed.     Comments: Elderly, chronically ill-appearing, no acute distress  HENT:     Head: Normocephalic and atraumatic.     Mouth/Throat:     Mouth: Mucous membranes are moist.  Eyes:     Pupils: Pupils are equal, round, and reactive to light.  Cardiovascular:     Rate and Rhythm: Normal rate. Rhythm irregular.     Heart sounds: Normal heart sounds. No murmur heard.   Pulmonary:     Effort: Pulmonary effort is normal. No respiratory distress.     Breath sounds: Normal breath sounds. No wheezing.  Abdominal:     General: Bowel sounds are normal.     Palpations: Abdomen is soft.     Tenderness:  There is no abdominal tenderness. There is no rebound.  Musculoskeletal:     Cervical back: Neck supple.     Right lower leg: Edema present.     Left lower leg: Edema present.  Lymphadenopathy:     Cervical: No cervical adenopathy.  Skin:    General: Skin is warm and dry.  Neurological:     Mental Status: He is alert and oriented to person, place, and time.  Psychiatric:        Mood and Affect: Mood normal.     ED Results / Procedures / Treatments   Labs (all labs ordered are listed, but only abnormal results are displayed) Labs Reviewed  BASIC METABOLIC PANEL - Abnormal; Notable for the following components:      Result Value   Glucose, Bld 165 (*)    BUN 47 (*)    Creatinine, Ser 2.10 (*)    GFR calc non Af Amer 26 (*)    GFR calc Af Amer 30 (*)    All other components within normal limits  CBC - Abnormal; Notable for the following components:   RBC 3.73 (*)    Hemoglobin 12.0 (*)    HCT 36.9 (*)    Platelets 105 (*)    All other components within normal limits  BRAIN NATRIURETIC PEPTIDE - Abnormal; Notable for the following components:   B Natriuretic Peptide 230.4 (*)    All other components within normal limits  URINALYSIS, ROUTINE W REFLEX MICROSCOPIC - Abnormal; Notable for the following components:   Color, Urine STRAW (*)    All other components within normal limits  MAGNESIUM    EKG None  ED ECG REPORT   Date: 09/23/2019  Rate: 73  Rhythm: atrial fibrillation  QRS Axis: normal  Intervals: normal  ST/T Wave abnormalities: normal  Conduction Disutrbances:right bundle branch block  Narrative Interpretation:   Old EKG Reviewed: changes noted Old anterior/inferior infarct noted  I have personally reviewed the EKG tracing and agree with the computerized printout as noted.   Radiology DG Chest Port 1 View  Result Date: 09/23/2019 CLINICAL DATA:  History of atrial fibrillation with lethargy EXAM: PORTABLE CHEST 1 VIEW COMPARISON:  None. FINDINGS:  Cardiac shadow is enlarged. Postsurgical changes are noted. Lungs are well aerated with mild patchy basilar opacities left greater than right. No sizable effusion is noted. No bony abnormality is seen. IMPRESSION: Mild basilar opacities likely representing early infiltrate worse in the left base. Electronically Signed   By: Inez Catalina M.D.   On: 09/23/2019 01:16    Procedures Procedures (including critical care time)  Medications Ordered in ED Medications - No data to display  ED Course  I have reviewed the triage vital signs and the nursing notes.  Pertinent labs & imaging results that were available during my care of the patient were reviewed by me and considered in my medical decision making (see chart for details).  Clinical Course as of Sep 22 440  Wed Sep 23, 2019  [facility] with cardiology fellow.  Given the patient is rate controlled, he feels that this patient can be discharged with close follow-up with Dr. Debara Pickett.  Given recent cardiology notes and palliative care consultation, he would leave the decision to start anticoagulation to his primary cardiologist.  I feel this is reasonable as well.  Given that he is rate controlled, no need for rate control medications.  Will discuss with daughter.   [CH]    Clinical Course User Index [CH] Avrianna Smart, Barbette Hair, MD   MDM Rules/Calculators/A&P                           This patients CHA2DS2-VASc Score and unadjusted Ischemic Stroke Rate (% per year) is equal to 7.2 % stroke rate/year from a score of 5  Above score calculated as 1 point each if present [CHF, HTN, DM, Vascular=MI/PAD/Aortic Plaque, Age if 65-74, or Male] Above score calculated as 2 points each if present [Age > 75, or Stroke/TIA/TE]  Patient presents with generalized weakness.  Noted to be in atrial fibrillation.  1 isolated low blood pressure record living facility but otherwise his blood pressures here have been reassuring.  He is well-appearing for 89 and in no  acute distress.  Physical exam is fairly benign with the exception of an irregular heart rhythm.  He does have some lower extremity edema but this is reported to be improved from prior.  Known reduced EF.  Have extensively reviewed his chart.  He has both recent cardiology and palliative care notes.  Work-up initiated.  No significant metabolic derangements and creatinine at baseline.  Magnesium normal.  Chest x-ray independently reviewed by myself and shows mild basilar opacities concerning for infiltrate.  Given history and physical exam, would be more suspicious for pulmonary edema.  BNP is slightly elevated at 230.  He does not have an oxygen requirement.  EKG shows rate controlled atrial fibrillation without ischemic changes.  Patient without chest pain.  Doubt ACS.  Had a long discussion with patient and his daughter regarding [place].  Patient initially stated he wanted limited resuscitation but ultimately stated that he would not want to be on a ventilator.  I discussed with him  that I felt that CPR would ultimately destined him to be on a ventilator given his age and comorbidities.  He is in agreement that this is not what he would want.  He and his daughter have agreed to make him DNR.  They were provided with a portable DNR sheet signed by me.  Regarding his new onset atrial fibrillation.  I discussed with cardiology fellow.  Given goals of care and risk regarding anticoagulation, will defer to Dr. Debara Pickett for management.  Currently his rate controlled.  He and daughter are agreeable to plan.  We will plan for discharge with close cardiology follow-up.  Cardiology fellow to arrange for appointment.  After history, exam, and medical workup I feel the patient has been appropriately medically screened and is safe for discharge home. Pertinent diagnoses were discussed with the patient. Patient was given return precautions.   Final Clinical Impression(s) / ED Diagnoses Final diagnoses:  New onset  atrial fibrillation [facility])    Rx / DC Orders ED Discharge Orders         Ordered    Amb referral to [facility]  Reprint     09/23/19 0048           Merryl Hacker, MD 09/23/19 [phone]

## 2019-09-23 NOTE — Discharge Instructions (Addendum)
You were seen today for atrial fibrillation.  This is a new diagnosis for you.  At this time your labs look reassuring and you are rate controlled.  Decisions regarding rate control medications and blood thinners will be deferred to your primary cardiologist, Dr. Debara Pickett.  Follow-up in office within the next 24 to 48 hours.  You should be called with an appointment.  If not, call the office.  If you develop chest pain, shortness of breath, any new or worsening symptoms you should be reevaluated.

## 2019-09-23 NOTE — ED Notes (Signed)
Pt was ambulatory to the bathroom with a walker, per daughter, is weaker than normal but pt appears to be getting around well, steady on his feet. Pt sts he had a bowel movement and that there were no issues.   Pt assisted back to his room and is back in bed, comfort measures in place. Daughter remains at pts bedside

## 2019-09-24 ENCOUNTER — Telehealth: Payer: Self-pay | Admitting: Internal Medicine

## 2019-09-24 NOTE — Telephone Encounter (Signed)
Follow Up  Patient's daughter is returning call. States that she also has a medical question. Please give patient's daughter a call back.

## 2019-09-24 NOTE — Telephone Encounter (Signed)
Spoke with pt daughter, patient was recently in the hospital and has a new dx of atrial fib. There was mention of blood thinners and because of his fall risk she is wanting dr hilty"s option regarding maybe a 325 aspirin or okay to just do nothing. They have an appointment 09-30-19 and she is not even sure they need to keep that appointment. will forward to dr hilty to review and advise.

## 2019-09-24 NOTE — Telephone Encounter (Signed)
Daughter of pt wanted earlier appt with Dr. Debara Pickett. Also wanted to know if they can replace blood thinner to aspirin. Please call to discuss.

## 2019-09-24 NOTE — Telephone Encounter (Signed)
Called patient daughter- left voicemail to call back. Left call back number.

## 2019-09-24 NOTE — Telephone Encounter (Signed)
I saw the note from the hospital. Stay on aspirin 81 mg daily for now - would like to discuss anticoagulation at follow-up on 7/14.  Dr Lemmie Evens

## 2019-09-24 NOTE — Telephone Encounter (Signed)
Left detailed message for daughter of dr hilty's recommendation.

## 2019-09-30 ENCOUNTER — Ambulatory Visit: Payer: Medicare Other | Admitting: Internal Medicine

## 2019-09-30 ENCOUNTER — Encounter: Payer: Self-pay | Admitting: Internal Medicine

## 2019-09-30 ENCOUNTER — Other Ambulatory Visit: Payer: Self-pay

## 2019-09-30 VITALS — BP 134/62 | HR 85 | Ht 67.0 in | Wt 168.0 lb

## 2019-09-30 DIAGNOSIS — N1832 Chronic kidney disease, stage 3b: Secondary | ICD-10-CM | POA: Diagnosis not present

## 2019-09-30 DIAGNOSIS — I4891 Unspecified atrial fibrillation: Secondary | ICD-10-CM

## 2019-09-30 DIAGNOSIS — I2510 Atherosclerotic heart disease of native coronary artery without angina pectoris: Secondary | ICD-10-CM

## 2019-09-30 DIAGNOSIS — Z951 Presence of aortocoronary bypass graft: Secondary | ICD-10-CM | POA: Diagnosis not present

## 2019-09-30 DIAGNOSIS — I255 Ischemic cardiomyopathy: Secondary | ICD-10-CM

## 2019-09-30 NOTE — Patient Instructions (Addendum)
Medication Instructions:  Your physician recommends that you continue on your current medications as directed. Please refer to the Current Medication list given to you today.  *If you need a refill on your cardiac medications before your next appointment, please call your pharmacy*   Follow-Up: At [facility], you and your health needs are our priority.  As part of our continuing mission to provide you with exceptional heart care, we have created designated Provider Care Teams.  These Care Teams include your primary Cardiologist (physician) and Advanced Practice Providers (APPs -  Physician Assistants and Nurse Practitioners) who all work together to provide you with the care you need, when you need it.  We recommend signing up for the patient portal called "MyChart".  Sign up information is provided on this After Visit Summary.  MyChart is used to connect with patients for Virtual Visits (Telemedicine).  Patients are able to view lab/test results, encounter notes, upcoming appointments, etc.  Non-urgent messages can be sent to your provider as well.   To learn more about what you can do with MyChart, go to [email or web address].    Your next appointment:   September 9 @ 1:45pm

## 2019-09-30 NOTE — Progress Notes (Signed)
OFFICE CONSULT NOTE  Chief Complaint:  Follow-up visit  Primary Care Physician: [name], NP  HPI:  Troy Hicks is a 86 y.o. male who is being seen today for the evaluation of establishing cardiolgist at the request of [name]*. This is a pleasant 86 year old male veteran who was previously living in [place] for the past 40 years.  He is originally from [place].  Recently he moved down here with his wife who unfortunately recently fell and is currently hospitalized with hip fracture and A. fib.  His past medical history is significant for coronary artery disease status post coronary artery bypass grafting a number of years ago, ischemic cardiomyopathy with EF around 12 to 40%, SVT, diabetes, hypertension, lower extremity venous insufficiency and some mitral and tricuspid valvular heart disease.  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  Currently is asymptomatic denies any chest pain or worsening shortness of breath.  Family history significant for heart disease in his mother who died at age 93.  May 07, 2018  Troy Hicks returns today for follow-up.  Actually this was not a routine visit.  His daughter brought him in because he has had worsening fatigue and shortness of breath.  Unfortunately his wife died in 2023/04/07.  He since then has been somewhat depressed.  He has been complaining of chronic back pain.  He is also been short of breath to her observation but denies any chest pain.  Blood pressures well controlled today.  He is actually had some weight loss.  His appetite has decreased.  He has seen a psychiatrist who prescribes medications to help with sleep and was placed on Zoloft.  08/26/2019  Troy Hicks is seen today for urgent follow-up.  He underwent an echocardiogram a for worsening shortness of breath and decreased exercise tolerance.  He had recently seen Beckie Busing, DNP in April  for preoperative evaluation of a perirectal abscess.  At the time he seemed to be doing well although repeat echo was recommended.  Over the past several months he has had some progressive dyspnea and overall decline according to his daughter.  The echo yesterday showed an LVEF less than 20% which is a significant decline from his last study which showed an EF of about 30 to 35%, that in and of itself was a decline.  He denies any anginal symptoms.  Unfortunately also has advanced chronic kidney disease at least stage IIIb-IV with recent metabolic profile demonstrating a GFR of 33.  When I previously saw him I recommended Lasix 80 mg twice daily however he is only been on 80 mg once daily.  09/30/2019  Troy Hicks returns today for follow-up with his daughter.  Unfortunately was recently in the ER.  He had an episode of hypotension at his assisted living facility and it was taken to the emergency department found to be in A. fib.  The etiology of his hypotension was unclear.  After some discussion it was decided not to pursue any therapies for his A. fib given his significant cardiomyopathy which is end-stage and the fact that he had elected to be on palliative care.  He did sign DNR paperwork.  He is on low-dose aspirin.  He returns today and is in persistent A. fib which is rate controlled.  According to his daughter and himself he does not seem to be more symptomatic with the A. fib.  He is still able to walk around  at about the same pace.  We did discuss the increased risk of stroke without anticoagulation however I am very concerned about the risk of falls and given his age and decreased renal function that risk is significantly elevated.  In the interim he was seen by palliative care with [facility] care who will continue to follow with him.  PMHx:  Past Medical History:  Diagnosis Date   Chronic kidney disease (CKD), stage III (moderate)    Per New Patient Packet,PSC    Coronary artery disease     Per New Patient Packet,PSC    Edema    Per New Patient Packet,PSC    Gait instability    Per New Patient Packet,PSC    Gout    Per New Patient Packet,PSC    Hypertension    Per New Patient Packet,PSC    Insulin dependent diabetes mellitus    Per New Patient Packet,PSC    Osteoarthritis    Per New Patient [place]    Syncope 2015   Per New Patient Packet,PSC    TIA (transient ischemic attack) 2010   Per New Patient Packet,PSC     Past Surgical History:  Procedure Laterality Date   CORONARY ARTERY BYPASS GRAFT  1990   Per New Patient [place]     over 25 to 40 years ago   IVC FILTER INSERTION     Per New Patient [place]   Per New Patient [place]    SKIN SURGERY  06/2018   Spot removed from head   TONSILLECTOMY     Per New Patient Packet,PSC (childhood)     FAMHx:  Family History  Problem Relation Age of Onset   Diabetes Mother    Heart disease Mother    Throat cancer Father    Cirrhosis Son    Alcoholism Son    Ovarian cancer Daughter    Depression Daughter    Alcoholism Daughter    Colon cancer Neg Hx     SOCHx:   reports that he quit smoking about 62 years ago. He has a 7.50 pack-year smoking history. He has never used smokeless tobacco. He reports current alcohol use. He reports previous drug use.  ALLERGIES:  Allergies  Allergen Reactions   Codeine     ROS: Pertinent items noted in HPI and remainder of comprehensive ROS otherwise negative.  HOME MEDS: Current Outpatient Medications on File Prior to Visit  Medication Sig Dispense Refill   acetaminophen (TYLENOL) 500 MG tablet Take 500 mg by mouth 3 (three) times daily.      Alogliptin Benzoate 25 MG TABS Take 25 mg by mouth daily.      aspirin EC 81 MG tablet Take 81 mg by mouth daily.     colchicine 0.6 MG tablet Take 0.6 mg by mouth See admin instructions. Give 1 tablet at the first sign of gout, then give  another tablet 2 hours later. Only give every 14 days due to kidney function,     colestipol (COLESTID) 1 g tablet Take 1 g by mouth every other day.      Dextran 70-Hypromellose, PF, 0.1-0.3 % SOLN Apply 1 drop to eye every 4 (four) hours as needed (apply to both eyes).     furosemide (LASIX) 80 MG tablet Take 1 tablet (80 mg total) by mouth 2 (two) times daily. 180 tablet 3   Glycerin-Hypromellose-PEG 400 (DRY EYE RELIEF DROPS) 0.2-0.2-1 % SOLN Place 2  drops into both eyes 4 (four) times daily.     insulin glargine (LANTUS) 100 UNIT/ML injection Inject 18 Units into the skin at bedtime.      levothyroxine (SYNTHROID, LEVOTHROID) 100 MCG tablet Take 100 mcg by mouth daily before breakfast.     lipase/protease/amylase (CREON) 36000 UNITS CPEP capsule Take 36,000 Units by mouth 2 (two) times daily.      loperamide (IMODIUM) 2 MG capsule Take 2 mg by mouth as needed for diarrhea or loose stools. Do not exceed 6 doses in 24 hours.     losartan (COZAAR) 50 MG tablet Take 50 mg by mouth daily.      mirtazapine (REMERON) 7.5 MG tablet Take 7.5 mg by mouth once.     Multiple Vitamins-Minerals (PRESERVISION AREDS) CAPS Take 1 capsule by mouth 2 (two) times daily.     Potassium Chloride Crys ER (KLOR-CON M20 PO) Take 1 tablet by mouth daily.     sertraline (ZOLOFT) 25 MG tablet Take 25 mg by mouth daily.     traMADol (ULTRAM) 50 MG tablet Take 50 mg by mouth in the morning and at bedtime.      traZODone (DESYREL) 50 MG tablet Take 50 mg by mouth at bedtime.     No current facility-administered medications on file prior to visit.    LABS/IMAGING: No results found for this or any previous visit (from the past 48 hour(s)). No results found.  LIPID PANEL: No results found for: CHOL, TRIG, HDL, CHOLHDL, VLDL, LDLCALC, LDLDIRECT  WEIGHTS: Wt Readings from Last 3 Encounters:  09/01/19 171 lb 3.2 oz (77.7 kg)  08/26/19 170 lb 3.2 oz (77.2 kg)  07/21/19 169 lb 12.8 oz (77 kg)     VITALS: There were no vitals taken for this visit.  EXAM: General appearance: alert and no distress Neck: JVD - several cm above sternal notch, no carotid bruit and thyroid not enlarged, symmetric, no tenderness/mass/nodules Lungs: diminished breath sounds bilaterally and rales RLL Heart: regular rate and rhythm, S1, S2 normal and systolic murmur: early systolic 2/6, blowing at apex Abdomen: soft, non-tender; bowel sounds normal; no masses,  no organomegaly Extremities: edema 1+ bilateral lower extremity edema Pulses: 2+ and symmetric Skin: Skin color, texture, turgor normal. No rashes or lesions Neurologic: Mental status: Alert, oriented, thought content appropriate, Hard of hearing Psych: Pleasant  EKG:  A. fib with PVCs at 85, nonspecific IVCD-personally reviewed  ASSESSMENT: 1. New onset A. fib with CVR 2. Acute on chronic systolic congestive heart failure, LVEF now less than 20% (08/2019) 3. Coronary artery disease status post CABG 4. Ischemic cardiomyopathy EF 35 to 40%, reduced to 30 to 35% (03/2018) 5. History of PSVT 6. Type 2 diabetes 7. Hypertension 8. Dyslipidemia 9. 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 CKD 3B/4  PLAN: 1.   Troy Hicks was recently in the ER with new onset A. fib and controlled ventricular response.  Given his significant cardiomyopathy and advanced chronic kidney disease, I think he is not a good candidate for anticoagulation or procedures to try to reestablish sinus rhythm.  He seems to be intrinsically rate controlled despite not being on any AV nodal blocking medications.  He does not seem to be any more symptomatic than he was previously.  Based on this I would recommend we continue her current therapies.  We did discuss the increased stroke risk however I think it is not favorable from a risk-benefit standpoint to put him on anticoagulation.  He and his daughter  agree with this.  We will continue low-dose aspirin.  Plan our scheduled  follow-up in September after his birthday.  Troy Casino, MD, [facility], [place] Director of the Advanced Lipid Disorders &  Cardiovascular Risk Reduction Clinic Diplomate of the American Board of Clinical Lipidology Attending Cardiologist  Direct Dial: [phone]   Fax: [phone]  Website:  [email or web address] 09/30/2019, 1:45 PM

## 2019-11-26 ENCOUNTER — Other Ambulatory Visit: Payer: Self-pay

## 2019-11-26 ENCOUNTER — Ambulatory Visit: Payer: Medicare Other | Admitting: Internal Medicine

## 2019-11-26 ENCOUNTER — Encounter: Payer: Self-pay | Admitting: Internal Medicine

## 2019-11-26 VITALS — BP 130/78 | HR 81 | Temp 95.9°F | Ht 66.0 in | Wt 170.8 lb

## 2019-11-26 DIAGNOSIS — I2510 Atherosclerotic heart disease of native coronary artery without angina pectoris: Secondary | ICD-10-CM | POA: Diagnosis not present

## 2019-11-26 DIAGNOSIS — Z951 Presence of aortocoronary bypass graft: Secondary | ICD-10-CM

## 2019-11-26 DIAGNOSIS — I255 Ischemic cardiomyopathy: Secondary | ICD-10-CM | POA: Diagnosis not present

## 2019-11-26 DIAGNOSIS — I4891 Unspecified atrial fibrillation: Secondary | ICD-10-CM

## 2019-11-26 DIAGNOSIS — Z66 Do not resuscitate: Secondary | ICD-10-CM

## 2019-11-26 NOTE — Patient Instructions (Signed)
Medication Instructions:  No changes  *If you need a refill on your cardiac medications before your next appointment, please call your pharmacy*   Lab Work: Not needed If you have labs (blood work) drawn today and your tests are completely normal, you will receive your results only by: Marland Kitchen MyChart Message (if you have MyChart) OR . A paper copy in the mail If you have any lab test that is abnormal or we need to change your treatment, we will call you to review the results.   Testing/Procedures: Not needed   Follow-Up: At [facility], you and your health needs are our priority.  As part of our continuing mission to provide you with exceptional heart care, we have created designated Provider Care Teams.  These Care Teams include your primary Cardiologist (physician) and Advanced Practice Providers (APPs -  Physician Assistants and Nurse Practitioners) who all work together to provide you with the care you need, when you need it.  We recommend signing up for the patient portal called "MyChart".  Sign up information is provided on this After Visit Summary.  MyChart is used to connect with patients for Virtual Visits (Telemedicine).  Patients are able to view lab/test results, encounter notes, upcoming appointments, etc.  Non-urgent messages can be sent to your provider as well.   To learn more about what you can do with MyChart, go to [email or web address].    Your next appointment:   6 month(s)  The format for your next appointment:   In Person  Provider:   K. Mali Hilty, MD

## 2019-11-26 NOTE — Progress Notes (Signed)
OFFICE CONSULT NOTE  Chief Complaint:  Follow-up visit  Primary Care Physician: [name], NP  HPI:  Troy Hicks is a 86 y.o. male who is being seen today for the evaluation of establishing cardiolgist at the request of Welford Roche, NP. This is a pleasant 86 year old male veteran who was previously living in [place] for the past 40 years.  He is originally from [place].  Recently he moved down here with his wife who unfortunately recently fell and is currently hospitalized with hip fracture and A. fib.  His past medical history is significant for coronary artery disease status post coronary artery bypass grafting a number of years ago, ischemic cardiomyopathy with EF around 28 to 40%, SVT, diabetes, hypertension, lower extremity venous insufficiency and some mitral and tricuspid valvular heart disease.  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  Currently is asymptomatic denies any chest pain or worsening shortness of breath.  Family history significant for heart disease in his mother who died at age 50.  2018/05/05  Troy Hicks returns today for follow-up.  Actually this was not a routine visit.  His daughter brought him in because he has had worsening fatigue and shortness of breath.  Unfortunately his wife died in 05-Apr-2023.  He since then has been somewhat depressed.  He has been complaining of chronic back pain.  He is also been short of breath to her observation but denies any chest pain.  Blood pressures well controlled today.  He is actually had some weight loss.  His appetite has decreased.  He has seen a psychiatrist who prescribes medications to help with sleep and was placed on Zoloft.  08/26/2019  Troy Hicks is seen today for urgent follow-up.  He underwent an echocardiogram a for worsening shortness of breath and decreased exercise tolerance.  He had recently seen Beckie Busing, DNP in April for  preoperative evaluation of a perirectal abscess.  At the time he seemed to be doing well although repeat echo was recommended.  Over the past several months he has had some progressive dyspnea and overall decline according to his daughter.  The echo yesterday showed an LVEF less than 20% which is a significant decline from his last study which showed an EF of about 30 to 35%, that in and of itself was a decline.  He denies any anginal symptoms.  Unfortunately also has advanced chronic kidney disease at least stage IIIb-IV with recent metabolic profile demonstrating a GFR of 33.  When I previously saw him I recommended Lasix 80 mg twice daily however he is only been on 80 mg once daily.  09/30/2019  Troy Hicks returns today for follow-up with his daughter.  Unfortunately was recently in the ER.  He had an episode of hypotension at his assisted living facility and it was taken to the emergency department found to be in A. fib.  The etiology of his hypotension was unclear.  After some discussion it was decided not to pursue any therapies for his A. fib given his significant cardiomyopathy which is end-stage and the fact that he had elected to be on palliative care.  He did sign DNR paperwork.  He is on low-dose aspirin.  He returns today and is in persistent A. fib which is rate controlled.  According to his daughter and himself he does not seem to be more symptomatic with the A. fib.  He is still able to walk around  at about the same pace.  We did discuss the increased risk of stroke without anticoagulation however I am very concerned about the risk of falls and given his age and decreased renal function that risk is significantly elevated.  In the interim he was seen by palliative care with [facility] care who will continue to follow with him.  11/26/2019  Troy Hicks is seen today in follow-up with his daughter.  He reports no worsening shortness of breath or chest discomfort.  In general his weight has been  fairly stable.  He seems somewhat better on higher dose Lasix.  He is also on oxygen.  He has been seen by palliative care but is not electing active treatment at this time.  Creatinine recently was 2.1 in July.  PMHx:  Past Medical History:  Diagnosis Date   Chronic kidney disease (CKD), stage III (moderate)    Per New Patient Packet,PSC    Coronary artery disease    Per New Patient Packet,PSC    Edema    Per New Patient Packet,PSC    Gait instability    Per New Patient Packet,PSC    Gout    Per New Patient Packet,PSC    Hypertension    Per New Patient Packet,PSC    Insulin dependent diabetes mellitus    Per New Patient Packet,PSC    Osteoarthritis    Per New Patient [place]    Syncope 2015   Per New Patient Packet,PSC    TIA (transient ischemic attack) 2010   Per New Patient Packet,PSC     Past Surgical History:  Procedure Laterality Date   CORONARY ARTERY BYPASS GRAFT  1990   Per New Patient [place]     over 25 to 40 years ago   IVC FILTER INSERTION     Per New Patient [place]   Per New Patient [place]    SKIN SURGERY  06/2018   Spot removed from head   TONSILLECTOMY     Per New Patient Packet,PSC (childhood)     FAMHx:  Family History  Problem Relation Age of Onset   Diabetes Mother    Heart disease Mother    Throat cancer Father    Cirrhosis Son    Alcoholism Son    Ovarian cancer Daughter    Depression Daughter    Alcoholism Daughter    Colon cancer Neg Hx     SOCHx:   reports that he quit smoking about 62 years ago. He has a 7.50 pack-year smoking history. He has never used smokeless tobacco. He reports current alcohol use. He reports previous drug use.  ALLERGIES:  Allergies  Allergen Reactions   Codeine     ROS: Pertinent items noted in HPI and remainder of comprehensive ROS otherwise negative.  HOME MEDS: Current Outpatient Medications on File  Prior to Visit  Medication Sig Dispense Refill   acetaminophen (TYLENOL) 500 MG tablet Take 500 mg by mouth 3 (three) times daily.      Alogliptin Benzoate 25 MG TABS Take 25 mg by mouth daily.      aspirin EC 81 MG tablet Take 81 mg by mouth daily.     colchicine 0.6 MG tablet Take 0.6 mg by mouth See admin instructions. Give 1 tablet at the first sign of gout, then give another tablet 2 hours later. Only give every 14 days due to kidney function,     colestipol (COLESTID) 1 g  tablet Take 1 g by mouth every other day.      Dextran 70-Hypromellose, PF, 0.1-0.3 % SOLN Apply 1 drop to eye every 4 (four) hours as needed (apply to both eyes).     furosemide (LASIX) 80 MG tablet Take 1 tablet (80 mg total) by mouth 2 (two) times daily. 180 tablet 3   Glycerin-Hypromellose-PEG 400 (DRY EYE RELIEF DROPS) 0.2-0.2-1 % SOLN Place 2 drops into both eyes 4 (four) times daily.     insulin glargine (LANTUS) 100 UNIT/ML injection Inject 18 Units into the skin at bedtime.      levothyroxine (SYNTHROID, LEVOTHROID) 100 MCG tablet Take 100 mcg by mouth daily before breakfast.     lipase/protease/amylase (CREON) 36000 UNITS CPEP capsule Take 36,000 Units by mouth 2 (two) times daily.      loperamide (IMODIUM) 2 MG capsule Take 2 mg by mouth as needed for diarrhea or loose stools. Do not exceed 6 doses in 24 hours.     losartan (COZAAR) 50 MG tablet Take 50 mg by mouth daily.      mirtazapine (REMERON) 7.5 MG tablet Take 7.5 mg by mouth once.     Multiple Vitamins-Minerals (PRESERVISION AREDS) CAPS Take 1 capsule by mouth 2 (two) times daily.     Potassium Chloride Crys ER (KLOR-CON M20 PO) Take 1 tablet by mouth daily.     sertraline (ZOLOFT) 25 MG tablet Take 25 mg by mouth daily.     traMADol (ULTRAM) 50 MG tablet Take 50 mg by mouth in the morning and at bedtime.      traZODone (DESYREL) 50 MG tablet Take 50 mg by mouth at bedtime.     No current facility-administered medications on file  prior to visit.    LABS/IMAGING: No results found for this or any previous visit (from the past 48 hour(s)). No results found.  LIPID PANEL: No results found for: CHOL, TRIG, HDL, CHOLHDL, VLDL, LDLCALC, LDLDIRECT  WEIGHTS: Wt Readings from Last 3 Encounters:  11/26/19 170 lb 12.8 oz (77.5 kg)  09/30/19 168 lb (76.2 kg)  09/01/19 171 lb 3.2 oz (77.7 kg)    VITALS: BP 130/78    Pulse 81    Temp (!) 95.9 F (35.5 C)    Ht 5\' 6"  (1.676 m)    Wt 170 lb 12.8 oz (77.5 kg)    SpO2 97%    BMI 27.57 kg/m   EXAM: General appearance: alert and no distress Neck: JVD - several cm above sternal notch, no carotid bruit and thyroid not enlarged, symmetric, no tenderness/mass/nodules Lungs: diminished breath sounds bilaterally and rales RLL Heart: regular rate and rhythm, S1, S2 normal and systolic murmur: early systolic 2/6, blowing at apex Abdomen: soft, non-tender; bowel sounds normal; no masses,  no organomegaly Extremities: edema 1+ bilateral lower extremity edema Pulses: 2+ and symmetric Skin: Skin color, texture, turgor normal. No rashes or lesions Neurologic: Mental status: Alert, oriented, thought content appropriate, Hard of hearing Psych: Pleasant  EKG:  Sinus rhythm first-degree AV block at 81, nonspecific IVCD-personally reviewed  ASSESSMENT: 1. New onset A. fib with CVR -in sinus rhythm today 2. Acute on chronic systolic congestive heart failure, LVEF now less than 20% (08/2019) 3. Coronary artery disease status post CABG 4. Ischemic cardiomyopathy EF 35 to 40%, reduced to 30 to 35% (03/2018) 5. History of PSVT 6. Type 2 diabetes 7. Hypertension 8. Dyslipidemia 9. 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 CKD 3B/4 11. Establish with palliative care/DNR  PLAN: 1.  Troy Hicks has had new onset A. fib but is in sinus rhythm today.  His heart failure appears stable.  Creatinine is 2.1 which represents significant decline in renal function.  He is established with  palliative care and is sign DNR paperwork.  He is not actively on any comfort measures.  Overall he seems fairly stable today.  We will plan a 35-month follow-up.  He just celebrated his 89th birthday.  Pixie Casino, MD, [facility], [place] Director of the Advanced Lipid Disorders &  Cardiovascular Risk Reduction Clinic Diplomate of the American Board of Clinical Lipidology Attending Cardiologist  Direct Dial: [phone]   Fax: [phone]  Website:  [email or web address] 11/26/2019, 2:02 PM

## 2019-12-07 ENCOUNTER — Other Ambulatory Visit: Payer: Self-pay

## 2019-12-07 ENCOUNTER — Encounter: Payer: Self-pay | Admitting: Podiatry

## 2019-12-07 ENCOUNTER — Ambulatory Visit (INDEPENDENT_AMBULATORY_CARE_PROVIDER_SITE_OTHER): Payer: Medicare Other | Admitting: Podiatry

## 2019-12-07 DIAGNOSIS — S90221A Contusion of right lesser toe(s) with damage to nail, initial encounter: Secondary | ICD-10-CM | POA: Diagnosis not present

## 2019-12-07 DIAGNOSIS — M79674 Pain in right toe(s): Secondary | ICD-10-CM | POA: Diagnosis not present

## 2019-12-07 DIAGNOSIS — B351 Tinea unguium: Secondary | ICD-10-CM | POA: Diagnosis not present

## 2019-12-07 DIAGNOSIS — M79675 Pain in left toe(s): Secondary | ICD-10-CM

## 2019-12-07 DIAGNOSIS — Z794 Long term (current) use of insulin: Secondary | ICD-10-CM

## 2019-12-07 NOTE — Patient Instructions (Signed)
EPSOM SALT FOOT SOAK INSTRUCTIONS  *IF YOU HAVE BEEN PRESCRIBED ANTIBIOTICS, TAKE AS INSTRUCTED UNTIL ALL ARE GONE*  Shopping List:  A. Plain epsom salt (not scented) B. Neosporin Cream/Ointment or Bacitracin Cream/Ointment (or prescribed antiobiotic drops/cream/ointment) C. 1-inch fabric band-aids  1.  Place 1/4 cup of epsom salts in 2 quarts of warm tap water. IF YOU ARE DIABETIC, OR HAVE NEUROPATHY, CHECK THE TEMPERATURE OF THE WATER WITH YOUR ELBOW.  2.  Submerge your foot/feet in the solution and soak for 10-15 minutes.      3.  Next, remove your foot/feet from solution, blot dry the affected area.    4.  Apply light amount of antibiotic cream/ointment and cover with fabric band-aid .  5.  This soak should be done once every other day for 2 weeks.   6.  Monitor for any signs/symptoms of infection such as redness, swelling, odor, drainage, increased pain, or non-healing of digit.   7.  Please do not hesitate to call the office and speak to a Nurse or Doctor if you have questions.   8.  If you experience fever, chills, nightsweats, nausea or vomiting with worsening of digit, please go to the emergency room.

## 2019-12-10 ENCOUNTER — Emergency Department (HOSPITAL_COMMUNITY)
Admission: EM | Admit: 2019-12-10 | Discharge: 2019-12-11 | Disposition: A | Discharge status: Home / Self Care | Payer: Medicare Other | Attending: Emergency Medicine | Admitting: Emergency Medicine

## 2019-12-10 ENCOUNTER — Other Ambulatory Visit: Payer: Self-pay

## 2019-12-10 ENCOUNTER — Emergency Department (HOSPITAL_COMMUNITY): Payer: Medicare Other

## 2019-12-10 DIAGNOSIS — I129 Hypertensive chronic kidney disease with stage 1 through stage 4 chronic kidney disease, or unspecified chronic kidney disease: Secondary | ICD-10-CM | POA: Diagnosis not present

## 2019-12-10 DIAGNOSIS — I2510 Atherosclerotic heart disease of native coronary artery without angina pectoris: Secondary | ICD-10-CM | POA: Diagnosis not present

## 2019-12-10 DIAGNOSIS — W19XXXA Unspecified fall, initial encounter: Secondary | ICD-10-CM

## 2019-12-10 DIAGNOSIS — Z8673 Personal history of transient ischemic attack (TIA), and cerebral infarction without residual deficits: Secondary | ICD-10-CM | POA: Insufficient documentation

## 2019-12-10 DIAGNOSIS — E119 Type 2 diabetes mellitus without complications: Secondary | ICD-10-CM | POA: Insufficient documentation

## 2019-12-10 DIAGNOSIS — Z79899 Other long term (current) drug therapy: Secondary | ICD-10-CM | POA: Diagnosis not present

## 2019-12-10 DIAGNOSIS — Z794 Long term (current) use of insulin: Secondary | ICD-10-CM | POA: Insufficient documentation

## 2019-12-10 DIAGNOSIS — Z7982 Long term (current) use of aspirin: Secondary | ICD-10-CM | POA: Insufficient documentation

## 2019-12-10 DIAGNOSIS — W010XXA Fall on same level from slipping, tripping and stumbling without subsequent striking against object, initial encounter: Secondary | ICD-10-CM | POA: Insufficient documentation

## 2019-12-10 DIAGNOSIS — Z951 Presence of aortocoronary bypass graft: Secondary | ICD-10-CM | POA: Diagnosis not present

## 2019-12-10 DIAGNOSIS — S0990XA Unspecified injury of head, initial encounter: Secondary | ICD-10-CM

## 2019-12-10 DIAGNOSIS — N183 Chronic kidney disease, stage 3 unspecified: Secondary | ICD-10-CM

## 2019-12-10 DIAGNOSIS — Z87891 Personal history of nicotine dependence: Secondary | ICD-10-CM | POA: Insufficient documentation

## 2019-12-10 DIAGNOSIS — N1832 Chronic kidney disease, stage 3b: Secondary | ICD-10-CM | POA: Diagnosis not present

## 2019-12-10 LAB — I-STAT CHEM 8, ED
BUN: 46 mg/dL — ABNORMAL HIGH (ref 8–23)
Calcium, Ion: 1.13 mmol/L — ABNORMAL LOW (ref 1.15–1.40)
Chloride: 102 mmol/L (ref 98–111)
Creatinine, Ser: 2.6 mg/dL — ABNORMAL HIGH (ref 0.61–1.24)
Glucose, Bld: 226 mg/dL — ABNORMAL HIGH (ref 70–99)
HCT: 38 % — ABNORMAL LOW (ref 39.0–52.0)
Hemoglobin: 12.9 g/dL — ABNORMAL LOW (ref 13.0–17.0)
Potassium: 4.7 mmol/L (ref 3.5–5.1)
Sodium: 137 mmol/L (ref 135–145)
TCO2: 23 mmol/L (ref 22–32)

## 2019-12-10 NOTE — Progress Notes (Signed)
Subjective: Troy Hicks presents today at risk foot care. Pt has h/o NIDDM with chronic kidney disease and painful mycotic nails b/l that are difficult to trim. Pain interferes with ambulation. Aggravating factors include wearing enclosed shoe gear. Pain is relieved with periodic professional debridement.  He resides at [facility] in [place]. His daughter is present during today's visit.   Troy Hicks states he hit his right foot on a caster on his bed about 2 weeks ago. He states his right 3rd digit is painful. Toenail was damaged and is sticking up from the digit. He states toe bled. Digit is still tender when donning socks and shoes. Also tender to touch. He denies any pus from area, but digit is still sore.  [name], NP is patient's PCP.   Past Medical History:  Diagnosis Date   Chronic kidney disease (CKD), stage III (moderate)    Per [facility] Patient [facility]    Coronary artery disease    Per [facility] Patient Packet,[facility]    Edema    Per New Patient Packet,[facility]    Gait instability    Per New Patient Packet,[facility]    Gout    Per New Patient Packet,[facility]    Hypertension    Per New Patient Packet,[facility]    Insulin dependent diabetes mellitus    Per New Patient Packet,[facility]    Osteoarthritis    Per [facility] [place]    Syncope 2015   Per New Patient Packet,PSC    TIA (transient ischemic attack) 2010   Per New Patient Packet,[facility]      Current Outpatient Medications on File Prior to Visit  Medication Sig Dispense Refill   acetaminophen (TYLENOL) 500 MG tablet Take 500 mg by mouth 3 (three) times daily.      Alogliptin Benzoate 25 MG TABS Take 25 mg by mouth daily.      aspirin EC 81 MG tablet Take 81 mg by mouth daily.     colchicine 0.6 MG tablet Take 0.6 mg by mouth See admin instructions. Give 1 tablet at the first sign of gout, then give another tablet 2 hours later. Only give every 14 days due to kidney  function,     colestipol (COLESTID) 1 g tablet Take 1 g by mouth every other day.      Dextran 70-Hypromellose, PF, 0.1-0.3 % SOLN Apply 1 drop to eye every 4 (four) hours as needed (apply to both eyes).     furosemide (LASIX) 80 MG tablet Take 1 tablet (80 mg total) by mouth 2 (two) times daily. 180 tablet 3   Glycerin-Hypromellose-PEG 400 (DRY EYE RELIEF DROPS) 0.2-0.2-1 % SOLN Place 2 drops into both eyes 4 (four) times daily.     insulin glargine (LANTUS) 100 UNIT/ML injection Inject 18 Units into the skin at bedtime.      levothyroxine (SYNTHROID, LEVOTHROID) 100 MCG tablet Take 100 mcg by mouth daily before breakfast.     lipase/protease/amylase (CREON) 36000 UNITS CPEP capsule Take 36,000 Units by mouth 2 (two) times daily.      loperamide (IMODIUM) 2 MG capsule Take 2 mg by mouth as needed for diarrhea or loose stools. Do not exceed 6 doses in 24 hours.     losartan (COZAAR) 50 MG tablet Take 50 mg by mouth daily.      mirtazapine (REMERON) 7.5 MG tablet Take 7.5 mg by mouth once.     Multiple Vitamins-Minerals (PRESERVISION AREDS) CAPS Take 1 capsule by mouth  2 (two) times daily.     Potassium Chloride Crys ER (KLOR-CON M20 PO) Take 1 tablet by mouth daily.     sertraline (ZOLOFT) 25 MG tablet Take 25 mg by mouth daily.     traMADol (ULTRAM) 50 MG tablet Take 50 mg by mouth in the morning and at bedtime.      traZODone (DESYREL) 50 MG tablet Take 50 mg by mouth at bedtime.     No current facility-administered medications on file prior to visit.     Allergies  Allergen Reactions   Codeine     Objective: Troy Hicks is a pleasant 86 y.o.  Caucasian male, in NAD. AAO x 3.  There were no vitals filed for this visit.  Vascular Examination: Neurovascular status unchanged b/l. Capillary refill time to digits immediate b/l. Faintly palpable pedal pulses b/l. Pedal hair absent b/l Skin temperature gradient within normal limits b/l. +1 pitting edema b/l  LE.  Dermatological Examination: Pedal skin is thin shiny, atrophic b/l lower extremities. No open wounds bilaterally. No interdigital macerations bilaterally. Toenails 1-5 left, R hallux, R 2nd toe, R 4th toe and R 5th toe elongated, discolored, dystrophic, thickened, and crumbly with subungual debris and tenderness to dorsal palpation.   Right 3rd digit with tenderness to palpation. Nailplate is cracked transversely across nailbed with cracked nailplate in vertical direction. Digit also noted to have exposed nailbed with dried granular bed. No active drainage. No purulence. Mild edema and tender to touch.  Musculoskeletal: Normal muscle strength 5/5 to all lower extremity muscle groups bilaterally. Hallux valgus with bunion deformity noted b/l lower extremities. Painful right 3rd digit. He is still able to move the digit.  Neurological Examination: Protective sensation intact 5/5 intact bilaterally with 10g monofilament b/l. Vibratory sensation intact b/l. Clonus negative b/l.  Assessment: 1. Pain due to onychomycosis of toenails of both feet   2. Contusion of lesser toe of right foot with damage to nail, initial encounter   3. Type 2 diabetes mellitus with stage 3 chronic kidney disease, with long-term current use of insulin, unspecified whether stage 3a or 3b CKD ([place])     Plan: -Examined patient. -Gently debrided exposed nailplate from right 3rd digit. Exposed nailbed cleansed with alcohol. Triple antibiotic ointment and band-aid was applied. Daughter given instructions for epsom salt soaks once daily for two weeks. She will take AVS to facility so order will be placed for his treatment by facility staff. Call if condition does not improve. Explained to  -Offered xray of right foot. Daughter declines today and would like to treat digit with local wound care. If Mr. Bethel continues to have pain, she will reconsider xray.  -Continue diabetic foot care principles. Literature dispensed on  today.  -Toenails 1-5 left, right hallux, right 2nd, right 4th and right 5th digits were debrided in length and girth with sterile nail nippers and dremel without iatrogenic bleeding.  -Patient to continue soft, supportive shoe gear daily. -Patient to report any pedal injuries to medical professional immediately. -Patient/POA to call should there be question/concern in the interim.  Return in about 3 months (around 03/07/2020).  Marzetta Board, DPM

## 2019-12-10 NOTE — ED Triage Notes (Signed)
Pt. Arrived by EMS pt. is a A&O male with a history of dementia. Pt. Likes to take his med before his shower. Slipped and fell getting out the shower. Pt. Landed on back of his head. No LOC, and skin tears on the arms and earlobe. Patient has full range of motion of extremities. Daughter arrived before EMS left and stated her dad is at normal baseline with orientation.  Hx: afib ( not on blood thinners) , diabetes, and dementia  bp 107/60- heartrate 50-70, 98% RA  Cbg: 270  ( received 18  Units of lantus at nursing home)

## 2019-12-11 LAB — CBG MONITORING, ED: Glucose-Capillary: 207 mg/dL — ABNORMAL HIGH (ref 70–99)

## 2019-12-11 NOTE — ED Provider Notes (Signed)
[facility] DEPT Provider Note  CSN: [id number] Arrival date & time: 12/10/19 2237  Chief Complaint(s) Fall  HPI Troy Hicks is a 86 y.o. male who presents from a skilled nursing facility after fall from standing.  Patient slipped on wet floor after taking a shower.  Positive head trauma with out loss of consciousness.  Patient has a history of mild dementia but is alert and oriented x4.  He denies any physical complaints including headache, neck pain, back pain, chest pain, abdominal pain, hip pain or other extremity pain.    HPI  Past Medical History Past Medical History:  Diagnosis Date  . Chronic kidney disease (CKD), stage III (moderate)    Per New Patient Packet,PSC   . Coronary artery disease    Per New Patient Packet,[facility]   . Edema    Per New Patient Packet,PSC   . Gait instability    Per New Patient Packet,[facility]   . Gout    Per New Patient Packet,[facility]   . Hypertension    Per New Patient Packet,PSC   . Insulin dependent diabetes mellitus    Per New Patient Packet,PSC   . Osteoarthritis    Per New Patient Packet,PSC   . Syncope 2015   Per New Patient Packet,PSC   . TIA (transient ischemic attack) 2010   Per New Patient [place]    Patient Active Problem List   Diagnosis Date Noted  . Thrombocytopenia ([place]) 07/21/2019  . Perianal abscess 05/14/2019  . Diarrhea 05/14/2019  . 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 Osteoarthritis of glenohumeral joint 11/26/2018  . Pain in joint of left shoulder 11/25/2018  . Pain in joint of right shoulder 11/25/2018  . Pain in elbow 11/25/2018  . Shortness of breath 04/10/2018  . Ischemic cardiomyopathy 04/10/2018   Home Medication(s) Prior to Admission medications   Medication Sig Start Date End Date Taking? Authorizing Provider  acetaminophen (TYLENOL) 500 MG tablet Take 500 mg by mouth 3 (three) times daily.     [provider]  Alogliptin Benzoate 25 MG TABS Take 25 mg by mouth  daily.     [provider]  aspirin EC 81 MG tablet Take 81 mg by mouth daily.    [provider]  colchicine 0.6 MG tablet Take 0.6 mg by mouth See admin instructions. Give 1 tablet at the first sign of gout, then give another tablet 2 hours later. Only give every 14 days due to kidney function,    [provider]  colestipol (COLESTID) 1 g tablet Take 1 g by mouth every other day.     [provider]  Dextran 70-Hypromellose, PF, 0.1-0.3 % SOLN Apply 1 drop to eye every 4 (four) hours as needed (apply to both eyes).    [provider]  furosemide (LASIX) 80 MG tablet Take 1 tablet (80 mg total) by mouth 2 (two) times daily. 08/26/19   Hilty, Nadean Corwin, MD  Glycerin-Hypromellose-PEG 400 (DRY EYE RELIEF DROPS) 0.2-0.2-1 % SOLN Place 2 drops into both eyes 4 (four) times daily.    [provider]  insulin glargine (LANTUS) 100 UNIT/ML injection Inject 18 Units into the skin at bedtime.     [provider]  levothyroxine (SYNTHROID, LEVOTHROID) 100 MCG tablet Take 100 mcg by mouth daily before breakfast.    [provider]  lipase/protease/amylase (CREON) 36000 UNITS CPEP capsule Take 36,000 Units by mouth 2 (two) times daily.     [provider]  loperamide (IMODIUM) 2  MG capsule Take 2 mg by mouth as needed for diarrhea or loose stools. Do not exceed 6 doses in 24 hours.    [provider]  losartan (COZAAR) 50 MG tablet Take 50 mg by mouth daily.     [provider]  mirtazapine (REMERON) 7.5 MG tablet Take 7.5 mg by mouth once.    [provider]  Multiple Vitamins-Minerals (PRESERVISION AREDS) CAPS Take 1 capsule by mouth 2 (two) times daily.    [provider]  Potassium Chloride Crys ER (KLOR-CON M20 PO) Take 1 tablet by mouth daily.    [provider]  sertraline (ZOLOFT) 25 MG tablet Take 25 mg by mouth daily.    [provider]  traMADol (ULTRAM) 50 MG tablet  Take 50 mg by mouth in the morning and at bedtime.     [provider]  traZODone (DESYREL) 50 MG tablet Take 50 mg by mouth at bedtime.    [provider]                                                                                                                                    Past Surgical History Past Surgical History:  Procedure Laterality Date  . CORONARY ARTERY BYPASS GRAFT  1990   Per New Patient Packet,PSC   . HEMORRHOID SURGERY     over 25 to 40 years ago  . IVC FILTER INSERTION     Per New Patient Packet,PSC   . PILONIDAL CYST EXCISION  1944   Per New Patient [place]   . SKIN SURGERY  06/2018   Spot removed from head  . TONSILLECTOMY     Per New Patient Packet,PSC (childhood)    Family History Family History  Problem Relation Age of Onset  . Diabetes Mother   . Heart disease Mother   . Throat cancer Father   . Cirrhosis Son   . Alcoholism Son   . Ovarian cancer Daughter   . Depression Daughter   . Alcoholism Daughter   . Colon cancer Neg Hx     Social History Social History   Tobacco Use  . Smoking status: Former Smoker    Packs/day: 0.50    Years: 15.00    Pack years: 7.50    Quit date: 03/19/1957    Years since quitting: 62.7  . Smokeless tobacco: Never Used  . Tobacco comment: 60 years ago as of 2019   Vaping Use  . Vaping Use: Never used  Substance Use Topics  . Alcohol use: Yes    Comment: 1 drink per day  . Drug use: Not Currently   Allergies Codeine  Review of Systems Review of Systems All other systems are reviewed and are negative for acute change except as noted in the HPI   Physical Exam Vital Signs  I have reviewed the triage vital signs BP (!) 121/56 (BP Location: Left Arm)   Pulse  73   Temp 98 F (36.7 C) (Oral)   Resp 14   SpO2 96%   Physical Exam Constitutional:      General: He is not in acute distress.    Appearance: He is well-developed. He is not diaphoretic.  HENT:     Head:  Normocephalic.     Right Ear: External ear normal.     Left Ear: External ear normal.     Ears:   Eyes:     General: No scleral icterus.       Right eye: No discharge.        Left eye: No discharge.     Conjunctiva/sclera: Conjunctivae normal.     Pupils: Pupils are equal, round, and reactive to light.  Cardiovascular:     Rate and Rhythm: Regular rhythm.     Pulses:          Radial pulses are 2+ on the right side and 2+ on the left side.       Dorsalis pedis pulses are 2+ on the right side and 2+ on the left side.     Heart sounds: Normal heart sounds. No murmur heard.  No friction rub. No gallop.   Pulmonary:     Effort: Pulmonary effort is normal. No respiratory distress.     Breath sounds: Normal breath sounds. No stridor.  Abdominal:     General: There is no distension.     Palpations: Abdomen is soft.     Tenderness: There is no abdominal tenderness.  Musculoskeletal:       Hands:     Cervical back: Normal range of motion and neck supple. No bony tenderness.     Thoracic back: No bony tenderness.     Lumbar back: No bony tenderness.     Right lower leg: 1+ Pitting Edema present.     Left lower leg: 1+ Pitting Edema present.     Comments: Clavicle stable. Chest stable to AP/Lat compression. Pelvis stable to Lat compression. No obvious extremity deformity. No chest or abdominal wall contusion.  Skin:    General: Skin is warm.  Neurological:     Mental Status: He is alert and oriented to person, place, and time.     GCS: GCS eye subscore is 4. GCS verbal subscore is 5. GCS motor subscore is 6.     Comments: Moving all extremities      ED Results and Treatments Labs (all labs ordered are listed, but only abnormal results are displayed) Labs Reviewed  I-STAT CHEM 8, ED - Abnormal; Notable for the following components:      Result Value   BUN 46 (*)    Creatinine, Ser 2.60 (*)    Glucose, Bld 226 (*)    Calcium, Ion 1.13 (*)    Hemoglobin 12.9 (*)    HCT  38.0 (*)    All other components within normal limits  CBG MONITORING, ED  EKG  EKG Interpretation  Date/Time:  Thursday December 10 2019 22:47:26 EDT Ventricular Rate:  76 PR Interval:    QRS Duration: 158 QT Interval:  417 QTC Calculation: 469 R Axis:   -76 Text Interpretation: Sinus rhythm Prolonged PR interval Nonspecific IVCD with LAD Left ventricular hypertrophy Inferior infarct, old Anterior infarct, old Now in NSR compared to prior Otherwise no significant change Reconfirmed by Addison Lank [phone]) on 12/11/2019 12:17:04 AM      Radiology CT HEAD WO CONTRAST  Result Date: 12/11/2019 CLINICAL DATA:  Recent slip and fall with headaches, initial encounter EXAM: CT HEAD WITHOUT CONTRAST TECHNIQUE: Contiguous axial images were obtained from the base of the skull through the vertex without intravenous contrast. COMPARISON:  None. FINDINGS: Brain: Diffuse atrophic changes are identified commensurate with the patient's given age. No findings to suggest acute hemorrhage, acute infarction or space-occupying mass lesion is seen. Vascular: No hyperdense vessel or unexpected calcification. Skull: Normal. Negative for fracture or focal lesion. Sinuses/Orbits: No acute finding. Other: None. IMPRESSION: Chronic atrophic changes without acute abnormality. Electronically Signed   By: Inez Catalina M.D.   On: 12/11/2019 00:05    Pertinent labs & imaging results that were available during my care of the patient were reviewed by me and considered in my medical decision making (see chart for details).  Medications Ordered in ED Medications - No data to display                                                                                                                                  Procedures Procedures  (including critical care time)  Medical Decision Making / ED Course I  have reviewed the nursing notes for this encounter and the patient's prior records (if available in EHR or on provided paperwork).   Troy Hicks was evaluated in Emergency Department on 12/11/2019 for the symptoms described in the history of present illness. He was evaluated in the context of the global COVID-19 pandemic, which necessitated consideration that the patient might be at risk for infection with the SARS-CoV-2 virus that causes COVID-19. Institutional protocols and algorithms that pertain to the evaluation of patients at risk for COVID-19 are in a state of rapid change based on information released by regulatory bodies including the CDC and federal and state organizations. These policies and algorithms were followed during the patient's care in the ED.  Mechanical fall from standing with minor head trauma without loss of consciousness. CT head negative. Labs with mildly worse CKD stage III. Daughter called and informed of findings.       Final Clinical Impression(s) / ED Diagnoses Final diagnoses:  Fall, initial encounter  Minor head injury, initial encounter  Stage 3 chronic kidney disease, unspecified whether stage 3a or 3b CKD    The patient appears reasonably screened and/or stabilized for discharge and I doubt any other medical condition or other [facility] requiring further screening, evaluation, or treatment  in the ED at this time prior to discharge. Safe for discharge with strict return precautions.  Disposition: Discharge  Condition: Good  I have discussed the results, Dx and Tx plan with the patient/family who expressed understanding and agree(s) with the plan. Discharge instructions discussed at length. The patient/family was given strict return precautions who verbalized understanding of the instructions. No further questions at time of discharge.    ED Discharge Orders    None        Follow Up: [name], NP [address]  [place] [address] [phone]  Call  As needed    This chart was dictated using voice recognition software.  Despite best efforts to proofread,  errors can occur which can change the documentation meaning.   Fatima Blank, MD 12/11/19 [phone]

## 2019-12-11 NOTE — ED Notes (Signed)
Report called and given to nurse at [facility]

## 2019-12-22 ENCOUNTER — Encounter (HOSPITAL_COMMUNITY): Payer: Self-pay

## 2019-12-22 ENCOUNTER — Other Ambulatory Visit: Payer: Self-pay

## 2019-12-22 ENCOUNTER — Emergency Department (HOSPITAL_COMMUNITY)
Admission: EM | Admit: 2019-12-22 | Discharge: 2019-12-22 | Disposition: A | Discharge status: Home / Self Care | Payer: Medicare Other | Attending: Emergency Medicine | Admitting: Emergency Medicine

## 2019-12-22 ENCOUNTER — Emergency Department (HOSPITAL_COMMUNITY): Payer: Medicare Other

## 2019-12-22 DIAGNOSIS — S50811A Abrasion of right forearm, initial encounter: Secondary | ICD-10-CM | POA: Diagnosis present

## 2019-12-22 DIAGNOSIS — Z5321 Procedure and treatment not carried out due to patient leaving prior to being seen by health care provider: Secondary | ICD-10-CM | POA: Diagnosis not present

## 2019-12-22 DIAGNOSIS — Y92002 Bathroom of unspecified non-institutional (private) residence single-family (private) house as the place of occurrence of the external cause: Secondary | ICD-10-CM | POA: Insufficient documentation

## 2019-12-22 DIAGNOSIS — Z7982 Long term (current) use of aspirin: Secondary | ICD-10-CM | POA: Insufficient documentation

## 2019-12-22 DIAGNOSIS — W010XXA Fall on same level from slipping, tripping and stumbling without subsequent striking against object, initial encounter: Secondary | ICD-10-CM | POA: Diagnosis not present

## 2019-12-22 NOTE — ED Triage Notes (Signed)
Patient states he has a leaking toilet at home and slipped on the wet bathroom floor. Patient states he does not know if he hit his head or not. Patient states that he has a scrape on the right forearm. Patient takes a daily Aspirin

## 2020-01-21 ENCOUNTER — Non-Acute Institutional Stay: Payer: Medicare Other | Admitting: Nurse Practitioner

## 2020-01-21 ENCOUNTER — Other Ambulatory Visit: Payer: Self-pay

## 2020-01-21 NOTE — Progress Notes (Deleted)
[facility] Consult Note Telephone: [phone]  Fax: [phone]  PATIENT NAME: Troy Hicks [place] [address] [phone] (home)  DOB: 1920/03/05 MRN: [id number]  PRIMARY CARE PROVIDER:    [name], NP,  [address] [place] [address] [phone]  REFERRING PROVIDER:   [name], NP [address] [place],  [place] [address] [phone]  RESPONSIBLE PARTY:   Extended Emergency Contact Information Primary Emergency Contact: [place], [facility] Address: [address]          [place], [place] [address] Home Phone: [phone] Relation: Daughter  I met face to face with patient and family in facility.    ASSESSMENT AND RECOMMENDATIONS:   Goal of care: Directives:  2. Symptom Management:   3. Follow up Palliative Care Visit: Palliative care will continue to follow for goals of care clarification and symptom management. Return *** weeks or prn.  4. Family /Caregiver/Community Supports:   5. Cognitive / Functional decline:   I spent *** minutes providing this consultation, time includes time spent with patient/family, chart review, provider coordination, and documentation. More than 50% of the time in this consultation was spent coordinating communication.   CHIEF COMPLAINT:  HISTORY OF PRESENT ILLNESS:  [name] is a 86 y.o. year old male with multiple medical problems including ***. Palliative Care was asked to follow this patient by consultation request of [name]* to help address advance care planning and goals of care. Palliative Care was asked to help address goals of care. This is a *** visit.  CODE STATUS:   PPS: ***0%  HOSPICE ELIGIBILITY/DIAGNOSIS: TBD  PAST MEDICAL HISTORY:  Past Medical History:  Diagnosis Date  . Chronic kidney disease (CKD), stage III (moderate) ([place])    Per New Patient Packet,PSC   .  Coronary artery disease    Per New Patient Packet,PSC   . Edema    Per New Patient Packet,PSC   . Gait instability    Per New Patient Packet,PSC   . Gout    Per New Patient Packet,PSC   . Hypertension    Per New Patient Packet,PSC   . Insulin dependent diabetes mellitus    Per New Patient Packet,PSC   . Osteoarthritis    Per New Patient Packet,PSC   . Syncope 2015   Per New Patient Packet,PSC   . TIA (transient ischemic attack) 2010   Per New Patient Packet,PSC     SOCIAL HX:  Social History   Tobacco Use  . Smoking status: Former Smoker    Packs/day: 0.50    Years: 15.00    Pack years: 7.50    Quit date: 03/19/1957    Years since quitting: 62.8  . Smokeless tobacco: Never Used  . Tobacco comment: 60 years ago as of 2019   Substance Use Topics  . Alcohol use: Yes    Comment: 1 drink per day   FAMILY HX:  Family History  Problem Relation Age of Onset  . Diabetes Mother   . Heart disease Mother   . Throat cancer Father   . Cirrhosis Son   . Alcoholism Son   . Ovarian cancer Daughter   . Depression Daughter   . Alcoholism Daughter   . Colon cancer Neg Hx     ALLERGIES:  Allergies  Allergen Reactions  . Codeine      PERTINENT MEDICATIONS:  Outpatient Encounter Medications as of 01/21/2020  Medication Sig  .  acetaminophen (TYLENOL) 500 MG tablet Take 500 mg by mouth 3 (three) times daily.   . Alogliptin Benzoate 25 MG TABS Take 25 mg by mouth daily.   Marland Kitchen aspirin EC 81 MG tablet Take 81 mg by mouth daily.  . colchicine 0.6 MG tablet Take 0.6 mg by mouth See admin instructions. Give 1 tablet at the first sign of gout, then give another tablet 2 hours later. Only give every 14 days due to kidney function,  . colestipol (COLESTID) 1 g tablet Take 1 g by mouth every other day.   Marland Kitchen Dextran 70-Hypromellose, PF, 0.1-0.3 % SOLN Apply 1 drop to eye every 4 (four) hours as needed (apply to both eyes).  . furosemide (LASIX) 80 MG tablet Take 1 tablet (80 mg total) by mouth 2  (two) times daily.  . Glycerin-Hypromellose-PEG 400 (DRY EYE RELIEF DROPS) 0.2-0.2-1 % SOLN Place 2 drops into both eyes 4 (four) times daily.  . insulin glargine (LANTUS) 100 UNIT/ML injection Inject 18 Units into the skin at bedtime.   Marland Kitchen levothyroxine (SYNTHROID, LEVOTHROID) 100 MCG tablet Take 100 mcg by mouth daily before breakfast.  . lipase/protease/amylase (CREON) 36000 UNITS CPEP capsule Take 36,000 Units by mouth 2 (two) times daily.   Marland Kitchen loperamide (IMODIUM) 2 MG capsule Take 2 mg by mouth as needed for diarrhea or loose stools. Do not exceed 6 doses in 24 hours.  Marland Kitchen losartan (COZAAR) 50 MG tablet Take 50 mg by mouth daily.   . mirtazapine (REMERON) 7.5 MG tablet Take 7.5 mg by mouth once.  . Multiple Vitamins-Minerals (PRESERVISION AREDS) CAPS Take 1 capsule by mouth 2 (two) times daily.  . Potassium Chloride Crys ER (KLOR-CON M20 PO) Take 1 tablet by mouth daily.  . sertraline (ZOLOFT) 25 MG tablet Take 25 mg by mouth daily.  . traMADol (ULTRAM) 50 MG tablet Take 50 mg by mouth in the morning and at bedtime.   . traZODone (DESYREL) 50 MG tablet Take 50 mg by mouth at bedtime.   No facility-administered encounter medications on file as of 01/21/2020.    PHYSICAL EXAM / ROS:   Current and past weights: *** General: NAD, frail appearing, thin Cardiovascular: no chest pain reported, no edema  Pulmonary: no cough, no increased SOB, room air GI: Swallowing ***, appetite fair, endorses constipation, incontinent of bowel GU: denies dysuria, incontinent of urine MSK:  no joint and ROM abnormalities, ambulatory Skin: no rashes or wounds reported Neurological: Weakness, but otherwise nonfocal  Jari Favre, DNP, AGPCNP-BC

## 2020-01-28 ENCOUNTER — Non-Acute Institutional Stay: Payer: Medicare Other | Admitting: Nurse Practitioner

## 2020-01-28 ENCOUNTER — Other Ambulatory Visit: Payer: Self-pay

## 2020-01-28 DIAGNOSIS — Z515 Encounter for palliative care: Secondary | ICD-10-CM

## 2020-01-28 DIAGNOSIS — I509 Heart failure, unspecified: Secondary | ICD-10-CM

## 2020-01-28 NOTE — Progress Notes (Addendum)
[facility] Consult Note Telephone: [phone]  Fax: [phone]  PATIENT NAME: Troy Hicks [place] [address] [phone] (home)  DOB: 05/16/1919 MRN: [id number]  PRIMARY CARE PROVIDER:    [name], NP,  [address] [place] [address] [phone]  REFERRING PROVIDER:   [name], NP [address] [place],  [place] [address] [phone]  RESPONSIBLE PARTY:   Extended Emergency Contact Information Primary Emergency Contact: Troy Hicks, [facility] Address: [address]          [place], [place] [address] Home Phone: [phone] Relation: Daughter  I met face to face with patient and daughter Troy Hicks in facility.   ASSESSMENT AND RECOMMENDATIONS:   Advance Care Planning: Goal of care: Patient's goal of care is function. Family want patient to breath adequately, and have access to supplemental oxygen when and if he needs it.  Directives: Patient does not want to be resuscitated in the event of cardiac or respiratory arrest. Daughter expressed awareness of patient's frailty and possibility of a negative outcome with CPR/resusciation attempt. DNR form on chart in facility, copy uploaded to [facility] EMR. Discussed MOST form, gave opportunity for questions, all questions answered. Daughter want to discuss the sections with patient before completing form. Blank MOST form left with patient and daughter, education material on MOST left with daughter. Plan to complete and sign form when family is ready.  Symptom Management: Patient with HF, EF <20% per Echocardiogram completed 6 months ago, patient is followed by cardiology. Dyspnea: Patient and family expressed concern for patient's increasing dyspnea that is worse in the last week, episodes occurring almost daily. Patient report recent dyspnea has no relation to activity, patient does not know what brings  it on, reporting that it comes in spells. Patient report having feeling of inability to breath even while just sitting down. Patient report having anxiety and panics when symptoms occur. He report stopping and taking slow deep breaths often gets it under control. Daughter report oxygen saturation has been mostly stable, lowest saturation noted is 90%. Daughter expressed concern that facility is not checking oxygen saturation as often as she wants it checked, saying facility said they are not equipped to do such. Daughter is arranging for PT to work with patient twice a week and also would monitor his oxygen saturation. Patient denied cough, denied orthopnea, denied increased lower extremity swelling, denied palpitations, denied dizziness or lightheadedness. Patient on Zoloft 67m and Trazodone 547mfor depression and sleep. Recommendation: Recommend daughter get a portable pulse oximeter for patient. Patient practiced reading numbers off pulse an oximeter today, patient to monitor his oxygen saturation when he has the feeling of dyspnea, will call daughter with readings. I will reach out to patient[facility] Patient may need imaging to rule out other cause of dyspnea, I am especially concerned about dyspnea at rest. Advised patient to pace himself during activities, to ask for assistance from staff is needed. Reviewed purse lip deep breathing techniques. Family report history of anxiety, patient may need to be started on an anti-anxiety medication such as Troy Hicks. I recommend stopping Zoloft and starting patient on Troy Hicks.  Follow up Palliative Care Visit: Palliative care will continue to follow for goals of care clarification and symptom management. Return in about 4 weeks or prn.  Family /Caregiver/Community Supports: [facility] a [facility]. He is  an [employer], participated in [employer] day activity in the  facility today. His daughter Troy Hicks is very involved in his care.  5. Cognitive / Functional decline: Patient awake and alert, he is coherent, however hard of hearing uses hearing aids. Patient completes his ADLs independently. He ambulates with a walker.  I spent 80 minutes providing this consultation, time includes time spent with patient and daughter, chart review, and documentation. More than 50% of the time in this consultation was spent coordinating communication.   HISTORY OF PRESENT ILLNESS:  Troy Hicks is a 86 y.o.  male with h/o CAD (CABG), ischemic cardiomyopathy (EF by echo 08/25/19: <20%), acute on chronic systolic CHF, advanced chronic kidney disease stabe IIIb-IV GFR 33, PSVT, diabetes, HTN, dyslipideia, lower extremity venous insufficiency, mitral/valvular heart disease, DVT (IVC filter), gout, osteoarthritis, hx of TIAs, mild thrombocytopenia (Dr. Lindi Hicks; observation), Type 2 diabetes on Lantus 25 units QHS and Alogliptin Benzoate 63m). This is a follow up visit from 09/01/2019.  CODE STATUS: DNR  PPS: 50%  HOSPICE ELIGIBILITY/DIAGNOSIS: TBD  PHYSICAL EXAM / ROS:   Current and past weights: Stable at 171lbs, no report of weight loss. Ht 577f", BMI 27.6kg/m2 General:  frail appearing, cooperative, sitting in a chair in his room actively participating in discussion. Cardiovascular: denied chest pain, denied palpitation, trace edema  Pulmonary: no cough, no increased SOB, no wheezing, oxygen saturation 94% on room air, HR 87 GI: no swallowing issues, appetite fair, denied constipation, continent of bowel GU: denies dysuria, continent of urine MSK:  no joint and ROM abnormalities, ambulatory Skin: no rashes or wounds reported or noted on exposed skin Neurological: Weakness, but otherwise nonfocal  PAST MEDICAL HISTORY:  Past Medical History:  Diagnosis Date  . Chronic kidney disease (CKD), stage III (moderate) ([place]   Per New Patient Packet,PSC   . Coronary artery  disease    Per New Patient Packet,PSC   . Edema    Per New Patient Packet,PSC   . Gait instability    Per New Patient Packet,PSC   . Gout    Per New Patient Packet,PSC   . Hypertension    Per New Patient Packet,PSC   . Insulin dependent diabetes mellitus    Per New Patient Packet,PSC   . Osteoarthritis    Per New Patient Packet,PSC   . Syncope 2015   Per New Patient Packet,PSC   . TIA (transient ischemic attack) 2010   Per New Patient Packet,PSC     SOCIAL HX:  Social History   Tobacco Use  . Smoking status: Former Smoker    Packs/day: 0.50    Years: 15.00    Pack years: 7.50    Quit date: 03/19/1957    Years since quitting: 62.9  . Smokeless tobacco: Never Used  . Tobacco comment: 60 years ago as of 2019   Substance Use Topics  . Alcohol use: Yes    Comment: 1 drink per day   FAMILY HX:  Family History  Problem Relation Age of Onset  . Diabetes Mother   . Heart disease Mother   . Throat cancer Father   . Cirrhosis Son   . Alcoholism Son   . Ovarian cancer Daughter   . Depression Daughter   . Alcoholism Daughter   . Colon cancer Neg Hx     ALLERGIES:  Allergies  Allergen Reactions  . Codeine      PERTINENT MEDICATIONS:  Outpatient Encounter Medications as of 01/28/2020  Medication Sig  . acetaminophen (  TYLENOL) 500 MG tablet Take 500 mg by mouth 3 (three) times daily.   . Alogliptin Benzoate 25 MG TABS Take 25 mg by mouth daily.   Marland Kitchen aspirin EC 81 MG tablet Take 81 mg by mouth daily.  . colchicine 0.6 MG tablet Take 0.6 mg by mouth See admin instructions. Give 1 tablet at the first sign of gout, then give another tablet 2 hours later. Only give every 14 days due to kidney function,  . colestipol (COLESTID) 1 g tablet Take 1 g by mouth every other day.   Marland Kitchen Dextran 70-Hypromellose, PF, 0.1-0.3 % SOLN Apply 1 drop to eye every 4 (four) hours as needed (apply to both eyes).  . furosemide (LASIX) 80 MG tablet Take 1 tablet (80 mg total) by mouth 2 (two) times  daily.  . Glycerin-Hypromellose-PEG 400 (DRY EYE RELIEF DROPS) 0.2-0.2-1 % SOLN Place 2 drops into both eyes 4 (four) times daily.  . insulin glargine (LANTUS) 100 UNIT/ML injection Inject 18 Units into the skin at bedtime.   Marland Kitchen levothyroxine (SYNTHROID, LEVOTHROID) 100 MCG tablet Take 100 mcg by mouth daily before breakfast.  . lipase/protease/amylase (CREON) 36000 UNITS CPEP capsule Take 36,000 Units by mouth 2 (two) times daily.   Marland Kitchen loperamide (IMODIUM) 2 MG capsule Take 2 mg by mouth as needed for diarrhea or loose stools. Do not exceed 6 doses in 24 hours.  Marland Kitchen losartan (COZAAR) 50 MG tablet Take 50 mg by mouth daily.   . mirtazapine (REMERON) 7.5 MG tablet Take 7.5 mg by mouth once.  . Multiple Vitamins-Minerals (PRESERVISION AREDS) CAPS Take 1 capsule by mouth 2 (two) times daily.  . Potassium Chloride Crys ER (KLOR-CON M20 PO) Take 1 tablet by mouth daily.  . sertraline (ZOLOFT) 25 MG tablet Take 25 mg by mouth daily.  . traMADol (ULTRAM) 50 MG tablet Take 50 mg by mouth in the morning and at bedtime.   . traZODone (DESYREL) 50 MG tablet Take 50 mg by mouth at bedtime.   No facility-administered encounter medications on file as of 01/28/2020.     Jari Favre, DNP, AGPCNP-BC

## 2020-03-08 ENCOUNTER — Ambulatory Visit (INDEPENDENT_AMBULATORY_CARE_PROVIDER_SITE_OTHER): Payer: Medicare Other | Admitting: Podiatry

## 2020-03-08 ENCOUNTER — Other Ambulatory Visit: Payer: Self-pay

## 2020-03-08 ENCOUNTER — Encounter: Payer: Self-pay | Admitting: Podiatry

## 2020-03-08 DIAGNOSIS — M79675 Pain in left toe(s): Secondary | ICD-10-CM

## 2020-03-08 DIAGNOSIS — Z794 Long term (current) use of insulin: Secondary | ICD-10-CM

## 2020-03-08 DIAGNOSIS — N183 Chronic kidney disease, stage 3 unspecified: Secondary | ICD-10-CM | POA: Diagnosis not present

## 2020-03-08 DIAGNOSIS — E1122 Type 2 diabetes mellitus with diabetic chronic kidney disease: Secondary | ICD-10-CM | POA: Diagnosis not present

## 2020-03-08 DIAGNOSIS — B351 Tinea unguium: Secondary | ICD-10-CM | POA: Diagnosis not present

## 2020-03-08 DIAGNOSIS — M79674 Pain in right toe(s): Secondary | ICD-10-CM

## 2020-03-14 NOTE — Progress Notes (Signed)
Subjective: Troy Hicks presents today at risk foot care. Pt has h/o NIDDM with chronic kidney disease and painful mycotic nails b/l that are difficult to trim. Pain interferes with ambulation. Aggravating factors include wearing enclosed shoe gear. Pain is relieved with periodic professional debridement.  He resides at [facility] in [place]. His daughter is present during today's visit.   He voices no new pedal concerns on today's visit.  Troy Sessions, NP is patient's PCP. Last visit was 03/01/2020.  Past Medical History:  Diagnosis Date  . Chronic kidney disease (CKD), stage III (moderate) ([place])    Per New Patient Packet,PSC   . Coronary artery disease    Per New Patient Packet,PSC   . Edema    Per New Patient Packet,PSC   . Gait instability    Per New Patient Packet,PSC   . Gout    Per New Patient Packet,PSC   . Hypertension    Per New Patient Packet,PSC   . Insulin dependent diabetes mellitus    Per New Patient Packet,PSC   . Osteoarthritis    Per New Patient Packet,PSC   . Syncope 2015   Per New Patient Packet,PSC   . TIA (transient ischemic attack) 2010   Per New Patient Packet,PSC      Current Outpatient Medications on File Prior to Visit  Medication Sig Dispense Refill  . acetaminophen (TYLENOL) 500 MG tablet Take 500 mg by mouth 3 (three) times daily.     . Alogliptin Benzoate 25 MG TABS Take 25 mg by mouth daily.     Marland Kitchen aspirin EC 81 MG tablet Take 81 mg by mouth daily.    . colchicine 0.6 MG tablet Take 0.6 mg by mouth See admin instructions. Give 1 tablet at the first sign of gout, then give another tablet 2 hours later. Only give every 14 days due to kidney function,    . colestipol (COLESTID) 1 g tablet Take 1 g by mouth every other day.     Marland Kitchen Dextran 70-Hypromellose, PF, 0.1-0.3 % SOLN Apply 1 drop to eye every 4 (four) hours as needed (apply to both eyes).    . furosemide (LASIX) 80 MG tablet Take 1  tablet (80 mg total) by mouth 2 (two) times daily. 180 tablet 3  . Glycerin-Hypromellose-PEG 400 (DRY EYE RELIEF DROPS) 0.2-0.2-1 % SOLN Place 2 drops into both eyes 4 (four) times daily.    . insulin glargine (LANTUS) 100 UNIT/ML injection Inject 18 Units into the skin at bedtime.     Marland Kitchen levothyroxine (SYNTHROID, LEVOTHROID) 100 MCG tablet Take 100 mcg by mouth daily before breakfast.    . lipase/protease/amylase (CREON) 36000 UNITS CPEP capsule Take 36,000 Units by mouth 2 (two) times daily.     Marland Kitchen loperamide (IMODIUM) 2 MG capsule Take 2 mg by mouth as needed for diarrhea or loose stools. Do not exceed 6 doses in 24 hours.    Marland Kitchen losartan (COZAAR) 50 MG tablet Take 50 mg by mouth daily.     . mirtazapine (REMERON) 7.5 MG tablet Take 7.5 mg by mouth once.    . Multiple Vitamins-Minerals (PRESERVISION AREDS) CAPS Take 1 capsule by mouth 2 (two) times daily.    . Potassium Chloride Crys ER (KLOR-CON M20 PO) Take 1 tablet by mouth daily.    . sertraline (ZOLOFT) 25 MG tablet Take 25 mg by mouth daily.    . traMADol (ULTRAM) 50 MG tablet Take 50 mg by mouth in  the morning and at bedtime.     . traZODone (DESYREL) 50 MG tablet Take 50 mg by mouth at bedtime.     No current facility-administered medications on file prior to visit.     Allergies  Allergen Reactions  . Codeine     Objective: Troy Hicks is a pleasant 86 y.o.  Caucasian male, in NAD. AAO x 3.  There were no vitals filed for this visit.  Vascular Examination: Neurovascular status unchanged b/l. Capillary refill time to digits immediate b/l. Faintly palpable pedal pulses b/l. Pedal hair absent b/l Skin temperature gradient within normal limits b/l. +1 pitting edema b/l LE.  Dermatological Examination: Pedal skin is thin shiny, atrophic b/l lower extremities. No open wounds bilaterally. No interdigital macerations bilaterally. Toenails 1-5 left, R hallux, R 2nd toe, R 4th toe and R 5th toe elongated, discolored, dystrophic,  thickened, and crumbly with subungual debris and tenderness to dorsal palpation.   Anonychia right 3rd digit. Nailbed intact.  Musculoskeletal: Normal muscle strength 5/5 to all lower extremity muscle groups bilaterally. Hallux valgus with bunion deformity noted b/l lower extremities. Painful right 3rd digit. He is still able to move the digit.  Neurological Examination: Protective sensation intact 5/5 intact bilaterally with 10g monofilament b/l. Vibratory sensation intact b/l. Clonus negative b/l.  Assessment: 1. Pain due to onychomycosis of toenails of both feet   2. Type 2 diabetes mellitus with stage 3 chronic kidney disease, with long-term current use of insulin, unspecified whether stage 3a or 3b CKD ([place])     Plan: -Examined patient. -Continue diabetic foot care principles. -Toenails 1-5 left, right hallux, right 2nd, right 4th and right 5th digits were debrided in length and girth with sterile nail nippers and dremel without iatrogenic bleeding.  -Patient to continue soft, supportive shoe gear daily. -Patient to report any pedal injuries to medical professional immediately. -Patient/POA to call should there be question/concern in the interim.  Return in about 3 months (around 06/06/2020).  Marzetta Board, DPM

## 2020-03-31 ENCOUNTER — Non-Acute Institutional Stay: Payer: Medicare Other | Admitting: Nurse Practitioner

## 2020-03-31 ENCOUNTER — Other Ambulatory Visit: Payer: Self-pay

## 2020-03-31 DIAGNOSIS — Z515 Encounter for palliative care: Secondary | ICD-10-CM

## 2020-03-31 DIAGNOSIS — G894 Chronic pain syndrome: Secondary | ICD-10-CM

## 2020-03-31 NOTE — Progress Notes (Signed)
Troy Hicks Consult Note Telephone: [phone]  Fax: [phone]  PATIENT NAME: Troy Hicks [place] [address] [phone] (home)  DOB: 1919-05-03 MRN: [id number]  PRIMARY CARE PROVIDER:    [name], NP,  [address] [place] [address] [phone]  REFERRING PROVIDER:   [name], NP [address] [place],  [place] [address] [phone]  RESPONSIBLE PARTY:   Extended Emergency Contact Information Primary Emergency Contact: Troy Hicks, [facility] Address: [address]          [place], [place] [address] Home Phone: [phone] Relation: Daughter  I met face to face with patient in facility.   ASSESSMENT AND RECOMMENDATIONS:   Advance Care Planning: Goal of care: Patient's goal of care is function. Patient and her daughter desires for patient to maintain as much function as possible. Directives: Signed DNR form on file in the facility and on [facility] EMR. Family not ready to complete MOST form at this time.  Symptom Management:  Chronic back pain: Patient with chronic back pain related to OA. Current pain regimen include Tramadol 19m by mouth twice a day and Tylenol 5026mthree times a day. Patient report a 7/10 pain during visit today. Last pain med administration was about 2hrs ago (received Tramadol 5058mnd Tyenol 500m3mSpoke with daughter on phone, she said [facility], was given a back brace and recommendation was made for accupuction and Chiropractor, awaiting visit. Patient report moderate relief with application of back brace, no report of acute decline in function. Recommendation: Consider increasing Tylenol from 500mg59m to 1000mg 72m Discussed recommendation with Facility provider Troy Hicks or the increased dose.  Follow up Palliative Care Visit:  Palliative care will continue to follow for goals of care clarification and symptom management. Return in 4-8 weeks or prn.  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  Cognitive / Functional decline: Patient awake and alert, very hard of hearing not compliant with using hearing aids. Patient completes his ADLs independently. He ambulates with a walker.  I spent 40 minutes providing this consultation, time includes time spent with patient and with daughter on phone, chart review, provider coordination, and documentation. More than 50% of the time in this consultation was spent counseling and coordinating communication.   CHIEF COMPLAINT: Back pain  HISTORY OF PRESENT ILLNESS:Troy Welfareis a 89 y.o34malewith h/o CAD (CABG), ischemic cardiomyopathy (EF by echo 08/25/19: <20%), acute on chronic systolic CHF, advanced chronic kidney disease stabe IIIb-IV GFR 33, PSVT, diabetes, HTN, dyslipideia, lower extremity venous insufficiency, mitral/valvular heart disease, DVT (IVC filter), gout, osteoarthritis, hx of TIAs, mild thrombocytopenia (Troy Hicks), Type 2 diabetes on Lantus 25 units QHS and Alogliptin Benzoate 25mg).56ms is a follow up visit from 01/28/2020.  CODE STATUS: DNR  PPS: 50%  HOSPICE ELIGIBILITY/DIAGNOSIS: TBD  PHYSICAL EXAM / ROS:   Current and past weights: stable at 174lbs, ht 24f 6", 16f 28.1kg/m2 General:  frail appearing, cooperative, noted walking to room from lunch room. Cardiovascular: denied chest pain, denied palpitation, no edema  Pulmonary: no cough, no increased SOB, no wheezing, oxygen saturation 95% on room air, HR 86 GI: no report of swallowing issues, appetite fair, denied constipation, continent of bowel GU: denies dysuria, continent of urine MSK: chronic back pain, ambulatory Skin:  no rashes or wounds reported or noted on exposed skin Neurological: Weakness,  but otherwise nonfocal Psych: non -anxious affect  PAST MEDICAL HISTORY:  Past Medical History:  Diagnosis Date  . Chronic kidney disease (CKD), stage III (moderate) ([place])    Per New Patient Packet,PSC   . Coronary artery disease    Per New Patient Packet,PSC   . Edema    Per New Patient Packet,PSC   . Gait instability    Per New Patient Packet,PSC   . Gout    Per New Patient Packet,PSC   . Hypertension    Per New Patient Packet,PSC   . Insulin dependent diabetes mellitus    Per New Patient Packet,PSC   . Osteoarthritis    Per New Patient Packet,PSC   . Syncope 2015   Per New Patient Packet,PSC   . TIA (transient ischemic attack) 2010   Per New Patient Packet,PSC     SOCIAL HX:  Social History   Tobacco Use  . Smoking status: Former Smoker    Packs/day: 0.50    Years: 15.00    Pack years: 7.50    Quit date: 03/19/1957    Years since quitting: 63.0  . Smokeless tobacco: Never Used  . Tobacco comment: 60 years ago as of 2019   Substance Use Topics  . Alcohol use: Yes    Comment: 1 drink per day   FAMILY HX:  Family History  Problem Relation Age of Onset  . Diabetes Mother   . Heart disease Mother   . Throat cancer Father   . Cirrhosis Son   . Alcoholism Son   . Ovarian cancer Daughter   . Depression Daughter   . Alcoholism Daughter   . Colon cancer Neg Hx     ALLERGIES:  Allergies  Allergen Reactions  . Codeine      PERTINENT MEDICATIONS:  Outpatient Encounter Medications as of 03/31/2020  Medication Sig  . acetaminophen (TYLENOL) 500 MG tablet Take 500 mg by mouth 3 (three) times daily.   . Alogliptin Benzoate 25 MG TABS Take 25 mg by mouth daily.   Marland Kitchen aspirin EC 81 MG tablet Take 81 mg by mouth daily.  . colchicine 0.6 MG tablet Take 0.6 mg by mouth See admin instructions. Give 1 tablet at the first sign of gout, then give another tablet 2 hours later. Only give every 14 days due to kidney function,  . colestipol (COLESTID) 1 g tablet Take 1 g by mouth  every other day.   Marland Kitchen Dextran 70-Hypromellose, PF, 0.1-0.3 % SOLN Apply 1 drop to eye every 4 (four) hours as needed (apply to both eyes).  . furosemide (LASIX) 80 MG tablet Take 1 tablet (80 mg total) by mouth 2 (two) times daily.  . Glycerin-Hypromellose-PEG 400 (DRY EYE RELIEF DROPS) 0.2-0.2-1 % SOLN Place 2 drops into both eyes 4 (four) times daily.  . insulin glargine (LANTUS) 100 UNIT/ML injection Inject 18 Units into the skin at bedtime.   Marland Kitchen levothyroxine (SYNTHROID, LEVOTHROID) 100 MCG tablet Take 100 mcg by mouth daily before breakfast.  . lipase/protease/amylase (CREON) 36000 UNITS CPEP capsule Take 36,000 Units by mouth 2 (two) times daily.   Marland Kitchen loperamide (IMODIUM) 2 MG capsule Take 2 mg by mouth as needed for diarrhea or loose stools. Do not exceed 6 doses in 24 hours.  Marland Kitchen losartan (COZAAR) 50 MG tablet Take 50 mg by mouth daily.   . mirtazapine (REMERON) 7.5 MG tablet Take 7.5 mg by mouth once.  Marland Kitchen  Multiple Vitamins-Minerals (PRESERVISION AREDS) CAPS Take 1 capsule by mouth 2 (two) times daily.  . Potassium Chloride Crys ER (KLOR-CON M20 PO) Take 1 tablet by mouth daily.  . sertraline (ZOLOFT) 25 MG tablet Take 25 mg by mouth daily.  . traMADol (ULTRAM) 50 MG tablet Take 50 mg by mouth in the morning and at bedtime.   . traZODone (DESYREL) 50 MG tablet Take 50 mg by mouth at bedtime.   No facility-administered encounter medications on file as of 03/31/2020.    Thank you for the opportunity to participate in the care of Troy Hicks. The palliative care team will continue to follow. Please call our office at [phone] if we can be of additional assistance.  Jari Favre, DNP, AGPCNP-BC

## 2020-05-16 DIAGNOSIS — I10 Essential (primary) hypertension: Secondary | ICD-10-CM | POA: Insufficient documentation

## 2020-05-16 DIAGNOSIS — I700 Atherosclerosis of aorta: Secondary | ICD-10-CM | POA: Insufficient documentation

## 2020-05-16 DIAGNOSIS — M47816 Spondylosis without myelopathy or radiculopathy, lumbar region: Secondary | ICD-10-CM | POA: Insufficient documentation

## 2020-05-16 DIAGNOSIS — M5136 Other intervertebral disc degeneration, lumbar region: Secondary | ICD-10-CM | POA: Insufficient documentation

## 2020-05-31 ENCOUNTER — Encounter: Payer: Self-pay | Admitting: Internal Medicine

## 2020-05-31 ENCOUNTER — Ambulatory Visit: Payer: Medicare Other | Admitting: Internal Medicine

## 2020-05-31 ENCOUNTER — Other Ambulatory Visit: Payer: Self-pay

## 2020-05-31 VITALS — BP 90/52 | HR 70 | Ht 66.0 in | Wt 174.4 lb

## 2020-05-31 DIAGNOSIS — I5084 End stage heart failure: Secondary | ICD-10-CM

## 2020-05-31 DIAGNOSIS — Z951 Presence of aortocoronary bypass graft: Secondary | ICD-10-CM | POA: Diagnosis not present

## 2020-05-31 DIAGNOSIS — I2510 Atherosclerotic heart disease of native coronary artery without angina pectoris: Secondary | ICD-10-CM | POA: Diagnosis not present

## 2020-05-31 DIAGNOSIS — I4891 Unspecified atrial fibrillation: Secondary | ICD-10-CM

## 2020-05-31 DIAGNOSIS — Z66 Do not resuscitate: Secondary | ICD-10-CM

## 2020-05-31 NOTE — Patient Instructions (Addendum)
Medication Instructions:  Your physician recommends that you continue on your current medications as directed. Please refer to the Current Medication list given to you today.  Follow lasix sliding scale in this packet as directed  *If you need a refill on your cardiac medications before your next appointment, please call your pharmacy*  Follow-Up: At [facility], you and your health needs are our priority.  As part of our continuing mission to provide you with exceptional heart care, we have created designated Provider Care Teams.  These Care Teams include your primary Cardiologist (physician) and Advanced Practice Providers (APPs -  Physician Assistants and Nurse Practitioners) who all work together to provide you with the care you need, when you need it.  We recommend signing up for the patient portal called "MyChart".  Sign up information is provided on this After Visit Summary.  MyChart is used to connect with patients for Virtual Visits (Telemedicine).  Patients are able to view lab/test results, encounter notes, upcoming appointments, etc.  Non-urgent messages can be sent to your provider as well.   To learn more about what you can do with MyChart, go to [email or web address].    Your next appointment:   6 month(s)  The format for your next appointment:   In Person  Provider:   You may see Pixie Casino, MD or one of the following Advanced Practice Providers on your designated Care Team:    Almyra Deforest, PA-C  Fabian Sharp, [place] or   Roby Lofts, [place]    Other Instructions Dr. Debara Pickett advised to check blood pressure twice daily for 1 week  - to monitor for hypotension (low blood pressure)  Sliding scale Lasix: Weigh yourself when you get home, then Daily in the Morning. Weighing daily to ensure stable fluid status, monitor for volume overload Your dry weight will be what your scale says on the day you return home. (Here is 174 lbs.)  If you gain more than 3 pounds from  dry weight in 24 hours: Take extra lasix 40 mg in the morning or lunch until weight returns to baseline dry weight.  If weight gain is greater than 5 pounds in 2 days: contact the office for further assistance   If the weight goes down more than 3 pounds from dry weight: Hold Lasix until it returns to baseline dry weight

## 2020-05-31 NOTE — Progress Notes (Signed)
OFFICE CONSULT NOTE  Chief Complaint:  Follow-up visit, shortness of breath  Primary Care Physician: [name], NP  HPI:  Troy Hicks is a 87 y.o. male who is being seen today for the evaluation of establishing cardiolgist at the request of [name]*. This is a pleasant 87 year old male veteran who was previously living in [place] for the past 40 years.  He is originally from [place].  Recently he moved down here with his wife who unfortunately recently fell and is currently hospitalized with hip fracture and A. fib.  His past medical history is significant for coronary artery disease status post coronary artery bypass grafting a number of years ago, ischemic cardiomyopathy with EF around 47 to 40%, SVT, diabetes, hypertension, lower extremity venous insufficiency and some mitral and tricuspid valvular heart disease.  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  Currently is asymptomatic denies any chest pain or worsening shortness of breath.  Family history significant for heart disease in his mother who died at age 85.  04-16-2018  Troy Hicks returns today for follow-up.  Actually this was not a routine visit.  His daughter brought him in because he has had worsening fatigue and shortness of breath.  Unfortunately his wife died in 2023-03-17.  He since then has been somewhat depressed.  He has been complaining of chronic back pain.  He is also been short of breath to her observation but denies any chest pain.  Blood pressures well controlled today.  He is actually had some weight loss.  His appetite has decreased.  He has seen a psychiatrist who prescribes medications to help with sleep and was placed on Zoloft.  08/26/2019  Troy Hicks is seen today for urgent follow-up.  He underwent an echocardiogram a for worsening shortness of breath and decreased exercise tolerance.  He had recently seen Beckie Busing, DNP in April for preoperative evaluation of a perirectal abscess.  At the time he seemed to be doing well although repeat echo was recommended.  Over the past several months he has had some progressive dyspnea and overall decline according to his daughter.  The echo yesterday showed an LVEF less than 20% which is a significant decline from his last study which showed an EF of about 30 to 35%, that in and of itself was a decline.  He denies any anginal symptoms.  Unfortunately also has advanced chronic kidney disease at least stage IIIb-IV with recent metabolic profile demonstrating a GFR of 33.  When I previously saw him I recommended Lasix 80 mg twice daily however he is only been on 80 mg once daily.  09/30/2019  Troy Hicks returns today for follow-up with his daughter.  Unfortunately was recently in the ER.  He had an episode of hypotension at his assisted living facility and it was taken to the emergency department found to be in A. fib.  The etiology of his hypotension was unclear.  After some discussion it was decided not to pursue any therapies for his A. fib given his significant cardiomyopathy which is end-stage and the fact that he had elected to be on palliative care.  He did sign DNR paperwork.  He is on low-dose aspirin.  He returns today and is in persistent A. fib which is rate controlled.  According to his daughter and himself he does not seem to be more symptomatic with the A. fib.  He is still able  to walk around at about the same pace.  We did discuss the increased risk of stroke without anticoagulation however I am very concerned about the risk of falls and given his age and decreased renal function that risk is significantly elevated.  In the interim he was seen by palliative care with [facility] care who will continue to follow with him.  11/26/2019  Troy Hicks is seen today in follow-up with his daughter.  He reports no worsening shortness of breath or chest discomfort.  In  general his weight has been fairly stable.  He seems somewhat better on higher dose Lasix.  He is also on oxygen.  He has been seen by palliative care but is not electing active treatment at this time.  Creatinine recently was 2.1 in July.  05/31/2020  Troy Hicks returns today for follow-up.  He has had some recent worsening shortness of breath.  He is undergoing breathing treatments at his facility.  Weight has been fairly stable.  He had does have some lower extremity edema.  He is on 80 mg Lasix twice daily.  EKG today shows sinus rhythm with PACs but no A. fib.  He has had some recent falls and was in the ER with this.  He continues to get palliative care services but has been fairly stable.  PMHx:  Past Medical History:  Diagnosis Date  . Chronic kidney disease (CKD), stage III (moderate) ([place])    Per New Patient Packet,PSC   . Coronary artery disease    Per New Patient Packet,PSC   . Edema    Per New Patient Packet,PSC   . Gait instability    Per New Patient Packet,PSC   . Gout    Per New Patient Packet,PSC   . Hypertension    Per New Patient Packet,PSC   . Insulin dependent diabetes mellitus    Per New Patient Packet,PSC   . Osteoarthritis    Per New Patient Packet,PSC   . Syncope 2015   Per New Patient Packet,PSC   . TIA (transient ischemic attack) 2010   Per New Patient Packet,PSC     Past Surgical History:  Procedure Laterality Date  . CORONARY ARTERY BYPASS GRAFT  1990   Per New Patient Packet,PSC   . HEMORRHOID SURGERY     over 25 to 40 years ago  . IVC FILTER INSERTION     Per New Patient Packet,PSC   . PILONIDAL CYST EXCISION  1944   Per New Patient [place]   . SKIN SURGERY  06/2018   Spot removed from head  . TONSILLECTOMY     Per New Patient Packet,PSC (childhood)     FAMHx:  Family History  Problem Relation Age of Onset  . Diabetes Mother   . Heart disease Mother   . Throat cancer Father   . Cirrhosis Son   . Alcoholism Son   . Ovarian cancer  Daughter   . Depression Daughter   . Alcoholism Daughter   . Colon cancer Neg Hx     SOCHx:   reports that he quit smoking about 63 years ago. He has a 7.50 pack-year smoking history. He has never used smokeless tobacco. He reports current alcohol use. He reports previous drug use.  ALLERGIES:  Allergies  Allergen Reactions  . Codeine     ROS: Pertinent items noted in HPI and remainder of comprehensive ROS otherwise negative.  HOME MEDS: Current Outpatient Medications on File Prior to Visit  Medication Sig Dispense Refill  .  acetaminophen (TYLENOL) 500 MG tablet Take 500 mg by mouth 3 (three) times daily.     . Alogliptin Benzoate 25 MG TABS Take 25 mg by mouth daily.     Marland Kitchen aspirin EC 81 MG tablet Take 81 mg by mouth daily.    . colchicine 0.6 MG tablet Take 0.6 mg by mouth See admin instructions. Give 1 tablet at the first sign of gout, then give another tablet 2 hours later. Only give every 14 days due to kidney function,    . colestipol (COLESTID) 1 g tablet Take 1 g by mouth every other day.     Marland Kitchen Dextran 70-Hypromellose, PF, 0.1-0.3 % SOLN Apply 1 drop to eye every 4 (four) hours as needed (apply to both eyes).    Marland Kitchen escitalopram (LEXAPRO) 10 MG tablet TAKE ONE TABLET BY MOUTH EVERY MORNING FOR ANXIETY    . furosemide (LASIX) 80 MG tablet Take 1 tablet (80 mg total) by mouth 2 (two) times daily. 180 tablet 3  . insulin glargine (LANTUS) 100 UNIT/ML injection Inject 18 Units into the skin at bedtime.     Marland Kitchen latanoprost (XALATAN) 0.005 % ophthalmic solution INSTILL 1 DROP IN BOTH EYES AT BEDTIME    . levothyroxine (SYNTHROID, LEVOTHROID) 100 MCG tablet Take 100 mcg by mouth daily before breakfast.    . lipase/protease/amylase (CREON) 36000 UNITS CPEP capsule Take 36,000 Units by mouth 2 (two) times daily.     Marland Kitchen loperamide (IMODIUM) 2 MG capsule Take 2 mg by mouth as needed for diarrhea or loose stools. Do not exceed 6 doses in 24 hours.    Marland Kitchen losartan (COZAAR) 50 MG tablet Take 50  mg by mouth daily.     . Multiple Vitamins-Minerals (PRESERVISION AREDS) CAPS Take 1 capsule by mouth 2 (two) times daily.    . Potassium Chloride Crys ER (KLOR-CON M20 PO) Take 1 tablet by mouth daily.    . traMADol (ULTRAM) 50 MG tablet Take 50 mg by mouth in the morning and at bedtime.     . traZODone (DESYREL) 50 MG tablet Take 25 mg by mouth at bedtime.     No current facility-administered medications on file prior to visit.    LABS/IMAGING: No results found for this or any previous visit (from the past 48 hour(s)). No results found.  LIPID PANEL: No results found for: CHOL, TRIG, HDL, CHOLHDL, VLDL, LDLCALC, LDLDIRECT  WEIGHTS: Wt Readings from Last 3 Encounters:  05/31/20 174 lb 6.4 oz (79.1 kg)  11/26/19 170 lb 12.8 oz (77.5 kg)  09/30/19 168 lb (76.2 kg)    VITALS: BP (!) 90/52   Pulse 70   Ht 5\' 6"  (1.676 m)   Wt 174 lb 6.4 oz (79.1 kg)   SpO2 97%   BMI 28.15 kg/m   EXAM: General appearance: alert and no distress Neck: JVD - several cm above sternal notch, no carotid bruit and thyroid not enlarged, symmetric, no tenderness/mass/nodules Lungs: diminished breath sounds bilaterally and rales RLL Heart: regular rate and rhythm, S1, S2 normal and systolic murmur: early systolic 2/6, blowing at apex Abdomen: soft, non-tender; bowel sounds normal; no masses,  no organomegaly Extremities: edema 1+ bilateral lower extremity edema Pulses: 2+ and symmetric Skin: Skin color, texture, turgor normal. No rashes or lesions Neurologic: Mental status: Alert, oriented, thought content appropriate, Hard of hearing Psych: Pleasant  EKG:  Sinus rhythm first-degree AV block and PVCs/PACs-personally reviewed  ASSESSMENT: 1. New onset A. fib with CVR -in sinus rhythm today 2. Acute  on chronic systolic congestive heart failure, LVEF now less than 20% (08/2019) 3. Coronary artery disease status post CABG 4. Ischemic cardiomyopathy EF 35 to 40%, reduced to 30 to 35%  (03/2018) 5. History of PSVT 6. Type 2 diabetes 7. Hypertension 8. Dyslipidemia 9. 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 CKD 3B/4 11. Establish with palliative care/DNR  PLAN: 1.   Troy Hicks seems relatively stable.  He has not been hospitalized for heart failure.  Neck veins are up some and he has some trace edema.  He could probably benefit from some additional Lasix which I provided a sliding scale for them.  He should have his weight monitored more closely and blood pressure.  He was hypotensive today however blood pressure is at his facility have been normal.  This should be monitored more often though because is only checked once a week.  I have advised him to check his blood pressure twice a day for a week.  Some fatigue may be due to hypotension.  For now no changes to his medications.  We will plan for 76-month follow-up or sooner as necessary  Pixie Casino, MD, [facility], [place] Director of the Advanced Lipid Disorders &  Cardiovascular Risk Reduction Clinic Diplomate of the American Board of Clinical Lipidology Attending Cardiologist  Direct Dial: [phone]  Fax: [phone]  Website:  [email or web address] 05/31/2020, 2:53 PM

## 2020-06-15 ENCOUNTER — Ambulatory Visit (INDEPENDENT_AMBULATORY_CARE_PROVIDER_SITE_OTHER): Payer: Medicare Other | Admitting: Podiatry

## 2020-06-15 ENCOUNTER — Encounter: Payer: Self-pay | Admitting: Podiatry

## 2020-06-15 ENCOUNTER — Other Ambulatory Visit: Payer: Self-pay

## 2020-06-15 DIAGNOSIS — I129 Hypertensive chronic kidney disease with stage 1 through stage 4 chronic kidney disease, or unspecified chronic kidney disease: Secondary | ICD-10-CM | POA: Insufficient documentation

## 2020-06-15 DIAGNOSIS — Z91119 Patient's noncompliance with dietary regimen due to unspecified reason: Secondary | ICD-10-CM | POA: Insufficient documentation

## 2020-06-15 DIAGNOSIS — I951 Orthostatic hypotension: Secondary | ICD-10-CM | POA: Insufficient documentation

## 2020-06-15 DIAGNOSIS — N183 Chronic kidney disease, stage 3 unspecified: Secondary | ICD-10-CM | POA: Diagnosis not present

## 2020-06-15 DIAGNOSIS — E039 Hypothyroidism, unspecified: Secondary | ICD-10-CM | POA: Insufficient documentation

## 2020-06-15 DIAGNOSIS — E785 Hyperlipidemia, unspecified: Secondary | ICD-10-CM | POA: Insufficient documentation

## 2020-06-15 DIAGNOSIS — L02219 Cutaneous abscess of trunk, unspecified: Secondary | ICD-10-CM | POA: Insufficient documentation

## 2020-06-15 DIAGNOSIS — T169XXA Foreign body in ear, unspecified ear, initial encounter: Secondary | ICD-10-CM | POA: Insufficient documentation

## 2020-06-15 DIAGNOSIS — Z961 Presence of intraocular lens: Secondary | ICD-10-CM | POA: Insufficient documentation

## 2020-06-15 DIAGNOSIS — Z6831 Body mass index (BMI) 31.0-31.9, adult: Secondary | ICD-10-CM | POA: Insufficient documentation

## 2020-06-15 DIAGNOSIS — M79674 Pain in right toe(s): Secondary | ICD-10-CM

## 2020-06-15 DIAGNOSIS — H409 Unspecified glaucoma: Secondary | ICD-10-CM | POA: Insufficient documentation

## 2020-06-15 DIAGNOSIS — R151 Fecal smearing: Secondary | ICD-10-CM | POA: Insufficient documentation

## 2020-06-15 DIAGNOSIS — M1610 Unilateral primary osteoarthritis, unspecified hip: Secondary | ICD-10-CM | POA: Insufficient documentation

## 2020-06-15 DIAGNOSIS — M109 Gout, unspecified: Secondary | ICD-10-CM | POA: Insufficient documentation

## 2020-06-15 DIAGNOSIS — E1122 Type 2 diabetes mellitus with diabetic chronic kidney disease: Secondary | ICD-10-CM | POA: Diagnosis not present

## 2020-06-15 DIAGNOSIS — H353190 Nonexudative age-related macular degeneration, unspecified eye, stage unspecified: Secondary | ICD-10-CM | POA: Insufficient documentation

## 2020-06-15 DIAGNOSIS — Z86010 Personal history of colonic polyps: Secondary | ICD-10-CM | POA: Insufficient documentation

## 2020-06-15 DIAGNOSIS — I509 Heart failure, unspecified: Secondary | ICD-10-CM | POA: Insufficient documentation

## 2020-06-15 DIAGNOSIS — B351 Tinea unguium: Secondary | ICD-10-CM | POA: Diagnosis not present

## 2020-06-15 DIAGNOSIS — E1121 Type 2 diabetes mellitus with diabetic nephropathy: Secondary | ICD-10-CM | POA: Insufficient documentation

## 2020-06-15 DIAGNOSIS — Z9181 History of falling: Secondary | ICD-10-CM | POA: Insufficient documentation

## 2020-06-15 DIAGNOSIS — M79675 Pain in left toe(s): Secondary | ICD-10-CM | POA: Diagnosis not present

## 2020-06-15 DIAGNOSIS — R69 Illness, unspecified: Secondary | ICD-10-CM | POA: Insufficient documentation

## 2020-06-15 DIAGNOSIS — I739 Peripheral vascular disease, unspecified: Secondary | ICD-10-CM | POA: Insufficient documentation

## 2020-06-15 DIAGNOSIS — E1129 Type 2 diabetes mellitus with other diabetic kidney complication: Secondary | ICD-10-CM | POA: Insufficient documentation

## 2020-06-15 DIAGNOSIS — G4700 Insomnia, unspecified: Secondary | ICD-10-CM | POA: Insufficient documentation

## 2020-06-15 DIAGNOSIS — S90129A Contusion of unspecified lesser toe(s) without damage to nail, initial encounter: Secondary | ICD-10-CM | POA: Diagnosis not present

## 2020-06-15 DIAGNOSIS — R269 Unspecified abnormalities of gait and mobility: Secondary | ICD-10-CM | POA: Insufficient documentation

## 2020-06-15 DIAGNOSIS — S60511A Abrasion of right hand, initial encounter: Secondary | ICD-10-CM | POA: Insufficient documentation

## 2020-06-15 DIAGNOSIS — Z794 Long term (current) use of insulin: Secondary | ICD-10-CM

## 2020-06-15 DIAGNOSIS — F331 Major depressive disorder, recurrent, moderate: Secondary | ICD-10-CM | POA: Insufficient documentation

## 2020-06-15 DIAGNOSIS — M47817 Spondylosis without myelopathy or radiculopathy, lumbosacral region: Secondary | ICD-10-CM | POA: Insufficient documentation

## 2020-06-15 DIAGNOSIS — K529 Noninfective gastroenteritis and colitis, unspecified: Secondary | ICD-10-CM | POA: Insufficient documentation

## 2020-06-15 DIAGNOSIS — I5032 Chronic diastolic (congestive) heart failure: Secondary | ICD-10-CM | POA: Insufficient documentation

## 2020-06-15 DIAGNOSIS — Z9111 Patient's noncompliance with dietary regimen: Secondary | ICD-10-CM | POA: Insufficient documentation

## 2020-06-15 DIAGNOSIS — Z8639 Personal history of other endocrine, nutritional and metabolic disease: Secondary | ICD-10-CM | POA: Insufficient documentation

## 2020-06-15 DIAGNOSIS — F528 Other sexual dysfunction not due to a substance or known physiological condition: Secondary | ICD-10-CM | POA: Insufficient documentation

## 2020-06-15 DIAGNOSIS — F4321 Adjustment disorder with depressed mood: Secondary | ICD-10-CM | POA: Insufficient documentation

## 2020-06-15 DIAGNOSIS — R296 Repeated falls: Secondary | ICD-10-CM | POA: Insufficient documentation

## 2020-06-15 DIAGNOSIS — I480 Paroxysmal atrial fibrillation: Secondary | ICD-10-CM | POA: Insufficient documentation

## 2020-06-15 DIAGNOSIS — L570 Actinic keratosis: Secondary | ICD-10-CM | POA: Insufficient documentation

## 2020-06-15 DIAGNOSIS — I519 Heart disease, unspecified: Secondary | ICD-10-CM | POA: Insufficient documentation

## 2020-06-15 DIAGNOSIS — L408 Other psoriasis: Secondary | ICD-10-CM | POA: Insufficient documentation

## 2020-06-15 DIAGNOSIS — M159 Polyosteoarthritis, unspecified: Secondary | ICD-10-CM | POA: Insufficient documentation

## 2020-06-15 DIAGNOSIS — I2581 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: Secondary | ICD-10-CM | POA: Insufficient documentation

## 2020-06-15 DIAGNOSIS — G8929 Other chronic pain: Secondary | ICD-10-CM | POA: Insufficient documentation

## 2020-06-15 DIAGNOSIS — E7800 Pure hypercholesterolemia, unspecified: Secondary | ICD-10-CM | POA: Insufficient documentation

## 2020-06-15 DIAGNOSIS — R1319 Other dysphagia: Secondary | ICD-10-CM | POA: Insufficient documentation

## 2020-06-15 DIAGNOSIS — K623 Rectal prolapse: Secondary | ICD-10-CM | POA: Insufficient documentation

## 2020-06-15 DIAGNOSIS — E1165 Type 2 diabetes mellitus with hyperglycemia: Secondary | ICD-10-CM | POA: Insufficient documentation

## 2020-06-15 DIAGNOSIS — R609 Edema, unspecified: Secondary | ICD-10-CM | POA: Insufficient documentation

## 2020-06-15 DIAGNOSIS — M545 Low back pain, unspecified: Secondary | ICD-10-CM | POA: Insufficient documentation

## 2020-06-15 DIAGNOSIS — F0390 Unspecified dementia without behavioral disturbance: Secondary | ICD-10-CM | POA: Insufficient documentation

## 2020-06-15 NOTE — Progress Notes (Signed)
Subjective: Troy Hicks presents today at risk foot care. Pt has h/o NIDDM with chronic kidney disease and painful mycotic nails b/l that are difficult to trim. Pain interferes with ambulation. Aggravating factors include wearing enclosed shoe gear. Pain is relieved with periodic professional debridement.  He resides at [facility] in [place]. His daughter is present during today's visit.   His blood glucose was 130 mg/dl this morning.  He voices no new pedal concerns on today's visit. He has a bruise I pointed out on the tip of his left 3rd toe and he cannot relate any episode of trauma. It is not painful.   Troy Sessions, NP is patient's PCP. Last visit was 06/01/2019.  Allergies  Allergen Reactions  . Codeine   . Lisinopril Cough and Other (See Comments)  . Metformin Other (See Comments)  . Other Rash   Objective: Troy Hicks is a pleasant 87 y.o.  Caucasian male, in NAD. AAO x 3.  There were no vitals filed for this visit.  Vascular Examination: Neurovascular status unchanged b/l. Capillary refill time to digits immediate b/l. Faintly palpable pedal pulses b/l. Pedal hair absent b/l Skin temperature gradient within normal limits b/l. +1 pitting edema b/l LE.  Dermatological Examination: Pedal skin is thin shiny, atrophic b/l lower extremities. No open wounds bilaterally. No interdigital macerations bilaterally. Toenails 1-5 b/l elongated, discolored, dystrophic, thickened, crumbly with subungual debris and tenderness to dorsal palpation.   Ecchymosis noted distal tip of left 3rd digit. No edema, no pain on palpation.  Musculoskeletal: Normal muscle strength 5/5 to all lower extremity muscle groups bilaterally. No pain crepitus or joint limitation noted with ROM b/l. Hallux valgus with bunion deformity noted b/l lower extremities. No pain on palpation left 3rd digit, at MPJ or along 3rd metatarsal.   Neurological  Examination: Protective sensation intact 5/5 intact bilaterally with 10g monofilament b/l. Vibratory sensation intact b/l. Clonus negative b/l.  Assessment: 1. Pain due to onychomycosis of toenails of both feet   2. Superficial bruising of toe   3. Type 2 diabetes mellitus with stage 3 chronic kidney disease, with long-term current use of insulin, unspecified whether stage 3a or 3b CKD ([place])    Plan: -Examined patient. -Continue diabetic foot care principles. -Toenails 1-5 b/l were debrided in length and girth with sterile nail nippers and dremel without iatrogenic bleeding.  -Discussed bruising of left 3rd digit. He is asymptomatic today, so we will monitor and daughter will call should there be any changes. -Patient to continue soft, supportive shoe gear daily. -Patient to report any pedal injuries to medical professional immediately. -Patient/POA to call should there be question/concern in the interim.  Return in about 3 months (around 09/15/2020).  Troy Hicks, DPM

## 2020-07-19 ENCOUNTER — Emergency Department (HOSPITAL_COMMUNITY): Payer: Medicare Other

## 2020-07-19 ENCOUNTER — Encounter (HOSPITAL_COMMUNITY): Payer: Self-pay

## 2020-07-19 ENCOUNTER — Other Ambulatory Visit: Payer: Self-pay

## 2020-07-19 ENCOUNTER — Emergency Department (HOSPITAL_COMMUNITY)
Admission: EM | Admit: 2020-07-19 | Discharge: 2020-07-19 | Disposition: A | Discharge status: Home / Self Care | Payer: Medicare Other | Attending: Emergency Medicine | Admitting: Emergency Medicine

## 2020-07-19 DIAGNOSIS — Y9301 Activity, walking, marching and hiking: Secondary | ICD-10-CM | POA: Insufficient documentation

## 2020-07-19 DIAGNOSIS — Z951 Presence of aortocoronary bypass graft: Secondary | ICD-10-CM | POA: Diagnosis not present

## 2020-07-19 DIAGNOSIS — Z23 Encounter for immunization: Secondary | ICD-10-CM | POA: Diagnosis not present

## 2020-07-19 DIAGNOSIS — N183 Chronic kidney disease, stage 3 unspecified: Secondary | ICD-10-CM | POA: Insufficient documentation

## 2020-07-19 DIAGNOSIS — Z794 Long term (current) use of insulin: Secondary | ICD-10-CM | POA: Insufficient documentation

## 2020-07-19 DIAGNOSIS — E039 Hypothyroidism, unspecified: Secondary | ICD-10-CM | POA: Diagnosis not present

## 2020-07-19 DIAGNOSIS — W01198A Fall on same level from slipping, tripping and stumbling with subsequent striking against other object, initial encounter: Secondary | ICD-10-CM | POA: Diagnosis not present

## 2020-07-19 DIAGNOSIS — S51011A Laceration without foreign body of right elbow, initial encounter: Secondary | ICD-10-CM | POA: Insufficient documentation

## 2020-07-19 DIAGNOSIS — Z7984 Long term (current) use of oral hypoglycemic drugs: Secondary | ICD-10-CM | POA: Insufficient documentation

## 2020-07-19 DIAGNOSIS — I5032 Chronic diastolic (congestive) heart failure: Secondary | ICD-10-CM | POA: Diagnosis not present

## 2020-07-19 DIAGNOSIS — S61412A Laceration without foreign body of left hand, initial encounter: Secondary | ICD-10-CM | POA: Diagnosis not present

## 2020-07-19 DIAGNOSIS — Z87891 Personal history of nicotine dependence: Secondary | ICD-10-CM | POA: Diagnosis not present

## 2020-07-19 DIAGNOSIS — Z79899 Other long term (current) drug therapy: Secondary | ICD-10-CM | POA: Diagnosis not present

## 2020-07-19 DIAGNOSIS — I2510 Atherosclerotic heart disease of native coronary artery without angina pectoris: Secondary | ICD-10-CM | POA: Diagnosis not present

## 2020-07-19 DIAGNOSIS — S6992XA Unspecified injury of left wrist, hand and finger(s), initial encounter: Secondary | ICD-10-CM | POA: Diagnosis present

## 2020-07-19 DIAGNOSIS — Z7982 Long term (current) use of aspirin: Secondary | ICD-10-CM | POA: Diagnosis not present

## 2020-07-19 DIAGNOSIS — F0390 Unspecified dementia without behavioral disturbance: Secondary | ICD-10-CM | POA: Insufficient documentation

## 2020-07-19 DIAGNOSIS — S81811A Laceration without foreign body, right lower leg, initial encounter: Secondary | ICD-10-CM | POA: Insufficient documentation

## 2020-07-19 DIAGNOSIS — E1122 Type 2 diabetes mellitus with diabetic chronic kidney disease: Secondary | ICD-10-CM | POA: Diagnosis not present

## 2020-07-19 DIAGNOSIS — T148XXA Other injury of unspecified body region, initial encounter: Secondary | ICD-10-CM

## 2020-07-19 DIAGNOSIS — I130 Hypertensive heart and chronic kidney disease with heart failure and stage 1 through stage 4 chronic kidney disease, or unspecified chronic kidney disease: Secondary | ICD-10-CM | POA: Diagnosis not present

## 2020-07-19 DIAGNOSIS — W19XXXA Unspecified fall, initial encounter: Secondary | ICD-10-CM

## 2020-07-19 LAB — CBG MONITORING, ED: Glucose-Capillary: 88 mg/dL (ref 70–99)

## 2020-07-19 MED ORDER — BACITRACIN ZINC 500 UNIT/GM EX OINT: 1.0000 "application " | TOPICAL_OINTMENT | Freq: Two times a day (BID) | CUTANEOUS | 0 refills | Status: DC

## 2020-07-19 MED ORDER — TETANUS-DIPHTH-ACELL PERTUSSIS 5-2.5-18.5 LF-MCG/0.5 IM SUSY
0.5000 mL | PREFILLED_SYRINGE | Freq: Once | INTRAMUSCULAR | Status: AC
  Administered 2020-07-19: 0.5 mL via INTRAMUSCULAR
  Filled 2020-07-19: qty 0.5

## 2020-07-19 NOTE — ED Provider Notes (Signed)
[facility] DEPARTMENT Provider Note   CSN: [id number] Arrival date & time: 07/19/20  1021     History Chief Complaint  Patient presents with  . Troy Hicks is a 87 y.o. male.  87 year old male with past medical history below including CKD, CAD, hypertension, IDDM, TIA who presents with fall.  Just prior to arrival, patient states that he was walking with his walker to breakfast when his knees gave out and he fell, bumping his right elbow and left hand and sustaining some skin tears.  He did not hit his head or lose consciousness.  He denies any areas of pain, including no chest, back, head, or abdominal pain.  He denies any joint pains at his areas of skin tears.  He does have history of frequent falls.  The history is provided by the patient and the nursing home.       Past Medical History:  Diagnosis Date  . Chronic kidney disease (CKD), stage III (moderate) ([place])    Per [facility]   . Coronary artery disease    Per [facility]   . Edema    Per [facility]   . Gait instability    Per [facility]   . Gout    Per [facility]   . Hypertension    Per [facility]   . Insulin dependent diabetes mellitus    Per [facility]   . Osteoarthritis    Per [facility]   . Syncope 2015   Per [facility]   . TIA (transient ischemic attack) 2010   Per New Patient [place]     Patient Active Problem List   Diagnosis Date Noted  . Abnormal gait 06/15/2020  . Abrasion of right hand 06/15/2020  . Actinic keratosis 06/15/2020  . Adjustment disorder with depressed mood 06/15/2020  . Arthropathy of pelvic region and thigh 06/15/2020  . 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 06/15/2020  . Bilateral pseudophakia 06/15/2020  . Body mass index (BMI) 31.0-31.9, adult 06/15/2020  . Cellulitis and abscess of trunk  06/15/2020  . Chronic diarrhea of unknown origin 06/15/2020  . Chronic diastolic heart failure ([place]) 06/15/2020  . Chronic pain 06/15/2020  . Dementia ([place]) 06/15/2020  . Diabetic renal disease ([place]) 06/15/2020  . Edema 06/15/2020  . Esophageal dysphagia 06/15/2020  . Foreign body in ear 06/15/2020  . Fecal smearing 06/15/2020  . Generalized osteoarthritis 06/15/2020  . Glaucoma 06/15/2020  . Heart disease 06/15/2020  . Peripheral vascular disease ([place]) 06/15/2020  . Heart failure ([place]) 06/15/2020  . History of colonic polyps 06/15/2020  . History of endocrine disorder 06/15/2020  . History of fall 06/15/2020  . Hyperglycemia due to type 2 diabetes mellitus ([place]) 06/15/2020  . Hyperlipidemia 06/15/2020  . Hypothyroid 06/15/2020  . Long term (current) use of insulin ([place]) 06/15/2020  . Low back pain 06/15/2020  . Lumbosacral spondylosis without myelopathy 06/15/2020  . Moderate recurrent major depression ([place]) 06/15/2020  . Morbid obesity ([place]) 06/15/2020  . Nonexudative age-related macular degeneration 06/15/2020  . Orthostatic hypotension 06/15/2020  . Other ill-defined and unknown causes of morbidity and mortality 06/15/2020  . Other psoriasis 06/15/2020  . Paroxysmal atrial fibrillation ([place]) 06/15/2020  . Patient's noncompliance with dietary regimen 06/15/2020  . Gout 06/15/2020  . Insomnia 06/15/2020  . Psychosexual dysfunction with inhibited sexual excitement 06/15/2020  . Pure hypercholesterolemia 06/15/2020  . Rectal prolapse  06/15/2020  . Recurrent falls 06/15/2020  . Chronic kidney disease due to hypertension 06/15/2020  . Type 2 diabetes mellitus with other diabetic kidney complication ([place]) 03/27/3233  . Abdominal aortic atherosclerosis ([place]) 05/16/2020  . Degeneration of lumbar intervertebral disc 05/16/2020  . Essential hypertension 05/16/2020  . Lumbar spondylosis 05/16/2020  . Thrombocytopenia ([place]) 07/21/2019  . Perianal abscess 05/14/2019  . Diarrhea  05/14/2019  . 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 Osteoarthritis of glenohumeral joint 11/26/2018  . Pain in joint of left shoulder 11/25/2018  . Pain in joint of right shoulder 11/25/2018  . Pain in elbow 11/25/2018  . Shortness of breath 04/10/2018  . Ischemic cardiomyopathy 04/10/2018    Past Surgical History:  Procedure Laterality Date  . CORONARY ARTERY BYPASS GRAFT  1990   Per [facility] Patient Packet,[facility]   . HEMORRHOID SURGERY     over 25 to 40 years ago  . IVC FILTER INSERTION     Per [facility] Patient Packet,[facility]   . PILONIDAL CYST EXCISION  1944   Per New Patient [place]   . SKIN SURGERY  06/2018   Spot removed from head  . TONSILLECTOMY     Per [facility] Patient Packet,[facility] (childhood)        Family History  Problem Relation Age of Onset  . Diabetes Mother   . Heart disease Mother   . Throat cancer Father   . Cirrhosis Son   . Alcoholism Son   . Ovarian cancer Daughter   . Depression Daughter   . Alcoholism Daughter   . Colon cancer Neg Hx     Social History   Tobacco Use  . Smoking status: Former Smoker    Packs/day: 0.50    Years: 15.00    Pack years: 7.50    Quit date: 03/19/1957    Years since quitting: 63.3  . Smokeless tobacco: Never Used  . Tobacco comment: 60 years ago as of 2019   Vaping Use  . Vaping Use: Never used  Substance Use Topics  . Alcohol use: Yes    Comment: 1 drink per day  . Drug use: Not Currently    Home Medications Prior to Admission medications   Medication Sig Start Date End Date Taking? Authorizing Provider  acetaminophen (TYLENOL) 500 MG tablet Take 500 mg by mouth 3 (three) times daily.    Yes [provider]  Alcohol Swabs (ALCOHOL WIPES) 70 % PADS USE FOR INSULIN INJECTIONS OR TO FINGER STICK AS DIRECTED 03/29/20  Yes [provider]  Alogliptin Benzoate 25 MG TABS Take 25 mg by mouth daily.    Yes [provider]  aspirin EC 81 MG tablet Take 81 mg by mouth daily.   Yes [provider]   bacitracin ointment Apply 1 application topically 2 (two) times daily. 07/19/20  Yes Harue Pribble, Wenda Overland, MD  colchicine 0.6 MG tablet Take 0.6 mg by mouth See admin instructions. Give 1 tablet at the first sign of gout, then give another tablet 2 hours later. Only give every 14 days due to kidney function,   Yes [provider]  colestipol (COLESTID) 1 g tablet Take 1 g by mouth every other day.    Yes [provider]  Dextran 70-Hypromellose, PF, 0.1-0.3 % SOLN Place 1 drop into both eyes 3 (three) times daily.   Yes [provider]  escitalopram (LEXAPRO) 10 MG tablet Take 10 mg by mouth every morning. 05/18/20  Yes [provider]  furosemide (LASIX) 80 MG tablet  Take 1 tablet (80 mg total) by mouth 2 (two) times daily. 08/26/19  Yes Hilty, Nadean Corwin, MD  insulin glargine (LANTUS) 100 UNIT/ML injection Inject 35 Units into the skin at bedtime.   Yes [provider]  latanoprost (XALATAN) 0.005 % ophthalmic solution Place 1 drop into both eyes at bedtime. 03/16/20  Yes [provider]  levothyroxine (SYNTHROID, LEVOTHROID) 100 MCG tablet Take 100 mcg by mouth daily before breakfast.   Yes [provider]  lipase/protease/amylase (CREON) 36000 UNITS CPEP capsule Take 36,000 Units by mouth 2 (two) times daily.    Yes [provider]  loperamide (IMODIUM) 2 MG capsule Take 2 mg by mouth as needed for diarrhea or loose stools. Do not exceed 6 doses in 24 hours.   Yes [provider]  losartan (COZAAR) 50 MG tablet Take 50 mg by mouth daily.    Yes [provider]  Multiple Vitamins-Minerals (PRESERVISION AREDS) CAPS Take 1 capsule by mouth 2 (two) times daily.   Yes [provider]  Potassium Chloride Crys ER (KLOR-CON M20 PO) Take 20 mEq by mouth daily.   Yes [provider]  Sennosides 8.6 MG CAPS Take 8.6 mg by mouth every 12 (twelve) hours as needed (constipation).   Yes [provider]   traMADol (ULTRAM) 50 MG tablet Take 50 mg by mouth in the morning and at bedtime.    Yes [provider]  traZODone (DESYREL) 50 MG tablet Take 25 mg by mouth at bedtime.   Yes [provider]    Allergies    Codeine, Lisinopril, Metformin, and Other  Review of Systems   Review of Systems All other systems reviewed and are negative except that which was mentioned in HPI  Physical Exam Updated Vital Signs BP (!) 145/86   Pulse 87   Resp 14   SpO2 95%   Physical Exam Constitutional:      General: He is not in acute distress.    Appearance: Normal appearance.  HENT:     Head: Normocephalic and atraumatic.     Mouth/Throat:     Mouth: Mucous membranes are moist.     Pharynx: Oropharynx is clear.  Eyes:     Conjunctiva/sclera: Conjunctivae normal.     Pupils: Pupils are equal, round, and reactive to light.  Cardiovascular:     Rate and Rhythm: Normal rate and regular rhythm.     Heart sounds: Normal heart sounds. No murmur heard.   Pulmonary:     Effort: Pulmonary effort is normal.     Breath sounds: Normal breath sounds.  Chest:     Chest wall: No tenderness.  Abdominal:     General: Abdomen is flat. Bowel sounds are normal. There is no distension.     Palpations: Abdomen is soft.     Tenderness: There is no abdominal tenderness.  Musculoskeletal:        General: Normal range of motion.     Cervical back: Neck supple. No tenderness.     Right lower leg: No edema.     Left lower leg: No edema.     Comments: Normal range of motion at left elbow, right hand, bilateral hips and knees without pain  Skin:    General: Skin is warm and dry.     Comments: Skin tear on right elbow and lateral left hand; healing skin tears on right lower leg and scattered areas of healing ecchymoses  Neurological:     Mental Status: He is  alert and oriented to person, place, and time.     Comments: Fluent speech, hard of hearing  Psychiatric:        Mood and Affect: Mood  normal.        Behavior: Behavior normal.     ED Results / Procedures / Treatments   Labs (all labs ordered are listed, but only abnormal results are displayed) Labs Reviewed  CBG MONITORING, ED    EKG EKG Interpretation  Date/Time:  Tuesday Jul 19 2020 10:28:28 EDT Ventricular Rate:  83 PR Interval:  202 QRS Duration: 154 QT Interval:  380 QTC Calculation: 447 R Axis:   -67 Text Interpretation: Sinus rhythm Atrial premature complexes Left bundle branch block No significant change since last tracing Confirmed by Theotis Burrow [phone]) on 07/19/2020 10:31:57 AM   Radiology CT Head Wo Contrast  Result Date: 07/19/2020 CLINICAL DATA:  Head trauma, moderate/severe. Fall. Poly trauma, critical, head/cervical spine injury suspected. EXAM: CT HEAD WITHOUT CONTRAST CT CERVICAL SPINE WITHOUT CONTRAST TECHNIQUE: Multidetector CT imaging of the head and cervical spine was performed following the standard protocol without intravenous contrast. Multiplanar CT image reconstructions of the cervical spine were also generated. COMPARISON:  Head CT 12/10/2019. FINDINGS: CT HEAD FINDINGS Brain: Mild-to-moderate cerebral atrophy. Redemonstrated small chronic right parietooccipital lobe cortical infarct (series 5, image 64). Redemonstrated chronic lacunar infarct within the left caudate nucleus. Background mild ill-defined hypoattenuation within the cerebral white matter is nonspecific, but compatible with chronic small vessel ischemic disease. There is no acute intracranial hemorrhage. No acute demarcated cortical infarct. No extra-axial fluid collection. No evidence of intracranial mass. No midline shift. Vascular: No hyperdense vessel.  Atherosclerotic calcifications Skull: Normal. Negative for fracture or focal lesion. Sinuses/Orbits: Visualized orbits show no acute finding. Small mucous retention cyst within the left frontal sinus. Mild bilateral ethmoid sinus mucosal thickening. Mild mucosal thickening and  small mucous retention cysts within the bilateral maxillary sinuses. Other: Right mastoid effusion. CT CERVICAL SPINE FINDINGS Mildly motion degraded exam. Alignment: Straightening of the expected cervical lordosis. 2 mm C3-C4 and C4-C5 grade 1 anterolisthesis. 2 mm C7-T1 grade 1 anterolisthesis. Skull base and vertebrae: The basion-dental and atlanto-dental intervals are maintained.No evidence of acute fracture to the cervical spine. Soft tissues and spinal canal: No prevertebral fluid or swelling. No visible canal hematoma. Disc levels: Cervical spondylosis with multilevel disc space narrowing, disc bulges, endplate spurring, posterior disc osteophytes, uncovertebral hypertrophy and facet arthrosis. Disc space narrowing is severe at C5-C6 and C6-C7. Multilevel spinal canal stenosis. Most notably, there is apparent at least moderate spinal canal stenosis at C3-C4, C5-C6 and C6-C7. Multilevel bony neural foraminal narrowing. Degenerative changes also present at the C1-C2 articulation. Upper chest: No consolidation within the imaged lung apices. No visible pneumothorax. Other: The imaged esophagus is slightly patulous and contains debris. IMPRESSION: CT head: 1. No evidence of acute intracranial abnormality. 2. Redemonstrated small chronic right parietooccipital lobe cortical infarct. 3. Redemonstrated chronic left basal ganglia lacunar infarct. 4. Stable background parenchymal atrophy and cerebral white matter chronic small vessel ischemic disease. 5. Mild paranasal sinus disease as described. 6. Right mastoid effusion. CT cervical spine: 1. No evidence of acute fracture to the cervical spine. 2. 2 mm grade 1 anterolisthesis at C3-C4, C4-C5 and C7-T1. 3. Cervical spondylosis, as described. Notably, there is at least moderate spinal canal stenosis at C3-C4, C5-C6 and C6-C7. Multilevel bony neural foraminal narrowing. 4. The partially imaged esophagus is slightly patulous and contains debris. Electronically Signed    By: Kellie Simmering  DO   On: 07/19/2020 13:58   CT Cervical Spine Wo Contrast  Result Date: 07/19/2020 CLINICAL DATA:  Head trauma, moderate/severe. Fall. Poly trauma, critical, head/cervical spine injury suspected. EXAM: CT HEAD WITHOUT CONTRAST CT CERVICAL SPINE WITHOUT CONTRAST TECHNIQUE: Multidetector CT imaging of the head and cervical spine was performed following the standard protocol without intravenous contrast. Multiplanar CT image reconstructions of the cervical spine were also generated. COMPARISON:  Head CT 12/10/2019. FINDINGS: CT HEAD FINDINGS Brain: Mild-to-moderate cerebral atrophy. Redemonstrated small chronic right parietooccipital lobe cortical infarct (series 5, image 64). Redemonstrated chronic lacunar infarct within the left caudate nucleus. Background mild ill-defined hypoattenuation within the cerebral white matter is nonspecific, but compatible with chronic small vessel ischemic disease. There is no acute intracranial hemorrhage. No acute demarcated cortical infarct. No extra-axial fluid collection. No evidence of intracranial mass. No midline shift. Vascular: No hyperdense vessel.  Atherosclerotic calcifications Skull: Normal. Negative for fracture or focal lesion. Sinuses/Orbits: Visualized orbits show no acute finding. Small mucous retention cyst within the left frontal sinus. Mild bilateral ethmoid sinus mucosal thickening. Mild mucosal thickening and small mucous retention cysts within the bilateral maxillary sinuses. Other: Right mastoid effusion. CT CERVICAL SPINE FINDINGS Mildly motion degraded exam. Alignment: Straightening of the expected cervical lordosis. 2 mm C3-C4 and C4-C5 grade 1 anterolisthesis. 2 mm C7-T1 grade 1 anterolisthesis. Skull base and vertebrae: The basion-dental and atlanto-dental intervals are maintained.No evidence of acute fracture to the cervical spine. Soft tissues and spinal canal: No prevertebral fluid or swelling. No visible canal hematoma. Disc levels:  Cervical spondylosis with multilevel disc space narrowing, disc bulges, endplate spurring, posterior disc osteophytes, uncovertebral hypertrophy and facet arthrosis. Disc space narrowing is severe at C5-C6 and C6-C7. Multilevel spinal canal stenosis. Most notably, there is apparent at least moderate spinal canal stenosis at C3-C4, C5-C6 and C6-C7. Multilevel bony neural foraminal narrowing. Degenerative changes also present at the C1-C2 articulation. Upper chest: No consolidation within the imaged lung apices. No visible pneumothorax. Other: The imaged esophagus is slightly patulous and contains debris. IMPRESSION: CT head: 1. No evidence of acute intracranial abnormality. 2. Redemonstrated small chronic right parietooccipital lobe cortical infarct. 3. Redemonstrated chronic left basal ganglia lacunar infarct. 4. Stable background parenchymal atrophy and cerebral white matter chronic small vessel ischemic disease. 5. Mild paranasal sinus disease as described. 6. Right mastoid effusion. CT cervical spine: 1. No evidence of acute fracture to the cervical spine. 2. 2 mm grade 1 anterolisthesis at C3-C4, C4-C5 and C7-T1. 3. Cervical spondylosis, as described. Notably, there is at least moderate spinal canal stenosis at C3-C4, C5-C6 and C6-C7. Multilevel bony neural foraminal narrowing. 4. The partially imaged esophagus is slightly patulous and contains debris. Electronically Signed   By: Kellie Simmering DO   On: 07/19/2020 13:58   DG Hand Complete Left  Result Date: 07/19/2020 CLINICAL DATA:  Fall.  Laceration to the ulnar aspect of the hand. EXAM: LEFT HAND - COMPLETE 3+ VIEW COMPARISON:  None. FINDINGS: No fracture or bone lesion. Skeletal structures are diffusely demineralized. There is joint space narrowing with mild subchondral sclerosis and subchondral cystic change at the second metatarsophalangeal joint with joint space narrowing and subchondral sclerosis and marginal osteophytes noted at the scaphoid trapezium  and trapezium first metacarpal articulation. Lesser degrees degenerative change are noted involving several of the IP joints. Evidence of an ulnar sided soft tissue laceration. No radiopaque foreign body. IMPRESSION: 1. No fracture or dislocation. 2. Ulnar-sided soft tissue laceration.  No radiopaque foreign body. Electronically Signed  By: Lajean Manes M.D.   On: 07/19/2020 11:38    Procedures Procedures   Medications Ordered in ED Medications  Tdap (BOOSTRIX) injection 0.5 mL (0.5 mLs Intramuscular Given 07/19/20 1314)    ED Course  I have reviewed the triage vital signs and the nursing notes.  Pertinent labs & imaging results that were available during my care of the patient were reviewed by me and considered in my medical decision making (see chart for details).    MDM Rules/Calculators/A&P                          Alert and comfortable on exam, reassuring vital signs.  No complaints.  Chart review shows Tdap is not up-to-date therefore ordered booster.  All wounds are skin tears and do not require repair, dressing applied by nurse.   Daughter later arrived and stated that facility said he may have hit head during fall. Obtained CT head and c-spine that were negative acute, show old strokes. Fall sounds mechanical rather than syncope, I do not feel he needs further w/u at this time. Discussed wound care.  I have discussed return precautions with him and daughter and they voiced understanding. Final Clinical Impression(s) / ED Diagnoses Final diagnoses:  Fall, initial encounter  Multiple skin tears    Rx / DC Orders ED Discharge Orders         Ordered    bacitracin ointment  2 times daily        07/19/20 1451           Mikesha Migliaccio, Wenda Overland, MD 07/19/20 1457

## 2020-07-19 NOTE — ED Notes (Signed)
Patient transported to X-ray 

## 2020-07-19 NOTE — Discharge Instructions (Signed)
Keep wounds clean, dry, and bandaged. Apply neosporin/bacitracin once or twice daily. Seek medical attention of wounds have any signs of infection (redness, drainage, swelling, increased pain.)

## 2020-07-19 NOTE — ED Notes (Signed)
Patient transported to CT 

## 2020-07-19 NOTE — ED Triage Notes (Signed)
Pt BIB [facility] from [facility] in [facility] d/t a fall caused by his knees giving out (per pt). EMS reports staff saying pt c/o dizziness but pt denies that complaint while ed route to ED. Skin tear was noted to Rt Elbow, Lt Hand, & Rt pinky. Denies hitting his head or LOC during the event.

## 2020-09-21 ENCOUNTER — Ambulatory Visit (INDEPENDENT_AMBULATORY_CARE_PROVIDER_SITE_OTHER): Payer: Medicare Other | Admitting: General Practice

## 2020-09-21 ENCOUNTER — Encounter: Payer: Self-pay | Admitting: General Practice

## 2020-09-21 ENCOUNTER — Other Ambulatory Visit: Payer: Self-pay

## 2020-09-21 VITALS — BP 108/60 | HR 95 | Ht 66.0 in | Wt 167.0 lb

## 2020-09-21 DIAGNOSIS — I255 Ischemic cardiomyopathy: Secondary | ICD-10-CM | POA: Diagnosis not present

## 2020-09-21 DIAGNOSIS — I10 Essential (primary) hypertension: Secondary | ICD-10-CM

## 2020-09-21 DIAGNOSIS — I4891 Unspecified atrial fibrillation: Secondary | ICD-10-CM | POA: Diagnosis not present

## 2020-09-21 DIAGNOSIS — I5022 Chronic systolic (congestive) heart failure: Secondary | ICD-10-CM | POA: Diagnosis not present

## 2020-09-21 NOTE — Progress Notes (Signed)
Cardiology Clinic Note   Patient Name: Troy Hicks Date of Encounter: 09/21/2020  Primary Care Provider:  [name], NP Primary Cardiologist:  Pixie Casino, MD  Patient Profile    Troy Hicks 87 year old male presents the clinic today for follow-up evaluation of his atrial fibrillation and fatigue.  Past Medical History    Past Medical History:  Diagnosis Date   Chronic kidney disease (CKD), stage III (moderate) ([place])    Per New Patient Packet,PSC    Coronary artery disease    Per New Patient Packet,PSC    Edema    Per New Patient Packet,PSC    Gait instability    Per New Patient Packet,PSC    Gout    Per New Patient [place]    Hypertension    Per New Patient Packet,PSC    Insulin dependent diabetes mellitus    Per New Patient Packet,PSC    Osteoarthritis    Per New Patient [place]    Syncope 2015   Per New Patient Packet,PSC    TIA (transient ischemic attack) 2010   Per New Patient [place]    Past Surgical History:  Procedure Laterality Date   CORONARY ARTERY BYPASS GRAFT  1990   Per New Patient [place]     over 25 to 40 years ago   IVC FILTER INSERTION     Per New Patient [place]   Per New Patient [place]    SKIN SURGERY  06/2018   Spot removed from head   TONSILLECTOMY     Per New Patient [place] (childhood)     Allergies  Allergies  Allergen Reactions   Codeine    Other Rash    History of Present Illness    Troy Hicks has a PMH of atrial fibrillation, coronary artery disease status post CABG, ischemic cardiomyopathy EF 35-40%, SVT, diabetes, HTN, lower extremity venous insufficiency, mitral and tricuspid valve disease, and DVT.  He is status post permanent IVC filter.  He has been noted to have episodes of hypotension, fatigue, and shortness of breath.  His wife passed away 03-29-2023.  Due to his atrial fibrillation and significant cardiomyopathy  (end-stage) palliative care was consulted.  He signed DNR paperwork.  Therapy score is significant atrial fibrillation when not pursued and he was placed on low-dose aspirin.  He was seen in follow-up on 11/26/2019.  During that time he presented with his daughter he complained of worsening shortness of breath and chest discomfort.  His weight had remained stable.  He was tolerating his higher dose of furosemide well.  He required oxygen.  He continued to work with palliative care but was not electing active treatment at that time.  His creatinine was 2.1 on 7/21.  He was seen in follow-up on 05/31/2020.  He reported worsening shortness of breath.  He was requiring breathing treatments at his facility.  His weight was stable.  He did have some lower extremity edema.  He was using furosemide 80 mg twice daily.  His EKG showed sinus rhythm with PACs but no atrial fibrillation.  He reported some recent falls and was in the emergency room.  He continued to use palliative services but had been stable with self.  He presents the clinic today for follow-up evaluation states he is noticing increased fatigue with increased physical activity.  His daughter reports that she is on board with increasing palliative care measures.  She finds it hard  to watch him with his increased work of breathing.  We reviewed his echocardiogram, discussed heart failure, and renal failure.  They expressed understanding.  We reviewed the importance of diet and fluid restriction.  Shared decision making was used to agreed to not increase medications and move follow-up appointments to as needed.   Home Medications    Prior to Admission medications   Medication Sig Start Date End Date Taking? Authorizing Provider  acetaminophen (TYLENOL) 500 MG tablet Take 500 mg by mouth 3 (three) times daily.     [provider]  Alcohol Swabs (ALCOHOL WIPES) 70 % PADS USE FOR INSULIN INJECTIONS OR TO FINGER STICK AS DIRECTED 03/29/20   [provider]  Alogliptin Benzoate 25 MG TABS Take 25 mg by mouth daily.     [provider]  aspirin EC 81 MG tablet Take 81 mg by mouth daily.    [provider]  bacitracin ointment Apply 1 application topically 2 (two) times daily. 07/19/20   Little, Wenda Overland, MD  colchicine 0.6 MG tablet Take 0.6 mg by mouth See admin instructions. Give 1 tablet at the first sign of gout, then give another tablet 2 hours later. Only give every 14 days due to kidney function,    [provider]  colestipol (COLESTID) 1 g tablet Take 1 g by mouth every other day.     [provider]  Dextran 70-Hypromellose, PF, 0.1-0.3 % SOLN Place 1 drop into both eyes 3 (three) times daily.    [provider]  escitalopram (LEXAPRO) 10 MG tablet Take 10 mg by mouth every morning. 05/18/20   [provider]  furosemide (LASIX) 80 MG tablet Take 1 tablet (80 mg total) by mouth 2 (two) times daily. 08/26/19   Hilty, Nadean Corwin, MD  insulin glargine (LANTUS) 100 UNIT/ML injection Inject 35 Units into the skin at bedtime.    [provider]  latanoprost (XALATAN) 0.005 % ophthalmic solution Place 1 drop into both eyes at bedtime. 03/16/20   [provider]  levothyroxine (SYNTHROID, LEVOTHROID) 100 MCG tablet Take 100 mcg by mouth daily before breakfast.    [provider]  lipase/protease/amylase (CREON) 36000 UNITS CPEP capsule Take 36,000 Units by mouth 2 (two) times daily.     [provider]  loperamide (IMODIUM) 2 MG capsule Take 2 mg by mouth as needed for diarrhea or loose stools. Do not exceed 6 doses in 24 hours.    [provider]  losartan (COZAAR) 50 MG tablet Take 50 mg by mouth daily.     [provider]  Multiple Vitamins-Minerals (PRESERVISION AREDS) CAPS Take 1 capsule by mouth 2 (two) times daily.    [provider]  Potassium Chloride Crys ER (KLOR-CON M20 PO) Take 20 mEq by mouth daily.     [provider]  Sennosides 8.6 MG CAPS Take 8.6 mg by mouth every 12 (twelve) hours as needed (constipation).    [provider]  traMADol (ULTRAM) 50 MG tablet Take 50 mg by mouth in the morning and at bedtime.     [provider]  traZODone (DESYREL) 50 MG tablet Take 25 mg by mouth at bedtime.    [provider]    Family History    Family History  Problem Relation Age of Onset   Diabetes Mother    Heart disease Mother    Throat cancer Father    Cirrhosis Son    Alcoholism Son    Ovarian  cancer Daughter    Depression Daughter    Alcoholism Daughter    Colon cancer Neg Hx    He indicated that his mother is deceased. He indicated that his father is deceased. He indicated that two of his three daughters are alive. He indicated that his son is deceased. He indicated that the status of his neg hx is unknown.  Social History    Social History   Socioeconomic History   Marital status: Married    Spouse name: Not on file   Number of children: Not on file   Years of education: Not on file   Highest education level: Not on file  Occupational History   Not on file  Tobacco Use   Smoking status: Former    Packs/day: 0.50    Years: 15.00    Pack years: 7.50    Types: Cigarettes    Quit date: 03/19/1957    Years since quitting: 63.5   Smokeless tobacco: Never   Tobacco comments:    60 years ago as of 2019   Vaping Use   Vaping Use: Never used  Substance and Sexual Activity   Alcohol use: Yes    Comment: 1 drink per day   Drug use: Not Currently   Sexual activity: Not on file  Other Topics Concern   Not on file  Social History Narrative   Diet: Diabetic      Caffeine: Morning Coffee       Married, if yes what year: Yes, 1952      Do you live in a house, apartment, assisted living, condo, trailer, ect: Assisted Living, 1 stories, 2 person       Pets: No       Current/Past profession: Photographer, Glass blower/designer        Exercise: Some, walking       Living Will: Yes   DNR: Yes   POA/HPOA: Yes      Functional Status:   Do you have difficulty bathing or dressing yourself?  No   Do you have difficulty preparing food or eating? No   Do you have difficulty managing your medications? No, sometimes forgets insulin injection   Do you have difficulty managing your finances? Yes   Do you have difficulty affording your medications? Yes   Social Determinants of Health   Financial Resource Strain: Not on file  Food Insecurity: Not on file  Transportation Needs: Not on file  Physical Activity: Not on file  Stress: Not on file  Social Connections: Not on file  Intimate Partner Violence: Not on file     Review of Systems    General:  No chills, fever, night sweats or weight changes.  Cardiovascular:  No chest pain, dyspnea on exertion, edema, orthopnea, palpitations, paroxysmal nocturnal dyspnea. Dermatological: No rash, lesions/masses Respiratory: No cough, dyspnea Urologic: No hematuria, dysuria Abdominal:   No nausea, vomiting, diarrhea, bright red blood per rectum, melena, or hematemesis Neurologic:  No visual changes, wkns, changes in mental status. All other systems reviewed and are otherwise negative except as noted above.  Physical Exam    VS:  BP 108/60 (BP Location: Left Arm)   Pulse 95   Ht 5\' 6"  (1.676 m)   Wt 167 lb (75.8 kg)   SpO2 96%   BMI 26.95 kg/m  , BMI Body mass index is 26.95 kg/m. GEN: Well nourished, well developed, in no acute distress. HEENT: normal. Neck: Supple, no JVD, carotid bruits, or masses. Cardiac: RRR, no  murmurs, rubs, or gallops. No clubbing, cyanosis, generalized nonpitting bilateral lower extremity edema.  Radials/DP/PT 2+ and equal bilaterally.  Respiratory:  Respirations regular and unlabored, clear to auscultation bilaterally. GI: Soft, nontender, nondistended, BS + x 4. MS: no deformity or atrophy. Skin: warm and dry, no rash. Neuro:  Strength and  sensation are intact. Psych: Normal affect.  Accessory Clinical Findings    Recent Labs: 09/23/2019: B Natriuretic Peptide 230.4; Magnesium 2.2; Platelets 105 12/10/2019: BUN 46; Creatinine, Ser 2.60; Hemoglobin 12.9; Potassium 4.7; Sodium 137   Recent Lipid Panel No results found for: CHOL, TRIG, HDL, CHOLHDL, VLDL, LDLCALC, LDLDIRECT  ECG personally reviewed by me today-none today.  EKG 05/31/2020 sinus rhythm first-degree AV block with PVCs/PACs  Echocardiogram 08/25/2019 IMPRESSIONS     1. Global hypokinesis with posterolateral akinesis. Compared with the  echo 10/9167, systolic function has worsened. Left ventricular ejection  fraction, by estimation, is <20%. The left ventricle has severely  decreased function. The left ventricle  demonstrates global hypokinesis. The left ventricular internal cavity size  was mildly dilated. Left ventricular diastolic parameters are consistent  with Grade I diastolic dysfunction (impaired relaxation). Elevated left  ventricular end-diastolic  pressure.   2. Right ventricular systolic function is mildly reduced. The right  ventricular size is normal.   3. The mitral valve is normal in structure. Trivial mitral valve  regurgitation. No evidence of mitral stenosis.   4. The aortic valve is tricuspid. Aortic valve regurgitation is not  visualized. No aortic stenosis is present.   5. Aortic dilatation noted. There is mild dilatation at the level of the  sinuses of Valsalva measuring 39 mm.   6. The inferior vena cava is normal in size with greater than 50%  respiratory variability, suggesting right atrial pressure of 3 mmHg.   Assessment & Plan   1.  Chronic systolic CHF-on room air.  Reports breathing is unchanged.   Is noticing increased activity intolerance.  Echocardiogram 08/25/2019 showed EF less than 20, G1 DD, trivial mitral valve regurgitation, and mild aortic dilation. Continue furosemide, potassium Maintain physical activity Elevate  lower extremities when not active Fluid restriction-as tolerated  Atrial fibrillation-heart rate today 95.  Appears to be paroxysmal in nature.  Previously elected for conservative management.  Increased fall risk makes him a poor candidate for anticoagulation. Continue aspirin Maintain physical activity  Ischemic cardiomyopathy/fatigue-EF now less than 20%.  Reports increased fatigue with daily activities.  Continues to have low blood pressures 100s over 60s. Continue losartan Recommended increased palliative care involvement  Hypertension-BP today 108/60.  Continues to be hypotensive at home Continue to monitor. Lower extremity support stockings Maintain physical activity  Disposition: Follow-up with Dr. Debara Pickett or me as needed  Jossie Ng. Geovana Gebel NP-C    09/21/2020, 12:19 PM [address] [address] Office [phone] Fax [phone]  Notice: This dictation was prepared with Dragon dictation along with smaller phrase technology. Any transcriptional errors that result from this process are unintentional and may not be corrected upon review.  I spent 15 minutes examining this patient, reviewing medications, and using patient centered shared decision making involving her cardiac care.  Prior to her visit I spent greater than 20 minutes reviewing her past medical history,  medications, and prior cardiac tests.

## 2020-09-21 NOTE — Patient Instructions (Addendum)
Medication Instructions:  The current medical regimen is effective;  continue present plan and medications as directed. Please refer to the Current Medication list given to you today.  *If you need a refill on your cardiac medications before your next appointment, please call your pharmacy*  Lab Work:   Testing/Procedures:  NONE    NONE  Follow-Up: Your next appointment:  AS NEEDED In Person with Minus Breeding, MD OR IF UNAVAILABLE [place], FNP-C   At [facility], you and your health needs are our priority.  As part of our continuing mission to provide you with exceptional heart care, we have created designated Provider Care Teams.  These Care Teams include your primary Cardiologist (physician) and Advanced Practice Providers (APPs -  Physician Assistants and Nurse Practitioners) who all work together to provide you with the care you need, when you need it.  We recommend signing up for the patient portal called "MyChart".  Sign up information is provided on this After Visit Summary.  MyChart is used to connect with patients for Virtual Visits (Telemedicine).  Patients are able to view lab/test results, encounter notes, upcoming appointments, etc.  Non-urgent messages can be sent to your provider as well.   To learn more about what you can do with MyChart, go to [email or web address].              6 SALTY THINGS TO AVOID     1,800MG  DAILY

## 2020-09-22 ENCOUNTER — Non-Acute Institutional Stay: Payer: Medicare Other | Admitting: Nurse Practitioner

## 2020-09-22 VITALS — BP 122/70 | HR 71 | Resp 18 | Ht 66.0 in | Wt 174.2 lb

## 2020-09-22 DIAGNOSIS — Z515 Encounter for palliative care: Secondary | ICD-10-CM

## 2020-09-22 DIAGNOSIS — R638 Other symptoms and signs concerning food and fluid intake: Secondary | ICD-10-CM

## 2020-09-22 DIAGNOSIS — R531 Weakness: Secondary | ICD-10-CM

## 2020-09-22 NOTE — Progress Notes (Signed)
[facility] Palliative Care Consult Note Telephone: [phone]  Fax: [phone]    Date of encounter: 09/22/20 PATIENT NAME: Troy Hicks [place] [address]   [phone] (home)  DOB: [birth year] MRN: [id number]  PRIMARY CARE PROVIDER:    [name], NP,  [address] [place] [address] [phone]  REFERRING PROVIDER:   [name], NP [address] [place],  [place] [address] [phone]  RESPONSIBLE PARTY:    Contact Information     Name Relation Home Work Troy Hicks, Troy Hicks Daughter [phone]       I met face to face with patient in the facility.  Palliative Care was asked to follow this patient by consultation request of  [name]* to address advance care planning and complex medical decision making. This is a follow up visit.                                  ASSESSMENT AND PLAN / RECOMMENDATIONS:   Advance Care Planning/Goals of Care:  CODE STATUS: DNR  Patient's goal of care is comfort while preserving function. Signed DNR present on file in the facility, copy on [facility] EMR. Patient reiterated desire to not be resuscitated in the event of cardiac or respiratory arrest. Validation provided.  Symptom Management/Plan: Generalized weakness: Consider physical therapy for strengthening and balance. Maintain safety, encouraged consistent use of assistive device during ambulation to prevent falls. Decreased oral intake: weight is stable, BMI 28.13kg/m2, no edema. Patient report poor oral intake is due to him not liking the facility food. Encourage snacking between meals. Consider consuming nutritional supplements such as Ensure to augment caloric intake. Patient verbalized understanding. CHF:  Echocardiogram 08/25/2019 showed EF <20%. He denied  chest pain, no increased SOB or sign of fluid overload noted. Patient seen by Coletta Memos, NP  (cardiology) yesterday. No change made to medication regimen. Continue current plan of care and follow up with cardiology as scheduled. Provided general support and encouragement.  Visit update discussed with patient daughter Troy Hicks. Questions and concerns were addressed. Beth was encouraged to call with questions and/or concerns.   Follow up Palliative Care Visit: Palliative care will continue to follow for complex medical decision making, advance care planning, and clarification of goals. Return in about 6-8 weeks or prn.  PPS: 50%  HOSPICE ELIGIBILITY/DIAGNOSIS: TBD  CHIEF COMPLAIN: Generalized weakness  History obtained from review of Epic EMR, discussion with facility staff, and interview with Troy Hicks and on phone with his daughter Troy Hicks.  HISTORY OF PRESENT ILLNESS:  Troy Hicks is a 87 y.o. year old male with complaint of generalized weakness. Daughter Troy Hicks expressed concerns that patient appeared weaker saying he did not get out of bed last weak. Generalized weakness associated with poor oral intake. Daughter report condition ongoing and worsened in the last 2 months. Last reported fall was 2 month with ED visit, no injury reported. Fall was said to be related to lower extremities weakness, report legs giving out.  Today, patient was noted sitting in a chair in his room reading a newspaper. He was awake and alert. He report feeling well today, he however report low energy and not been as strong as he used to be. No report of fever, chills, increased SOB, chest pain, or any uncontrolled pain. Ten systems reviewed and are negative for acute change,  except as noted in the HPI.   I reviewed available labs, medications, imaging, studies and related documents from the EMR.  Records reviewed and summarized above.   Vitals with BMI 09/22/2020 09/21/2020 07/19/2020  Height - 5' 6"  -  Weight 174 lbs 3 oz 167 lbs -  BMI 33.61 22.44 -  Systolic 975 300 511  Diastolic 70 60 86  Pulse 71 95 87     Physical Exam: General: frail appearing, cooperative, sitting in chair in NAD EYES: anicteric sclera, no discharge  ENMT: very hard of hearing, oral mucous membranes moist CV: S1S2 normal, no LE edema Pulmonary: LCTA, no increased work of breathing, no cough, room air Abdomen: soft and non tender, no ascites GU: deferred MSK: mild sarcopenia, moves all extremities, ambulatory Skin: warm and dry, no rashes or wounds on visible skin Neuro:  generalized weakness, no cognitive impairment Psych: non-anxious affect, A and O x 3 Hem/lymph/immuno: no widespread bruising  Past Medical History:  Diagnosis Date   Chronic kidney disease (CKD), stage III (moderate) ([facility])    Per New Patient Packet,PSC    Coronary artery disease    Per New Patient Packet,PSC    Edema    Per New Patient Packet,PSC    Gait instability    Per New Patient Packet,PSC    Gout    Per New Patient Packet,PSC    Hypertension    Per New Patient Packet,PSC    Insulin dependent diabetes mellitus    Per New Patient Packet,PSC    Osteoarthritis    Per New Patient [place]    Syncope 2015   Per New Patient Packet,PSC    TIA (transient ischemic attack) 2010   Per New Patient Packet,PSC     Thank you for the opportunity to participate in the care of Troy Hicks.  The palliative care team will continue to follow. Please call our office at [phone] if we can be of additional assistance.   Jari Favre, DNP, AGPCNP-BC  COVID-19 PATIENT SCREENING TOOL Asked and negative response unless otherwise noted:   Have you had symptoms of covid, tested positive or been in contact with someone with symptoms/positive test in the past 5-10 days?

## 2020-10-05 ENCOUNTER — Ambulatory Visit (INDEPENDENT_AMBULATORY_CARE_PROVIDER_SITE_OTHER): Payer: Medicare Other | Admitting: Podiatry

## 2020-10-05 ENCOUNTER — Other Ambulatory Visit: Payer: Self-pay

## 2020-10-05 ENCOUNTER — Encounter: Payer: Self-pay | Admitting: Podiatry

## 2020-10-05 DIAGNOSIS — E1122 Type 2 diabetes mellitus with diabetic chronic kidney disease: Secondary | ICD-10-CM | POA: Diagnosis not present

## 2020-10-05 DIAGNOSIS — B351 Tinea unguium: Secondary | ICD-10-CM

## 2020-10-05 DIAGNOSIS — N183 Chronic kidney disease, stage 3 unspecified: Secondary | ICD-10-CM

## 2020-10-05 DIAGNOSIS — M79675 Pain in left toe(s): Secondary | ICD-10-CM

## 2020-10-05 DIAGNOSIS — Z794 Long term (current) use of insulin: Secondary | ICD-10-CM

## 2020-10-05 DIAGNOSIS — M79674 Pain in right toe(s): Secondary | ICD-10-CM

## 2020-10-05 DIAGNOSIS — L720 Epidermal cyst: Secondary | ICD-10-CM | POA: Insufficient documentation

## 2020-10-08 NOTE — Progress Notes (Signed)
Subjective: Troy Hicks is a pleasant 87 y.o. male patient seen today for diabetic foot care. He has painful thick toenails that are difficult to trim. Pain interferes with ambulation. Aggravating factors include wearing enclosed shoe gear. Pain is relieved with periodic professional debridement.  His daughter is present during today's visit. They note no new pedal problems on today's visit. He did have a fall in May and was seen in ED.  PCP is [name], FNP. Last visit was: last week..  Allergies  Allergen Reactions   Codeine    Other Rash    Objective: Physical Exam  General: Troy Hicks is a pleasant 87 y.o. Caucasian male, WD, WN in NAD. AAO x 3.   Vascular:  Capillary refill time to digits immediate b/l. Faintly palpable DP pulse(s) b/l lower extremities. Faintly palpable PT pulse(s) b/l lower extremities. Pedal hair absent. Lower extremity skin temperature gradient within normal limits. Trace edema noted b/l lower extremities.  Dermatological:  Pedal skin is thin shiny, atrophic b/l lower extremities. No open wounds b/l lower extremities. No interdigital macerations b/l lower extremities. Toenails 1-5 b/l elongated, discolored, dystrophic, thickened, crumbly with subungual debris and tenderness to dorsal palpation.  Musculoskeletal:  Normal muscle strength 5/5 to all lower extremity muscle groups bilaterally. No pain crepitus or joint limitation noted with ROM b/l. Hallux valgus with bunion deformity noted b/l lower extremities.  Neurological:  Protective sensation intact 5/5 intact bilaterally with 10g monofilament b/l. Vibratory sensation intact b/l.  Assessment and Plan:  1. Pain due to onychomycosis of toenails of both feet   2. Type 2 diabetes mellitus with stage 3 chronic kidney disease, with long-term current use of insulin, unspecified whether stage 3a or 3b CKD ([place])      -Examined patient. -Patient to continue soft, supportive shoe gear daily. -Toenails  1-5 b/l were debrided in length and girth with sterile nail nippers and dremel without iatrogenic bleeding.  -Patient to report any pedal injuries to medical professional immediately. -Patient/POA to call should there be question/concern in the interim.  Return in about 3 months (around 01/05/2021).  Marzetta Board, DPM

## 2020-10-26 ENCOUNTER — Non-Acute Institutional Stay: Payer: Medicare Other

## 2020-10-26 ENCOUNTER — Telehealth: Payer: Self-pay

## 2020-10-26 ENCOUNTER — Other Ambulatory Visit: Payer: Self-pay

## 2020-10-26 VITALS — Temp 97.3°F | Wt 171.6 lb

## 2020-10-26 DIAGNOSIS — Z515 Encounter for palliative care: Secondary | ICD-10-CM

## 2020-10-26 NOTE — Telephone Encounter (Signed)
Received message to call nurse JJ @ [facility]. Attempted to reach [facility] and [facility] at [facility] x 2 but unable to leave a message.

## 2020-10-28 ENCOUNTER — Other Ambulatory Visit: Payer: Self-pay

## 2020-10-28 NOTE — Telephone Encounter (Signed)
Spoke with nurse Dewitt Hoes, who shared patient has had c/o dizziness and occasional shortness of breath.Will follow up

## 2020-10-29 NOTE — Progress Notes (Signed)
PATIENT NAME: Troy Hicks DOB: 09-Jun-1919 MRN: [id number]  PRIMARY CARE PROVIDER: [name], FNP  RESPONSIBLE PARTY:  Acct ID - Guarantor Home Phone Work Phone Relationship Acct Type  [id number] RESHAUN, BRISENO [phone]  Self P/F     [address], [place], [place] [address]        PHYSICAL EXAM:   VITALS: Today's Vitals   10/28/20 1447  Temp: (!) 97.3 F (36.3 C)  SpO2: 95%  Weight: 171 lb 9.6 oz (77.8 kg)  PainSc: 0-No pain    TELEHEALTH VISIT STATEMENT Due to the COVID-19 crisis, this visit was done via telemedicine from my office and it was initiated and consent by this patient and at request of facility LPN, [place]. Visit completed with assistance of Palliative care social worker and Med Tech at facility, [place].    JJ reported that patient has been c/o intermittent dizziness and occasional shortness of breath. Virtual check in complete. Skin color wnl. Adin Hector noted that patient c/o dizziness when patient first stands up. Reinforced that someone should be with patient when he is standing up. Encouraged BP to be checked when patient first stands. Shortness of breath occurs during ambulation. Patient remains afebrile, Oxygen saturations and BP have remained wnl.    Adin Hector will request patient to be examined by Clide Deutscher. NP, who is PCP at facility.     Cornelius Moras, RN

## 2020-11-02 NOTE — Progress Notes (Signed)
COMMUNITY PALLIATIVE CARE SW NOTE  PATIENT NAME: Troy Hicks DOB: 1919/08/28 MRN: [id number]  PRIMARY CARE PROVIDER: [name], FNP  RESPONSIBLE PARTY:  Acct ID - Guarantor Home Phone Work Phone Relationship Acct Type  [id number] - Troy Hicks [phone]  Self P/F     [address], [place], [place] [address]     PLAN OF CARE and INTERVENTIONS:             GOALS OF CARE/ ADVANCE CARE PLANNING: Goal is for patient to remain in the facility. Patient is a DNR. SOCIAL/EMOTIONAL/SPIRITUAL ASSESSMENT/ INTERVENTIONS:  SW and RN Building surveyor, virtually) completed a visit with patient at the facility, [facility] Patient was initially laying in bed napping, but easily aroused. When aroused, he sat up on the side of the bed to talk with the team. He is hard of hearing, but engaged. He denied pain. Patient reports that he does have some dizziness when he stands up on his walker. He also report some shortness of breath when he moves around or activity. Patient had an intermittent unproductive cough throughout the visit. The med tech-Samaria also came in patient's room during the visit and she affirmed what patient had reported. She report the issues patient report have been going on for at least 2 days. The facility NP was in the facility yesterday, but was not seen by patient. Patient takes a diuretic 2x's day. Patient's weight has increased throughout the month that the med tech contributes to fluid. On 8/1, patient's weight was 168.2 lbs, during visit today, patient's weight is 171.6 lbs. Patient is alert and oriented x3 and can express his needs. Patient ambulates independently to and from meals and activities. He is also mostly independent of personal care needs. SW reinforced access to palliative care and encouraged the staff to call with any questions, concerns or changes in patient's condition.   PATIENT/CAREGIVER EDUCATION/ COPING:  Patient appears to be in good spirits and is coping  well.  PERSONAL EMERGENCY PLAN:  Per facility protocol.  COMMUNITY RESOURCES COORDINATION/ HEALTH CARE NAVIGATION:  None. FINANCIAL/LEGAL CONCERNS/INTERVENTIONS:  None     SOCIAL HX:  Social History   Tobacco Use   Smoking status: Former    Packs/day: 0.50    Years: 15.00    Pack years: 7.50    Types: Cigarettes    Quit date: 03/19/1957    Years since quitting: 63.6   Smokeless tobacco: Never   Tobacco comments:    60 years ago as of 2019   Substance Use Topics   Alcohol use: Yes    Comment: 1 drink per day    CODE STATUS: DNR ADVANCED DIRECTIVES: Yes MOST FORM COMPLETE:  No HOSPICE EDUCATION PROVIDED: No  PPS: Patient is alert and oriented x3, but hard of hearing. He ambulates independently with a walker. He is mostly independent for ADL's, but occasionally requires assistance from staff.   Duration of visit and documentation: 60 minutes  Katheren Puller, LCSW

## 2021-01-11 ENCOUNTER — Ambulatory Visit: Payer: Medicare Other | Admitting: Podiatry

## 2021-01-30 ENCOUNTER — Ambulatory Visit: Payer: Medicare Other | Admitting: Podiatry

## 2021-02-27 ENCOUNTER — Ambulatory Visit: Payer: Medicare Other | Admitting: Podiatry

## 2021-03-27 ENCOUNTER — Non-Acute Institutional Stay: Payer: Medicare Other | Admitting: *Deleted

## 2021-03-27 DIAGNOSIS — Z515 Encounter for palliative care: Secondary | ICD-10-CM

## 2021-03-27 NOTE — Progress Notes (Signed)
[facility] RN NOTE  PATIENT NAME: Troy Hicks DOB: 10-11-1919 MRN: [id number]  PRIMARY CARE PROVIDER: [name], FNP  RESPONSIBLE PARTY: Troy Hicks (daughter) Acct ID - Guarantor Home Phone Work Phone Relationship Acct Type  [id number] [name], [name] [phone]  Self P/F     [name], [name], [place] [id number]   Due to the COVID-19 crisis, this virtual check-in visit was done via telephone from my office and it was initiated and consent by this patient and or family.  RN telephonic visit completed with patient's daughter Troy Hicks. Patient resides at [facility] assisted [facility] facility. Patient has been having issues with chronic back pain for a very long time due to scoliosis and arthritis. He is 30 yrs old. Daughter states that 6 months ago he was able to travel outside of the facility with his her. He still experienced back pain but was ambulatory using his walker. About 5 months ago he started to decline where he is having significant difficulty walking due to lower back pain. At this point, his only comfortable position is horizontal. He cannot sit, walk or stand without severe pain. He can only get himself to the bathroom which is a very short distance. Daughter says he was started on Oxycodone about a week ago. It was written as a prn order 4x/day and she says he doesn't know to ask for this. She feels that it needs to be scheduled and says he has probably only had about 2 doses since it was ordered. She is also concerned about him falling while taking this medication but doesn't want him in pain either. She is requesting assistance from palliative care. She also wanted to know if a hospital bed would be helpful. I have forwarded this information to his palliative care NP to follow up. Daughter is Patent attorney.   (Duration of visit and documentation 30 minutes)   Troy Eastern, RN BSN

## 2021-03-28 ENCOUNTER — Other Ambulatory Visit: Payer: Self-pay

## 2021-04-05 ENCOUNTER — Ambulatory Visit: Payer: Medicare Other | Admitting: Podiatry

## 2021-04-06 ENCOUNTER — Other Ambulatory Visit: Payer: Self-pay | Admitting: Chiropractic Medicine

## 2021-04-06 DIAGNOSIS — M545 Low back pain, unspecified: Secondary | ICD-10-CM

## 2021-04-06 DIAGNOSIS — M25552 Pain in left hip: Secondary | ICD-10-CM

## 2021-04-18 ENCOUNTER — Other Ambulatory Visit: Payer: Self-pay

## 2021-04-18 ENCOUNTER — Emergency Department (HOSPITAL_COMMUNITY): Payer: Medicare Other

## 2021-04-18 ENCOUNTER — Inpatient Hospital Stay (HOSPITAL_COMMUNITY)
Admission: EM | Admit: 2021-04-18 | Discharge: 2021-04-27 | DRG: 552 | Disposition: A | Discharge status: Skilled Nursing Facility | Payer: Medicare Other | Source: Skilled Nursing Facility | Attending: Internal Medicine | Admitting: Internal Medicine

## 2021-04-18 ENCOUNTER — Encounter (HOSPITAL_COMMUNITY): Payer: Self-pay

## 2021-04-18 DIAGNOSIS — F03918 Unspecified dementia, unspecified severity, with other behavioral disturbance: Secondary | ICD-10-CM | POA: Diagnosis present

## 2021-04-18 DIAGNOSIS — Z818 Family history of other mental and behavioral disorders: Secondary | ICD-10-CM

## 2021-04-18 DIAGNOSIS — E119 Type 2 diabetes mellitus without complications: Secondary | ICD-10-CM

## 2021-04-18 DIAGNOSIS — D72829 Elevated white blood cell count, unspecified: Secondary | ICD-10-CM | POA: Diagnosis not present

## 2021-04-18 DIAGNOSIS — F4321 Adjustment disorder with depressed mood: Secondary | ICD-10-CM | POA: Diagnosis not present

## 2021-04-18 DIAGNOSIS — F0393 Unspecified dementia, unspecified severity, with mood disturbance: Secondary | ICD-10-CM | POA: Diagnosis present

## 2021-04-18 DIAGNOSIS — G8929 Other chronic pain: Secondary | ICD-10-CM | POA: Diagnosis not present

## 2021-04-18 DIAGNOSIS — M5416 Radiculopathy, lumbar region: Secondary | ICD-10-CM | POA: Diagnosis not present

## 2021-04-18 DIAGNOSIS — Z66 Do not resuscitate: Secondary | ICD-10-CM | POA: Diagnosis present

## 2021-04-18 DIAGNOSIS — M109 Gout, unspecified: Secondary | ICD-10-CM | POA: Diagnosis present

## 2021-04-18 DIAGNOSIS — F0390 Unspecified dementia without behavioral disturbance: Secondary | ICD-10-CM | POA: Diagnosis not present

## 2021-04-18 DIAGNOSIS — E1151 Type 2 diabetes mellitus with diabetic peripheral angiopathy without gangrene: Secondary | ICD-10-CM | POA: Diagnosis present

## 2021-04-18 DIAGNOSIS — Z79899 Other long term (current) drug therapy: Secondary | ICD-10-CM

## 2021-04-18 DIAGNOSIS — N184 Chronic kidney disease, stage 4 (severe): Secondary | ICD-10-CM | POA: Diagnosis present

## 2021-04-18 DIAGNOSIS — E039 Hypothyroidism, unspecified: Secondary | ICD-10-CM | POA: Diagnosis present

## 2021-04-18 DIAGNOSIS — E1165 Type 2 diabetes mellitus with hyperglycemia: Secondary | ICD-10-CM | POA: Diagnosis present

## 2021-04-18 DIAGNOSIS — E875 Hyperkalemia: Secondary | ICD-10-CM | POA: Diagnosis present

## 2021-04-18 DIAGNOSIS — E7800 Pure hypercholesterolemia, unspecified: Secondary | ICD-10-CM | POA: Diagnosis present

## 2021-04-18 DIAGNOSIS — G479 Sleep disorder, unspecified: Secondary | ICD-10-CM

## 2021-04-18 DIAGNOSIS — Z885 Allergy status to narcotic agent status: Secondary | ICD-10-CM

## 2021-04-18 DIAGNOSIS — Z8673 Personal history of transient ischemic attack (TIA), and cerebral infarction without residual deficits: Secondary | ICD-10-CM

## 2021-04-18 DIAGNOSIS — I130 Hypertensive heart and chronic kidney disease with heart failure and stage 1 through stage 4 chronic kidney disease, or unspecified chronic kidney disease: Secondary | ICD-10-CM | POA: Diagnosis present

## 2021-04-18 DIAGNOSIS — I5022 Chronic systolic (congestive) heart failure: Secondary | ICD-10-CM | POA: Diagnosis present

## 2021-04-18 DIAGNOSIS — D631 Anemia in chronic kidney disease: Secondary | ICD-10-CM | POA: Diagnosis present

## 2021-04-18 DIAGNOSIS — M47817 Spondylosis without myelopathy or radiculopathy, lumbosacral region: Secondary | ICD-10-CM | POA: Diagnosis present

## 2021-04-18 DIAGNOSIS — M48061 Spinal stenosis, lumbar region without neurogenic claudication: Principal | ICD-10-CM | POA: Diagnosis present

## 2021-04-18 DIAGNOSIS — Z79890 Hormone replacement therapy (postmenopausal): Secondary | ICD-10-CM

## 2021-04-18 DIAGNOSIS — T380X5A Adverse effect of glucocorticoids and synthetic analogues, initial encounter: Secondary | ICD-10-CM | POA: Diagnosis present

## 2021-04-18 DIAGNOSIS — I509 Heart failure, unspecified: Secondary | ICD-10-CM

## 2021-04-18 DIAGNOSIS — Z20822 Contact with and (suspected) exposure to covid-19: Secondary | ICD-10-CM | POA: Diagnosis present

## 2021-04-18 DIAGNOSIS — E1129 Type 2 diabetes mellitus with other diabetic kidney complication: Secondary | ICD-10-CM

## 2021-04-18 DIAGNOSIS — Z833 Family history of diabetes mellitus: Secondary | ICD-10-CM

## 2021-04-18 DIAGNOSIS — E871 Hypo-osmolality and hyponatremia: Secondary | ICD-10-CM | POA: Diagnosis present

## 2021-04-18 DIAGNOSIS — E785 Hyperlipidemia, unspecified: Secondary | ICD-10-CM | POA: Diagnosis present

## 2021-04-18 DIAGNOSIS — Z794 Long term (current) use of insulin: Secondary | ICD-10-CM

## 2021-04-18 DIAGNOSIS — H9190 Unspecified hearing loss, unspecified ear: Secondary | ICD-10-CM | POA: Diagnosis present

## 2021-04-18 DIAGNOSIS — I129 Hypertensive chronic kidney disease with stage 1 through stage 4 chronic kidney disease, or unspecified chronic kidney disease: Secondary | ICD-10-CM

## 2021-04-18 DIAGNOSIS — N183 Chronic kidney disease, stage 3 unspecified: Secondary | ICD-10-CM

## 2021-04-18 DIAGNOSIS — I2510 Atherosclerotic heart disease of native coronary artery without angina pectoris: Secondary | ICD-10-CM

## 2021-04-18 DIAGNOSIS — E876 Hypokalemia: Secondary | ICD-10-CM | POA: Diagnosis present

## 2021-04-18 DIAGNOSIS — F32A Depression, unspecified: Secondary | ICD-10-CM

## 2021-04-18 DIAGNOSIS — D696 Thrombocytopenia, unspecified: Secondary | ICD-10-CM | POA: Diagnosis present

## 2021-04-18 DIAGNOSIS — I10 Essential (primary) hypertension: Secondary | ICD-10-CM | POA: Diagnosis present

## 2021-04-18 DIAGNOSIS — M549 Dorsalgia, unspecified: Secondary | ICD-10-CM | POA: Diagnosis present

## 2021-04-18 DIAGNOSIS — Z95828 Presence of other vascular implants and grafts: Secondary | ICD-10-CM

## 2021-04-18 DIAGNOSIS — N179 Acute kidney failure, unspecified: Secondary | ICD-10-CM | POA: Diagnosis present

## 2021-04-18 DIAGNOSIS — I110 Hypertensive heart disease with heart failure: Secondary | ICD-10-CM

## 2021-04-18 DIAGNOSIS — Z8249 Family history of ischemic heart disease and other diseases of the circulatory system: Secondary | ICD-10-CM

## 2021-04-18 DIAGNOSIS — E1122 Type 2 diabetes mellitus with diabetic chronic kidney disease: Secondary | ICD-10-CM | POA: Diagnosis present

## 2021-04-18 DIAGNOSIS — I5032 Chronic diastolic (congestive) heart failure: Secondary | ICD-10-CM | POA: Diagnosis present

## 2021-04-18 DIAGNOSIS — G4700 Insomnia, unspecified: Secondary | ICD-10-CM | POA: Diagnosis present

## 2021-04-18 DIAGNOSIS — F331 Major depressive disorder, recurrent, moderate: Secondary | ICD-10-CM | POA: Diagnosis present

## 2021-04-18 DIAGNOSIS — E11649 Type 2 diabetes mellitus with hypoglycemia without coma: Secondary | ICD-10-CM | POA: Diagnosis present

## 2021-04-18 DIAGNOSIS — Z7982 Long term (current) use of aspirin: Secondary | ICD-10-CM

## 2021-04-18 DIAGNOSIS — I739 Peripheral vascular disease, unspecified: Secondary | ICD-10-CM | POA: Diagnosis present

## 2021-04-18 DIAGNOSIS — I2581 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: Secondary | ICD-10-CM | POA: Diagnosis present

## 2021-04-18 DIAGNOSIS — Z87891 Personal history of nicotine dependence: Secondary | ICD-10-CM

## 2021-04-18 LAB — CBC WITH DIFFERENTIAL/PLATELET
Abs Immature Granulocytes: 0.07 10*3/uL (ref 0.00–0.07)
Basophils Absolute: 0 10*3/uL (ref 0.0–0.1)
Basophils Relative: 0 %
Eosinophils Absolute: 0.1 10*3/uL (ref 0.0–0.5)
Eosinophils Relative: 0 %
HCT: 37.8 % — ABNORMAL LOW (ref 39.0–52.0)
Hemoglobin: 12.5 g/dL — ABNORMAL LOW (ref 13.0–17.0)
Immature Granulocytes: 1 %
Lymphocytes Relative: 10 %
Lymphs Abs: 1.3 10*3/uL (ref 0.7–4.0)
MCH: 32.7 pg (ref 26.0–34.0)
MCHC: 33.1 g/dL (ref 30.0–36.0)
MCV: 99 fL (ref 80.0–100.0)
Monocytes Absolute: 1.5 10*3/uL — ABNORMAL HIGH (ref 0.1–1.0)
Monocytes Relative: 12 %
Neutro Abs: 9.9 10*3/uL — ABNORMAL HIGH (ref 1.7–7.7)
Neutrophils Relative %: 77 %
Platelets: 104 10*3/uL — ABNORMAL LOW (ref 150–400)
RBC: 3.82 MIL/uL — ABNORMAL LOW (ref 4.22–5.81)
RDW: 14.2 % (ref 11.5–15.5)
WBC: 12.9 10*3/uL — ABNORMAL HIGH (ref 4.0–10.5)
nRBC: 0 % (ref 0.0–0.2)

## 2021-04-18 LAB — COMPREHENSIVE METABOLIC PANEL
ALT: 17 U/L (ref 0–44)
AST: 22 U/L (ref 15–41)
Albumin: 3 g/dL — ABNORMAL LOW (ref 3.5–5.0)
Alkaline Phosphatase: 145 U/L — ABNORMAL HIGH (ref 38–126)
Anion gap: 9 (ref 5–15)
BUN: 40 mg/dL — ABNORMAL HIGH (ref 8–23)
CO2: 25 mmol/L (ref 22–32)
Calcium: 8.7 mg/dL — ABNORMAL LOW (ref 8.9–10.3)
Chloride: 104 mmol/L (ref 98–111)
Creatinine, Ser: 2.04 mg/dL — ABNORMAL HIGH (ref 0.61–1.24)
GFR, Estimated: 29 mL/min — ABNORMAL LOW (ref >60)
Glucose, Bld: 56 mg/dL — ABNORMAL LOW (ref 70–99)
Potassium: 4.5 mmol/L (ref 3.5–5.1)
Sodium: 138 mmol/L (ref 135–145)
Total Bilirubin: 0.8 mg/dL (ref 0.3–1.2)
Total Protein: 6.8 g/dL (ref 6.5–8.1)

## 2021-04-18 LAB — CBG MONITORING, ED
Glucose-Capillary: 45 mg/dL — ABNORMAL LOW (ref 70–99)
Glucose-Capillary: 209 mg/dL — ABNORMAL HIGH (ref 70–99)
Glucose-Capillary: 130 mg/dL — ABNORMAL HIGH (ref 70–99)

## 2021-04-18 LAB — URINALYSIS, ROUTINE W REFLEX MICROSCOPIC
Bilirubin Urine: NEGATIVE
Glucose, UA: NEGATIVE mg/dL
Hgb urine dipstick: NEGATIVE
Ketones, ur: NEGATIVE mg/dL
Leukocytes,Ua: NEGATIVE
Nitrite: NEGATIVE
Protein, ur: NEGATIVE mg/dL
Specific Gravity, Urine: 1.01 (ref 1.005–1.030)
pH: 6 (ref 5.0–8.0)

## 2021-04-18 MED ORDER — PANCRELIPASE (LIP-PROT-AMYL) 12000-38000 UNITS PO CPEP
36000.0000 [IU] | ORAL_CAPSULE | Freq: Three times a day (TID) | ORAL | Status: DC
  Administered 2021-04-19 – 2021-04-27 (×26): 36000 [IU] via ORAL
  Filled 2021-04-18: qty 3
  Filled 2021-04-18: qty 1
  Filled 2021-04-18 (×2): qty 3
  Filled 2021-04-18: qty 1
  Filled 2021-04-18 (×17): qty 3
  Filled 2021-04-18 (×2): qty 1
  Filled 2021-04-18 (×3): qty 3
  Filled 2021-04-18: qty 1
  Filled 2021-04-18: qty 3

## 2021-04-18 MED ORDER — ACETAMINOPHEN 650 MG RE SUPP: 650.0000 mg | Freq: Four times a day (QID) | RECTAL | Status: DC | PRN

## 2021-04-18 MED ORDER — LOSARTAN POTASSIUM 50 MG PO TABS
50.0000 mg | ORAL_TABLET | Freq: Every day | ORAL | Status: DC
  Administered 2021-04-19: 50 mg via ORAL
  Filled 2021-04-18: qty 1

## 2021-04-18 MED ORDER — COLESTIPOL HCL 1 G PO TABS
1.0000 g | ORAL_TABLET | ORAL | Status: DC
  Administered 2021-04-20 – 2021-04-26 (×4): 1 g via ORAL
  Filled 2021-04-18 (×4): qty 1

## 2021-04-18 MED ORDER — MORPHINE SULFATE (PF) 4 MG/ML IV SOLN
4.0000 mg | Freq: Once | INTRAVENOUS | Status: AC
  Administered 2021-04-18: 4 mg via INTRAVENOUS
  Filled 2021-04-18: qty 1

## 2021-04-18 MED ORDER — LEVOTHYROXINE SODIUM 100 MCG PO TABS
100.0000 ug | ORAL_TABLET | Freq: Every day | ORAL | Status: DC
  Administered 2021-04-19 – 2021-04-27 (×9): 100 ug via ORAL
  Filled 2021-04-18 (×9): qty 1

## 2021-04-18 MED ORDER — ACETAMINOPHEN 325 MG PO TABS: 650.0000 mg | ORAL_TABLET | Freq: Four times a day (QID) | ORAL | Status: DC | PRN

## 2021-04-18 MED ORDER — POLYETHYLENE GLYCOL 3350 17 G PO PACK: 17.0000 g | PACK | Freq: Every day | ORAL | Status: DC | PRN

## 2021-04-18 MED ORDER — TRAZODONE HCL 50 MG PO TABS
25.0000 mg | ORAL_TABLET | Freq: Every day | ORAL | Status: DC
  Administered 2021-04-19 – 2021-04-26 (×9): 25 mg via ORAL
  Filled 2021-04-18 (×9): qty 1

## 2021-04-18 MED ORDER — DEXTROSE 50 % IV SOLN
50.0000 mL | Freq: Once | INTRAVENOUS | Status: AC
Start: 2021-04-18 — End: 2021-04-18
  Administered 2021-04-18: 50 mL via INTRAVENOUS
  Filled 2021-04-18: qty 50

## 2021-04-18 MED ORDER — ASPIRIN EC 81 MG PO TBEC
81.0000 mg | DELAYED_RELEASE_TABLET | Freq: Every day | ORAL | Status: DC
  Administered 2021-04-19 – 2021-04-27 (×9): 81 mg via ORAL
  Filled 2021-04-18 (×9): qty 1

## 2021-04-18 MED ORDER — SODIUM CHLORIDE 0.9% FLUSH
3.0000 mL | Freq: Two times a day (BID) | INTRAVENOUS | Status: DC
  Administered 2021-04-18 – 2021-04-27 (×15): 3 mL via INTRAVENOUS

## 2021-04-18 MED ORDER — LIDOCAINE 5 % EX PTCH
1.0000 | MEDICATED_PATCH | CUTANEOUS | Status: DC
  Administered 2021-04-18 – 2021-04-25 (×7): 1 via TRANSDERMAL
  Filled 2021-04-18 (×7): qty 1

## 2021-04-18 MED ORDER — SENNA 8.6 MG PO TABS: 2.0000 | ORAL_TABLET | Freq: Every evening | ORAL | Status: DC | PRN

## 2021-04-18 MED ORDER — LOPERAMIDE HCL 2 MG PO CAPS: 2.0000 mg | ORAL_CAPSULE | ORAL | Status: DC | PRN

## 2021-04-18 MED ORDER — INSULIN ASPART 100 UNIT/ML IJ SOLN
0.0000 [IU] | Freq: Three times a day (TID) | INTRAMUSCULAR | Status: DC
  Administered 2021-04-19 (×2): 3 [IU] via SUBCUTANEOUS
  Administered 2021-04-20 (×2): 8 [IU] via SUBCUTANEOUS
  Administered 2021-04-20 – 2021-04-21 (×2): 11 [IU] via SUBCUTANEOUS

## 2021-04-18 MED ORDER — ESCITALOPRAM OXALATE 10 MG PO TABS
10.0000 mg | ORAL_TABLET | Freq: Every day | ORAL | Status: DC
  Administered 2021-04-19 – 2021-04-27 (×9): 10 mg via ORAL
  Filled 2021-04-18 (×9): qty 1

## 2021-04-18 MED ORDER — FUROSEMIDE 20 MG PO TABS
80.0000 mg | ORAL_TABLET | Freq: Two times a day (BID) | ORAL | Status: DC
  Administered 2021-04-19: 80 mg via ORAL
  Filled 2021-04-18: qty 4

## 2021-04-18 MED ORDER — ENOXAPARIN SODIUM 30 MG/0.3ML IJ SOSY
30.0000 mg | PREFILLED_SYRINGE | Freq: Every day | INTRAMUSCULAR | Status: DC
  Administered 2021-04-19 – 2021-04-22 (×4): 30 mg via SUBCUTANEOUS
  Filled 2021-04-18 (×4): qty 0.3

## 2021-04-18 NOTE — ED Triage Notes (Signed)
Pt arrived to ED via EMS from [facility] via daughter's request for pt's chronic low back pain that is worse in intensity today and pain radiating into bilateral legs. Pt reports he his back pain has radiated into his legs before, but it hasn't happened in a while. VSS w/ EMS. Pt A&Ox4. Pt is very HOH.

## 2021-04-18 NOTE — ED Notes (Signed)
CBG is 45.

## 2021-04-18 NOTE — ED Notes (Signed)
Pt's daughter at bedside and reports L hip pain x 2 months w/ radiation to L leg. Pt's daughter reports pt needs an MRI. Pt's daughter also reports pt losing weight. Pt's daughter says Dr. Nelva Bush said to bring pt here to be admitted. Pt's daughter also requesting nephrology consult. Pt's daughter reports they took him off of his pain meds x 1 week d/t pt lethargic. Pt's daughter reports she can't take pt to doctor appointments d/t inability to get pt in car d/t pain.

## 2021-04-18 NOTE — ED Notes (Signed)
Xray at bedside. Educated pt's daughter of need to step out of room for xray to obtain image to visualize IVC filter so MRI can check compatibility. Pt's daughter is verbally aggressive w/ staff stating, "That is why we came to get the MRI of his lumbar spine and his hip, it was always supposed to be that. I just want to make sure they (xray techs) know what they are supposed to be imaging (referring to the abd xray for IVC filter visualization)." Notified Yao MD.

## 2021-04-18 NOTE — Progress Notes (Signed)
Spoke with Radiologist, Dr. Merilyn Baba. Pt cannot be scanned due to undocumented IVC filter. Must obtain documentation before pt can have MRI. Notified ordering physician.

## 2021-04-18 NOTE — H&P (Signed)
History and Physical   Evertt Chouinard OFB:[id number] DOB: [birth year] DOA: 04/18/2021  PCP: [name], FNP  Patient coming from: Facility  Chief Complaint: Back pain  HPI: Troy Hicks is a 88 y.o. male with medical history significant of diabetes, high thrombocytopenia, hyperlipidemia, peripheral vascular disease, atrial fibrillation, depression, hypothyroidism, CHF, gout, glaucoma, hypertension, dysphagia, dementia, CKD 3 who presents with ongoing back pain.  Patient has had significant back pain chronically and has had difficulty walking secondary to this for the past 6 months.-Has been worsening recently.  Patient has followed with pain management and had been on oxycodone in the past.  However this has been discontinued due to excessive drowsiness and associated risk of falls and injury.  Patient's pain has continued to worsen especially in setting of Reviewed on pain medication for the past several weeks.  Patient states pain was most severe this week.  Pain was significantly worse this morning and patient daughter brought him to the hospital for further evaluation.  He denies diarrhea or incontinence but does state his stools been looser in the past couple of days. He denies fevers, chills, chest pain, shortness of breath, abdominal pain, constipation, diarrhea, nausea, vomiting.  ED Course: All signs in the ED stable.  Lab work-up included CMP which showed BUN 40 and creatinine of 2.0 which is at baseline, glucose initially in the 50s but improved with D50.  Calcium 8.7 which proved consider albumin 3.0, alk phos 145.  CBC with hemoglobin stable 12.5, platelets stable at 104, mild leukocytosis to 12.9.  Urinalysis in the ED negative.  Abdominal film showed sternal wires, mediastinal clips and IVC filter in place.  Nonobstructive bowel gas pattern also noted.  CT the abdomen showed no acute abnormality.  CT of the L-spine showed no acute normality did demonstrate dextroscoliosis and  degenerative disc disease.  Neurosurgery consulted and so there is not much to do except for pain management of his initial evaluation.  MRI have been ordered however this cannot be obtained secondary to patient's above described IVC filter.  Patient pain was unable to be controlled in the ED and patient being admitted for further assistance with pain control.  Review of Systems: As per HPI otherwise all other systems reviewed and are negative.  Past Medical History:  Diagnosis Date   Chronic kidney disease (CKD), stage III (moderate) ([place])    Per [facility]    Coronary artery disease    Per [facility]    Edema    Per [facility]    Gait instability    Per [facility]    Gout    Per [facility]    Hypertension    Per [facility]    Insulin dependent diabetes mellitus    Per [facility]    Osteoarthritis    Per [facility] Patient [place]    Syncope 2015   Per [facility] Patient [facility]    TIA (transient ischemic attack) 2010   Per [facility] [place]     Past Surgical History:  Procedure Laterality Date   CORONARY ARTERY BYPASS GRAFT  1990   Per New Patient [place]     over 25 to 40 years ago   IVC FILTER INSERTION     Per New Patient [place]   Per New Patient [place]    SKIN SURGERY  06/2018   Spot removed from head  TONSILLECTOMY     Per New Patient [place] (childhood)     Social History  reports that he quit smoking about 64 years ago. His smoking use included cigarettes. He has a 7.50 pack-year smoking history. He has never used smokeless tobacco. He reports current alcohol use. He reports that he does not currently use drugs.  Allergies  Allergen Reactions   Codeine    Other Rash    Family History  Problem Relation Age of Onset   Diabetes Mother    Heart disease Mother    Throat cancer Father    Cirrhosis Son     Alcoholism Son    Ovarian cancer Daughter    Depression Daughter    Alcoholism Daughter    Colon cancer Neg Hx   Reviewed on admission  Prior to Admission medications   Medication Sig Start Date End Date Taking? Authorizing Provider  acetaminophen (TYLENOL) 500 MG tablet Take 500 mg by mouth 3 (three) times daily.     [provider]  Alcohol Swabs (ALCOHOL WIPES) 70 % PADS USE FOR INSULIN INJECTIONS OR TO FINGER STICK AS DIRECTED 03/29/20   [provider]  Alogliptin Benzoate 25 MG TABS Take 25 mg by mouth daily.     [provider]  aspirin EC 81 MG tablet Take 81 mg by mouth daily.    [provider]  bacitracin ointment Apply 1 application topically 2 (two) times daily. 07/19/20   Little, Wenda Overland, MD  colchicine 0.6 MG tablet Take 0.6 mg by mouth See admin instructions. Give 1 tablet at the first sign of gout, then give another tablet 2 hours later. Only give every 14 days due to kidney function,    [provider]  colestipol (COLESTID) 1 g tablet Take 1 g by mouth every other day.     [provider]  Dextran 70-Hypromellose, PF, 0.1-0.3 % SOLN Place 1 drop into both eyes 3 (three) times daily.    [provider]  escitalopram (LEXAPRO) 10 MG tablet 1 tablet    [provider]  furosemide (LASIX) 80 MG tablet Take 1 tablet (80 mg total) by mouth 2 (two) times daily. 08/26/19   Hilty, Nadean Corwin, MD  insulin glargine (LANTUS) 100 UNIT/ML injection Inject 35 Units into the skin at bedtime.    [provider]  latanoprost (XALATAN) 0.005 % ophthalmic solution Place 1 drop into both eyes at bedtime. 03/16/20   [provider]  levothyroxine (SYNTHROID, LEVOTHROID) 100 MCG tablet Take 100 mcg by mouth daily before breakfast.    [provider]  lipase/protease/amylase (CREON) 36000 UNITS CPEP capsule See admin instructions.    [provider]  loperamide (IMODIUM) 2 MG capsule Take 2  mg by mouth as needed for diarrhea or loose stools. Do not exceed 6 doses in 24 hours.    [provider]  losartan (COZAAR) 50 MG tablet Take 50 mg by mouth daily.     [provider]  Multiple Vitamins-Minerals (PRESERVISION AREDS 2) CAPS See admin instructions.    [provider]  Potassium Chloride Crys ER (KLOR-CON M20 PO) Take 20 mEq by mouth daily.    [provider]  senna (SENOKOT) 8.6 MG TABS tablet 2 tablets at bedtime as needed    [provider]  traMADol (ULTRAM) 50 MG tablet Take 50 mg by mouth in the morning and at bedtime.     [provider]  traZODone (DESYREL) 50 MG tablet Take 25 mg  by mouth at bedtime.    [provider]  triamcinolone ointment (KENALOG) 0.1 % 1 application 4/53/64   [provider]    Physical Exam: Vitals:   04/18/21 1745 04/18/21 1800 04/18/21 1815 04/18/21 2032  BP: 131/81 112/67 127/79 137/87  Pulse: 66  64 91  Resp: 15 (!) 22 19   Temp:      TempSrc:      SpO2: 97% 98% 98% 96%  Weight:      Height:        Physical Exam Constitutional:      General: He is not in acute distress.    Appearance: Normal appearance.  HENT:     Head: Normocephalic and atraumatic.     Mouth/Throat:     Mouth: Mucous membranes are moist.     Pharynx: Oropharynx is clear.  Eyes:     Extraocular Movements: Extraocular movements intact.     Pupils: Pupils are equal, round, and reactive to light.  Cardiovascular:     Rate and Rhythm: Normal rate and regular rhythm.     Pulses: Normal pulses.     Heart sounds: Normal heart sounds.  Pulmonary:     Effort: Pulmonary effort is normal. No respiratory distress.     Breath sounds: Normal breath sounds.  Abdominal:     General: Bowel sounds are normal. There is no distension.     Palpations: Abdomen is soft.     Tenderness: There is no abdominal tenderness.  Musculoskeletal:        General: Tenderness present. No swelling or deformity.      Comments: Significant back pain with any significant movements.  Skin:    General: Skin is warm and dry.  Neurological:     General: No focal deficit present.     Mental Status: Mental status is at baseline.   Labs on Admission: I have personally reviewed following labs and imaging studies  CBC: Recent Labs  Lab 04/18/21 1653  WBC 12.9*  NEUTROABS 9.9*  HGB 12.5*  HCT 37.8*  MCV 99.0  PLT 104*    Basic Metabolic Panel: Recent Labs  Lab 04/18/21 1653  NA 138  K 4.5  CL 104  CO2 25  GLUCOSE 56*  BUN 40*  CREATININE 2.04*  CALCIUM 8.7*    GFR: Estimated Creatinine Clearance: 19.4 mL/min (A) (by C-G formula based on SCr of 2.04 mg/dL (H)).  Liver Function Tests: Recent Labs  Lab 04/18/21 1653  AST 22  ALT 17  ALKPHOS 145*  BILITOT 0.8  PROT 6.8  ALBUMIN 3.0*    Urine analysis:    Component Value Date/Time   COLORURINE YELLOW 04/18/2021 1628   APPEARANCEUR CLEAR 04/18/2021 1628   LABSPEC 1.010 04/18/2021 1628   PHURINE 6.0 04/18/2021 1628   GLUCOSEU NEGATIVE 04/18/2021 1628   HGBUR NEGATIVE 04/18/2021 1628   BILIRUBINUR NEGATIVE 04/18/2021 1628   KETONESUR NEGATIVE 04/18/2021 1628   PROTEINUR NEGATIVE 04/18/2021 1628   NITRITE NEGATIVE 04/18/2021 1628   LEUKOCYTESUR NEGATIVE 04/18/2021 1628    Radiological Exams on Admission: CT ABDOMEN PELVIS WO CONTRAST  Result Date: 04/18/2021 CLINICAL DATA:  Flank pain, kidney stone suspected. Back pain and left hip pain. EXAM: CT ABDOMEN AND PELVIS WITHOUT CONTRAST TECHNIQUE: Multidetector CT imaging of the abdomen and pelvis was performed following the standard protocol without IV contrast. RADIATION DOSE REDUCTION: This exam was performed according to the departmental dose-optimization program which includes automated exposure control, adjustment of the mA and/or kV according to patient  size and/or use of iterative reconstruction technique. COMPARISON:  None. FINDINGS: Lower chest: The heart is enlarged and there  is no pericardial effusion. Coronary artery calcifications are noted. Bronchiectasis is noted in the lower lobes bilaterally and mild atelectasis is seen at the lung bases. Hepatobiliary: No focal liver abnormality is seen. No gallstones, gallbladder wall thickening, or biliary dilatation. Pancreas: Fatty atrophy is noted. No pancreatic ductal dilatation or surrounding inflammatory changes. Spleen: Normal in size without focal abnormality. Adrenals/Urinary Tract: The adrenal glands are within normal limits. A cyst is present in the mid right kidney measuring 1.7 cm. No renal calculus or hydronephrosis. The bladder is unremarkable. Stomach/Bowel: The stomach is within normal limits. No bowel obstruction, free air, or pneumatosis. Scattered diverticula are present along the colon without evidence of diverticulitis. The appendix is not visualized on exam. Vascular/Lymphatic: Aortic atherosclerotic calcification. Extensive vascular calcifications are noted in the abdomen and pelvis. An IVC filter is noted. No abdominal or pelvic lymphadenopathy. Reproductive: The prostate gland is enlarged. Other: A small fat containing inguinal hernia is noted on the left. Small fat containing umbilical hernia. No ascites. Musculoskeletal: Sternotomy wires are noted over the midline. Degenerative changes are present in the thoracolumbar spine. There is bilateral spondylolysis at L5 with grade 2 anterolisthesis. IMPRESSION: 1. No acute intra-abdominal process. 2. No renal calculus or obstructive uropathy bilaterally. Right renal cyst. 3. Diverticulosis without diverticulitis. Electronically Signed   By: Brett Fairy M.D.   On: 04/18/2021 22:11   CT L-SPINE NO CHARGE  Result Date: 04/18/2021 CLINICAL DATA:  Back pain, left hip pain. EXAM: CT LUMBAR SPINE WITHOUT CONTRAST TECHNIQUE: Multidetector CT imaging of the lumbar spine was performed without intravenous contrast administration. Multiplanar CT image reconstructions were also  generated. RADIATION DOSE REDUCTION: This exam was performed according to the departmental dose-optimization program which includes automated exposure control, adjustment of the mA and/or kV according to patient size and/or use of iterative reconstruction technique. COMPARISON:  04/18/2021. FINDINGS: Segmentation: 5 lumbar type vertebrae. Alignment: There is bilateral spondylolysis at L5 with grade 2 anterolisthesis at L5-S1. Mild retrolisthesis is present at L2-L3. There is dextroscoliosis of the lumbar spine. Vertebrae: No acute fracture. There is partial fusion of the T12, L1, and L2 vertebral bodies. Degenerative changes are present at the sacroiliac joints bilaterally Paraspinal and other soft tissues: No acute abnormality. Disc levels: Vacuum disc phenomena is noted from L2-S1. L1-L2: There is partial fusion of the L1-L2 vertebral bodies. Disc osteophyte complexes and facet arthropathy is noted resulting in moderate spinal canal and mild neural foraminal stenosis bilaterally. L2-L3: There disc osteophyte complexes with facet arthropathy resulting in moderate spinal canal stenosis. There is moderate neural foraminal stenosis on left and mild neural foraminal stenosis on the right. L3-L4: There is a disc osteophyte complex with facet arthropathy resulting in severe spinal canal and left neural foraminal stenosis. The right neural foramen is within normal limits. L4-L5: There is a disc herniation with endplate osteophyte formation and facet arthropathy resulting in severe spinal canal stenosis. Moderate neural foraminal stenosis is present bilaterally. L5-S1: There is a disc herniation with osteophyte formation and facet arthropathy resulting in moderate to severe spinal canal stenosis. Moderate neural foraminal stenosis is present bilaterally. IMPRESSION: 1. No acute fracture. 2. Dextroscoliosis. 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 3. Moderate to severe degenerative changes and  spinal canal stenosis in the lumbar spine. Electronically Signed   By: Brett Fairy M.D.   On: 04/18/2021 22:21   DG  Abd Portable 1 View  Result Date: 04/18/2021 CLINICAL DATA:  MRI clearance. EXAM: PORTABLE ABDOMEN - 1 VIEW COMPARISON:  None. FINDINGS: Sternotomy wires and mediastinal surgical clips are present. IVC filter is present. There are dense calcifications of the splenic artery. Bowel-gas pattern is nonobstructive. There is average stool burden. No suspicious calcifications. There is curvature of the lumbar spine with multilevel degenerative change. IMPRESSION: 1. Sternotomy wires, mediastinal clips and IVC filter present. 2. Nonobstructive bowel gas pattern. Electronically Signed   By: Ronney Asters M.D.   On: 04/18/2021 19:44    EKG: Not performed in the ED.  Assessment/Plan Principal Problem:   Intractable back pain Active Problems:   Thrombocytopenia (HCC)   Adjustment disorder with depressed mood   Chronic pain   Dementia (HCC)   Essential hypertension   Peripheral vascular disease (HCC)   Heart failure (HCC)   Hyperlipidemia   Hypothyroid   Lumbosacral spondylosis without myelopathy   Moderate recurrent major depression (HCC)   Insomnia   Pure hypercholesterolemia   Intractable back pain > Patient with chronic pain that is worsening recently. > Has followed with pain management and treated on opioid pain medication however this discontinued due to excessive drowsiness and risk for injury > Brought in by daughter today as he has had significant pain for some time being unable to walk.  It was significantly worse today. > Pain not able to be controlled despite multiple doses of morphine in the ED. > Neurosurgery consulted in the ED.  And based on additional imaging not much role for them. > CT scan in the ED did not show any acute abnormality to explain patient's worsening pain.  MRI ordered but unable to be obtained due to not passing clearance with metal IVC filter in  place.  Needing further documentation. - Monitor on telemetry, pulse ox - Continue with as needed opioid medications overnight Regarding need to verify MRI compatibility of IVC filter - IVC filter place 25-30 years ago at [facility] in [place] with Angus Seller, DO, Cardiologist, of [facility] in [place].  - There were some records sent to current cardiologist Dr. Debara Pickett, but I cannot see these. - Also note, MRI would need to be feet first, if approved, due to claustrophobia - If approved MRI of L-spine and L-Hip  Leukocytosis > Mild white count at 12.9 in the ED. > Dialysis is normal.  Hold off on checks x-ray for now considering patient's severe pain - Trend CBC  Diabetes > Patient is on some insulin outpatient he is unsure of dose.  As he was hypoglycemic on arrival we will start with SSI alone for now. - SSI  Hypothyroidism - Continue home Synthroid  Hypertension CHF > Last echo in our system in 2021 showed EF less than 20%, G1 DD, RV function mildly reduced - Continue home losartan, Lasix  PVD CAD, hx of CABG Hyperlipidemia - Continue home aspirin and colestipol  Dementia Depression Sleep disturbance - Continue home Lexapro and trazodone  CKD 3 > Creatinine stable at 2 in the ED. - Avoid nephrotoxic agents - Trend renal function and electrolytes  Thrombocytopenia > Chronic, platelet stable 104 - Trend  DVT prophylaxis: Lovenox Code Status:   DNR Family Communication:  Daughter updated by phone. Please keep her up to date with any significant changes. Disposition Plan:   Patient is from:  Facility  Anticipated DC to:  Same as above  Anticipated DC date:  1 to 2 days  Anticipated DC barriers: None, other than intractable pain  Consults called:  Neurosurgery, consulted by EDP, signed off in the ED as far as I can tell. Admission status:  Observation, telemetry  Severity of Illness: The appropriate patient status for this  patient is OBSERVATION. Observation status is judged to be reasonable and necessary in order to provide the required intensity of service to ensure the patient's safety. The patient's presenting symptoms, physical exam findings, and initial radiographic and laboratory data in the context of their medical condition is felt to place them at decreased risk for further clinical deterioration. Furthermore, it is anticipated that the patient will be medically stable for discharge from the hospital within 2 midnights of admission.    Marcelyn Bruins MD [facility]  How to contact the [facility] Attending or Consulting provider [place] or covering provider during after hours [place], for this patient?   Check the care team in [facility] and look for a) attending/consulting [facility] provider listed and b) the [employer] team listed Log into [email or web address] and use [place]'s universal password to access. If you do not have the password, please contact the hospital operator. Locate the [facility] provider you are looking for under [facility] and page to a number that you can be directly reached. If you still have difficulty reaching the provider, please page the [facility] (Director on Call) for the Hospitalists listed on amion for assistance.  04/18/2021, 11:15 PM

## 2021-04-18 NOTE — ED Provider Notes (Addendum)
Troy Hicks   CSN: [id number] Arrival date & time: 04/18/21  1416     History  Chief Complaint  Patient presents with   Chronic Back Pain    Troy Hicks is a 88 y.o. male history of chronic back pain, hypothyroidism, here presenting worsening back pain.  Patient is under pain management.  Patient has been prescribed oxycodone for pain.  However over the last several weeks, his pain got worse.  It radiates down the left leg.  Patient was on oxycodone but was too sedated so was taken off of it.  Patient has been screaming in pain this morning and daughter brought him in for evaluation.  Patient has a history of CKD but has not seen nephrology recently.  The history is provided by the patient.      Home Medications Prior to Admission medications   Medication Sig Start Date End Date Taking? Authorizing Provider  acetaminophen (TYLENOL) 500 MG tablet Take 500 mg by mouth 3 (three) times daily.     [provider]  Alcohol Swabs (ALCOHOL WIPES) 70 % PADS USE FOR INSULIN INJECTIONS OR TO FINGER STICK AS DIRECTED 03/29/20   [provider]  Alogliptin Benzoate 25 MG TABS Take 25 mg by mouth daily.     [provider]  aspirin EC 81 MG tablet Take 81 mg by mouth daily.    [provider]  bacitracin ointment Apply 1 application topically 2 (two) times daily. 07/19/20   Little, Wenda Overland, MD  colchicine 0.6 MG tablet Take 0.6 mg by mouth See admin instructions. Give 1 tablet at the first sign of gout, then give another tablet 2 hours later. Only give every 14 days due to kidney function,    [provider]  colestipol (COLESTID) 1 g tablet Take 1 g by mouth every other day.     [provider]  Dextran 70-Hypromellose, PF, 0.1-0.3 % SOLN Place 1 drop into both eyes 3 (three) times daily.    [provider]  escitalopram (LEXAPRO) 10 MG tablet 1 tablet    [provider]   furosemide (LASIX) 80 MG tablet Take 1 tablet (80 mg total) by mouth 2 (two) times daily. 08/26/19   Hilty, Nadean Corwin, MD  insulin glargine (LANTUS) 100 UNIT/ML injection Inject 35 Units into the skin at bedtime.    [provider]  latanoprost (XALATAN) 0.005 % ophthalmic solution Place 1 drop into both eyes at bedtime. 03/16/20   [provider]  levothyroxine (SYNTHROID, LEVOTHROID) 100 MCG tablet Take 100 mcg by mouth daily before breakfast.    [provider]  lipase/protease/amylase (CREON) 36000 UNITS CPEP capsule See admin instructions.    [provider]  loperamide (IMODIUM) 2 MG capsule Take 2 mg by mouth as needed for diarrhea or loose stools. Do not exceed 6 doses in 24 hours.    [provider]  losartan (COZAAR) 50 MG tablet Take 50 mg by mouth daily.     [provider]  Multiple Vitamins-Minerals (PRESERVISION AREDS 2) CAPS See admin instructions.    [provider]  Potassium Chloride Crys ER (KLOR-CON M20 PO) Take 20 mEq by mouth daily.    [provider]  senna (SENOKOT) 8.6 MG TABS tablet 2 tablets at bedtime as needed    [provider]  traMADol (ULTRAM) 50 MG tablet Take 50 mg by mouth in the morning and at bedtime.     [provider]  traZODone (DESYREL) 50 MG tablet Take 25 mg by mouth at bedtime.    [provider]  triamcinolone ointment (KENALOG) 0.1 % 1 application 2/97/98   [provider]      Allergies    Codeine and Other    Review of Systems   Review of Systems  Musculoskeletal:  Positive for back pain.  All other systems reviewed and are negative.  Physical Exam Updated Vital Signs BP 125/67    Pulse 77    Temp 98.3 F (36.8 C) (Oral)    Resp 15    Ht 5\' 6"  (1.676 m)    Wt 77.8 kg    SpO2 95%    BMI 27.68 kg/m  Physical Exam Vitals and nursing Hicks reviewed.  Constitutional:      Comments: Chronically ill, uncomfortable  HENT:     Head:  Normocephalic.     Nose: Nose normal.     Mouth/Throat:     Mouth: Mucous membranes are dry.  Eyes:     Extraocular Movements: Extraocular movements intact.     Pupils: Pupils are equal, round, and reactive to light.  Cardiovascular:     Rate and Rhythm: Normal rate and regular rhythm.     Pulses: Normal pulses.  Pulmonary:     Effort: Pulmonary effort is normal.     Breath sounds: Normal breath sounds.  Abdominal:     General: Abdomen is flat.     Palpations: Abdomen is soft.  Musculoskeletal:     Cervical back: Normal range of motion and neck supple.     Comments: Mild left lower paralumbar tenderness.  Patient has decreased range of motion of the left hip but no obvious deformity.  Patient has positive straight leg raise on the left  Skin:    General: Skin is warm.     Capillary Refill: Capillary refill takes less than 2 seconds.  Neurological:     General: No focal deficit present.     Mental Status: He is oriented to person, place, and time.     Comments: Patient has no saddle anesthesia.  Patient has normal reflexes bilateral knees.  Psychiatric:        Mood and Affect: Mood normal.        Behavior: Behavior normal.    ED Results / Procedures / Treatments   Labs (all labs ordered are listed, but only abnormal results are displayed) Labs Reviewed  CBC WITH DIFFERENTIAL/PLATELET  COMPREHENSIVE METABOLIC PANEL  URINALYSIS, ROUTINE W REFLEX MICROSCOPIC    EKG None  Radiology No results found.  Procedures Procedures    Medications Ordered in ED Medications  morphine 4 MG/ML injection 4 mg (has no administration in time range)    ED Course/ Medical Decision Making/ A&P                           Medical Decision Making Troy Hicks is a 88 y.o. male history of CKD here presenting with back pain and left hip pain.  I think likely sciatica.  Patient has history of scoliosis and CKD.  We will get MRI to further assess.  We will also check CBC and BMP and  urinalysis.  Abdomen nontender and I doubt ruptured AAA    7pm Unfortunately, patient has IVC filter. Unable to get MRI. Ordered CT ab/pel and CT lumbar spine. On his chemistry, glucose was 45, given juice and D50 and glucose went up to 209  10:35 PM Repeat glucose is 130.  I reviewed CT abdomen pelvis and CT lumbar spine.  CT showed anterior listhesis L5-S1 and severe degenerative changes.  Patient is still in pain whenever he moves.  He was given multiple doses of pain medicine.  At this point, will admit for pain control. Talked to Dr. Kathyrn Sheriff, who doesn't recommend any neurosurgery intervention    Problems Addressed: Lumbar radiculopathy: chronic illness or injury with exacerbation, progression, or side effects of treatment  Amount and/or Complexity of Data Reviewed Independent Historian: caregiver External Data Reviewed: notes. Labs: ordered. Radiology: ordered and independent interpretation performed. Decision-making details documented in ED Course.  Risk Prescription drug management. Decision regarding hospitalization.  Final Clinical Impression(s) / ED Diagnoses Final diagnoses:  None    Rx / DC Orders ED Discharge Orders     None         Drenda Freeze, MD 04/18/21 2255    Drenda Freeze, MD 04/18/21 2256

## 2021-04-18 NOTE — ED Notes (Signed)
Tolerate oral fluid intake well

## 2021-04-19 DIAGNOSIS — N1832 Chronic kidney disease, stage 3b: Secondary | ICD-10-CM | POA: Diagnosis not present

## 2021-04-19 DIAGNOSIS — D696 Thrombocytopenia, unspecified: Secondary | ICD-10-CM | POA: Diagnosis not present

## 2021-04-19 DIAGNOSIS — E039 Hypothyroidism, unspecified: Secondary | ICD-10-CM

## 2021-04-19 DIAGNOSIS — N183 Chronic kidney disease, stage 3 unspecified: Secondary | ICD-10-CM | POA: Diagnosis present

## 2021-04-19 DIAGNOSIS — F411 Generalized anxiety disorder: Secondary | ICD-10-CM | POA: Insufficient documentation

## 2021-04-19 DIAGNOSIS — M549 Dorsalgia, unspecified: Secondary | ICD-10-CM | POA: Diagnosis not present

## 2021-04-19 DIAGNOSIS — F3340 Major depressive disorder, recurrent, in remission, unspecified: Secondary | ICD-10-CM | POA: Insufficient documentation

## 2021-04-19 LAB — CBC
HCT: 38 % — ABNORMAL LOW (ref 39.0–52.0)
Hemoglobin: 12.5 g/dL — ABNORMAL LOW (ref 13.0–17.0)
MCH: 32.9 pg (ref 26.0–34.0)
MCHC: 32.9 g/dL (ref 30.0–36.0)
MCV: 100 fL (ref 80.0–100.0)
Platelets: 102 10*3/uL — ABNORMAL LOW (ref 150–400)
RBC: 3.8 MIL/uL — ABNORMAL LOW (ref 4.22–5.81)
RDW: 14.3 % (ref 11.5–15.5)
WBC: 12.6 10*3/uL — ABNORMAL HIGH (ref 4.0–10.5)
nRBC: 0 % (ref 0.0–0.2)

## 2021-04-19 LAB — COMPREHENSIVE METABOLIC PANEL
ALT: 15 U/L (ref 0–44)
AST: 19 U/L (ref 15–41)
Albumin: 2.7 g/dL — ABNORMAL LOW (ref 3.5–5.0)
Alkaline Phosphatase: 131 U/L — ABNORMAL HIGH (ref 38–126)
Anion gap: 11 (ref 5–15)
BUN: 40 mg/dL — ABNORMAL HIGH (ref 8–23)
CO2: 22 mmol/L (ref 22–32)
Calcium: 8.5 mg/dL — ABNORMAL LOW (ref 8.9–10.3)
Chloride: 105 mmol/L (ref 98–111)
Creatinine, Ser: 1.9 mg/dL — ABNORMAL HIGH (ref 0.61–1.24)
GFR, Estimated: 31 mL/min — ABNORMAL LOW (ref >60)
Glucose, Bld: 94 mg/dL (ref 70–99)
Potassium: 4.3 mmol/L (ref 3.5–5.1)
Sodium: 138 mmol/L (ref 135–145)
Total Bilirubin: 0.7 mg/dL (ref 0.3–1.2)
Total Protein: 6.3 g/dL — ABNORMAL LOW (ref 6.5–8.1)

## 2021-04-19 LAB — RESP PANEL BY RT-PCR (FLU A&B, COVID) ARPGX2
Influenza A by PCR: NEGATIVE
Influenza B by PCR: NEGATIVE
SARS Coronavirus 2 by RT PCR: NEGATIVE

## 2021-04-19 LAB — GLUCOSE, CAPILLARY: Glucose-Capillary: 211 mg/dL — ABNORMAL HIGH (ref 70–99)

## 2021-04-19 LAB — CBG MONITORING, ED
Glucose-Capillary: 81 mg/dL (ref 70–99)
Glucose-Capillary: 191 mg/dL — ABNORMAL HIGH (ref 70–99)
Glucose-Capillary: 186 mg/dL — ABNORMAL HIGH (ref 70–99)

## 2021-04-19 LAB — HEMOGLOBIN A1C
Hgb A1c MFr Bld: 6.6 % — ABNORMAL HIGH (ref 4.8–5.6)
Mean Plasma Glucose: 142.72 mg/dL

## 2021-04-19 MED ORDER — HYDROCODONE-ACETAMINOPHEN 5-325 MG PO TABS
1.0000 | ORAL_TABLET | Freq: Four times a day (QID) | ORAL | Status: DC | PRN
  Administered 2021-04-24: 1 via ORAL
  Filled 2021-04-19 (×2): qty 1

## 2021-04-19 MED ORDER — POLYETHYLENE GLYCOL 3350 17 G PO PACK
17.0000 g | PACK | Freq: Every day | ORAL | Status: DC
  Administered 2021-04-20 – 2021-04-27 (×8): 17 g via ORAL
  Filled 2021-04-19 (×8): qty 1

## 2021-04-19 MED ORDER — METHYLPREDNISOLONE SODIUM SUCC 40 MG IJ SOLR
40.0000 mg | Freq: Two times a day (BID) | INTRAMUSCULAR | Status: DC
  Administered 2021-04-19 – 2021-04-23 (×8): 40 mg via INTRAVENOUS
  Filled 2021-04-19 (×8): qty 1

## 2021-04-19 MED ORDER — FUROSEMIDE 40 MG PO TABS
80.0000 mg | ORAL_TABLET | Freq: Every day | ORAL | Status: DC
  Administered 2021-04-20: 80 mg via ORAL
  Filled 2021-04-19: qty 2

## 2021-04-19 MED ORDER — HYDROMORPHONE HCL 1 MG/ML IJ SOLN
0.5000 mg | INTRAMUSCULAR | Status: DC | PRN
  Administered 2021-04-19: 0.5 mg via INTRAVENOUS
  Filled 2021-04-19: qty 1

## 2021-04-19 NOTE — Assessment & Plan Note (Addendum)
Patient with longstanding history of chronic back pain.  Has been on opioid pain medications.  Follows with pain management clinic.  Was brought in because of significant worsening in the pain.  No history of any falls or injuries recently.  No neurological deficits noted on examination.   MRI was initially ordered but could not be done since he has metal IVC filter.  This filter was apparently placed in outside facility more than 20 years ago.   I did try to call his cardiologist in [place] but have not heard back.  I also called [facility] in [place] where apparently the filter was placed and the radiology department does not have any records of the type of IVC that was placed.  So with this limited information we will not be able to do MRI in this patient.  Patient subsequently underwent CT of the lumbar spine which does suggest a significant spinal stenosis in the lumbar spine.  Findings were discussed with neurosurgery.   Pain likely from significant spinal stenosis and lumbar area.  No role for any operative intervention currently.   After discussions with his daughter and since likelihood of infection is low patient was started on steroids.  Patient's pain has significantly improved compared to initial presentation.  He is more comfortable sitting upright.  Not constantly complaining of pain.  Mobility appears to have improved as well.  Continue PT and OT.  Short-term rehab was recommended.  Changed over to oral steroids.  We will plan for a slow taper.  Can follow-up with neurosurgery in the next few weeks depending on pain and mobility.

## 2021-04-19 NOTE — Assessment & Plan Note (Signed)
Stable  Continue home medication  

## 2021-04-19 NOTE — Assessment & Plan Note (Signed)
Stable

## 2021-04-19 NOTE — Assessment & Plan Note (Addendum)
Continue levothyroxine 100 mcg daily as at home.

## 2021-04-19 NOTE — Assessment & Plan Note (Addendum)
HbA1c 6.6.  Prior to admission he was on Lantus.  It was held initially due to low glucose levels presumably from poor oral intake. Subsequently noted to have hyperglycemia from steroids.  Started back on Lantus.  Also placed on SSI and meal coverage. Noted to have hypoglycemia yesterday morning.  Insulin dose was adjusted.  Continue current dose for now.  CBG stable.

## 2021-04-19 NOTE — ED Notes (Signed)
Hospitalist at bedside.  Pt was able to actively move arms and legs w/o extreme pain.

## 2021-04-19 NOTE — Assessment & Plan Note (Deleted)
Acute Kidney Injury Hyperkalemia Hyponatremia  Baseline renal function noted to be between 1.8-2.1.  Occasional higher readings noted.  Creatinine noted to be significantly higher today compared yesterday with a rising BUN.  Likely from poor oral intake.  We will discontinue his Lasix.  Dose was previously decreased.  We will add IV fluids.  Monitor urine output. Potassium noted to be mildly elevated.  Possibly from steroids.  Recheck tomorrow Noted to have low sodium levels likely from poor oral intake.  We will put him on IV fluids.

## 2021-04-19 NOTE — ED Notes (Signed)
Pt refused meal tray, but given orange juice per request.  Head of bed elevated for comfort and safety w/ drinking.  Orange juice placed within reach.

## 2021-04-19 NOTE — ED Notes (Signed)
This writer asked the Pt if he would like something for pain and he refused.  Will reassess later.

## 2021-04-19 NOTE — Progress Notes (Signed)
Inpatient Diabetes Program Recommendations  AACE/ADA: New Consensus Statement on Inpatient Glycemic Control (2015)  Target Ranges:  Prepandial:   less than 140 mg/dL      Peak postprandial:   less than 180 mg/dL (1-2 hours)      Critically ill patients:  140 - 180 mg/dL   Lab Results  Component Value Date   GLUCAP 81 04/19/2021   HGBA1C 6.6 (H) 04/19/2021    Review of Glycemic Control  Latest Reference Range & Units 04/18/21 18:17 04/18/21 18:30 04/18/21 21:02 04/19/21 07:40  Glucose-Capillary 70 - 99 mg/dL 45 (L) 209 (H) 130 (H) 81  (L): Data is abnormally low (H): Data is abnormally high  Diabetes history: DM2 Outpatient Diabetes medications: Lantus 35 units qd Current orders for Inpatient glycemic control: Novolog correction 0-15 units tid  Inpatient Diabetes Program Recommendations:   Noted A1c 6.6. Please consider decrease in home insulin Lantus for discharge. -Decrease Novolog correction to 0-9 units tid.  Thank you, Troy Hicks. Kerianne Gurr, RN, MSN, CDE  Diabetes Coordinator Inpatient Glycemic Control Team Team Pager [phone] (8am-5pm) 04/19/2021 11:13 AM

## 2021-04-19 NOTE — ED Notes (Signed)
Pt continues to refuse breakfast tray.  Assisted w/ drinking orange juice and water.

## 2021-04-19 NOTE — Progress Notes (Signed)
[facility] PROGRESS NOTE   Troy Hicks MGQ:[id number] DOB: 02-08-1920 DOA: 04/18/2021  0 DOS: the patient was seen and examined on 04/19/2021  PCP: [name], FNP  Brief History and Hospital Course:  88 y.o. male with medical history significant of diabetes, high thrombocytopenia, hyperlipidemia, peripheral vascular disease, atrial fibrillation, depression, hypothyroidism, CHF, gout, glaucoma, hypertension, dysphagia, dementia, CKD 3 who presented with ongoing back pain.  Patient has had chronic back pain for a very long time and over the last 6 months has had difficulty walking.  Follows with pain management.  Pain was thought to be particularly worse this past week.  Daughter discussed with patient's primary care provider who recommended bringing the patient to the emergency department.  MRI was considered however patient has IVC filter in place and as a result MRI could not be done since the filter material could not be verified.  Apparently this filter was placed about 25 to 30 years ago at outside facility.  Patient was hospitalized for pain management.  Will involve PT and OT.  Consultants: Phone discussion with neurosurgery by admitting team/EDP  Procedures: None    Subjective: Patient mentions that he has severe back pain.  Able to move his legs.  Denies any chest pain or shortness of breath.    Assessment/Plan:   * Intractable back pain- (present on admission) Patient with longstanding history of chronic back pain.  Has been on opioid pain medications.  Follows with pain management clinic.  Was brought in because of significant worsening in the pain.  No history of any falls or injuries recently.  No neurological deficits noted on examination.  MRI was initially ordered but could not be done since he has mental IVC filter.  This filter was apparently placed in outside facility more than 20 years ago.  Patient subsequently underwent CT of the lumbar spine.  Findings  were discussed with neurosurgery.  We will continue with aggressive pain management.  Use lidocaine patch.  PT and OT evaluation. Palliative Care consultation.  Hyperglycemia due to type 2 diabetes mellitus ([place])- (present on admission) Monitor CBGs.  Continue SSI.  Hypothyroid- (present on admission) Continue levothyroxine.  Essential hypertension- (present on admission) Noted to be on furosemide as well as losartan.  Being continued.  Will hold losartan for now.  Monitor blood pressures.  Moderate recurrent major depression ([place])- (present on admission) Noted to be on Lexapro and trazodone.  Dementia ([place])- (present on admission) Stable.  Continue home medication  CKD (chronic kidney disease), stage III ([place])- (present on admission) Baseline renal function noted to be between 1.8-2.1.  Occasional higher readings noted.  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      DVT Prophylaxis: SCDs Code Status: DNR Family Communication: Discussed with patient.  No family at bedside. Disposition Plan: To be determined  Status is: Observation The patient will require care spanning > 2 midnights and should be moved to inpatient because: Intractable back pain  Planned Discharge Destination: Home with Home Health          Medications: Scheduled:  aspirin EC  81 mg Oral Daily   [START ON 04/20/2021] colestipol  1 g Oral QODAY   enoxaparin (LOVENOX) injection  30 mg Subcutaneous  Daily   escitalopram  10 mg Oral Daily   furosemide  80 mg Oral BID   insulin aspart  0-15 Units Subcutaneous TID WC   levothyroxine  100 mcg Oral QAC breakfast   lidocaine  1 patch  Transdermal Q24H   lipase/protease/amylase  36,000 Units Oral TID AC   losartan  50 mg Oral Daily   sodium chloride flush  3 mL Intravenous Q12H   traZODone  25 mg Oral QHS   Continuous: PJA:[id number] **OR** acetaminophen, HYDROcodone-acetaminophen, HYDROmorphone (DILAUDID) injection, loperamide, polyethylene glycol, senna  Antibiotics: Anti-infectives (From admission, onward)    None       Objective:  Vital Signs  Vitals:   04/19/21 0700 04/19/21 0800 04/19/21 0900 04/19/21 1000  BP: 120/76 128/85 (!) 144/86 (!) 147/68  Pulse: 85 87 91 94  Resp: 16 16 14 19   Temp:      TempSrc:      SpO2: 97% 96% 97% 98%  Weight:      Height:       No intake or output data in the 24 hours ending 04/19/21 1054 Filed Weights   04/18/21 1435  Weight: 77.8 kg    General appearance: Awake alert.  In no distress.  Hard of hearing.  Distracted Resp: Clear to auscultation bilaterally.  Normal effort Cardio: S1-S2 is normal regular.  No S3-S4.  No rubs murmurs or bruit GI: Abdomen is soft.  Nontender nondistended.  Bowel sounds are present normal.  No masses organomegaly Extremities: No edema.  Able to lift both his legs off the bed. Neurologic:  No focal neurological deficits.    Lab Results:  Data Reviewed: I have personally reviewed labs and imaging study reports  CBC: Recent Labs  Lab 04/18/21 1653 04/19/21 0435  WBC 12.9* 12.6*  NEUTROABS 9.9*  --   HGB 12.5* 12.5*  HCT 37.8* 38.0*  MCV 99.0 100.0  PLT 104* 102*    Basic Metabolic Panel: Recent Labs  Lab 04/18/21 1653 04/19/21 0435  NA 138 138  K 4.5 4.3  CL 104 105  CO2 25 22  GLUCOSE 56* 94  BUN 40* 40*  CREATININE 2.04* 1.90*  CALCIUM 8.7* 8.5*    GFR: Estimated Creatinine Clearance: 20.8 mL/min (A) (by C-G formula based on SCr of 1.9 mg/dL (H)).  Liver Function Tests: Recent Labs  Lab 04/18/21 1653 04/19/21 0435  AST 22 19  ALT 17 15  ALKPHOS 145* 131*  BILITOT 0.8 0.7  PROT 6.8 6.3*   ALBUMIN 3.0* 2.7*      HbA1C: Recent Labs    04/19/21 0435  HGBA1C 6.6*    CBG: Recent Labs  Lab 04/18/21 1817 04/18/21 1830 04/18/21 2102 04/19/21 0740  GLUCAP 45* 209* 130* 81      Radiology Studies: CT ABDOMEN PELVIS WO CONTRAST  Result Date: 04/18/2021 CLINICAL DATA:  Flank pain, kidney stone suspected. Back pain and left hip pain. EXAM: CT ABDOMEN AND PELVIS WITHOUT CONTRAST TECHNIQUE: Multidetector CT imaging of the abdomen and pelvis was performed following the standard protocol without IV contrast. RADIATION DOSE REDUCTION: This exam was performed according to the departmental dose-optimization program which includes automated exposure control, adjustment of the mA and/or kV according to patient size and/or use of iterative reconstruction technique. COMPARISON:  None. FINDINGS: Lower chest: The heart is enlarged and there is no pericardial effusion. Coronary artery calcifications are noted. Bronchiectasis is noted in the lower lobes bilaterally and mild atelectasis is seen at the lung bases. Hepatobiliary: No focal liver  abnormality is seen. No gallstones, gallbladder wall thickening, or biliary dilatation. Pancreas: Fatty atrophy is noted. No pancreatic ductal dilatation or surrounding inflammatory changes. Spleen: Normal in size without focal abnormality. Adrenals/Urinary Tract: The adrenal glands are within normal limits. A cyst is present in the mid right kidney measuring 1.7 cm. No renal calculus or hydronephrosis. The bladder is unremarkable. Stomach/Bowel: The stomach is within normal limits. No bowel obstruction, free air, or pneumatosis. Scattered diverticula are present along the colon without evidence of diverticulitis. The appendix is not visualized on exam. Vascular/Lymphatic: Aortic atherosclerotic calcification. Extensive vascular calcifications are noted in the abdomen and pelvis. An IVC filter is noted. No abdominal or pelvic lymphadenopathy. Reproductive: The  prostate gland is enlarged. Other: A small fat containing inguinal hernia is noted on the left. Small fat containing umbilical hernia. No ascites. Musculoskeletal: Sternotomy wires are noted over the midline. Degenerative changes are present in the thoracolumbar spine. There is bilateral spondylolysis at L5 with grade 2 anterolisthesis. IMPRESSION: 1. No acute intra-abdominal process. 2. No renal calculus or obstructive uropathy bilaterally. Right renal cyst. 3. Diverticulosis without diverticulitis. Electronically Signed   By: Brett Fairy M.D.   On: 04/18/2021 22:11   CT L-SPINE NO CHARGE  Result Date: 04/18/2021 CLINICAL DATA:  Back pain, left hip pain. EXAM: CT LUMBAR SPINE WITHOUT CONTRAST TECHNIQUE: Multidetector CT imaging of the lumbar spine was performed without intravenous contrast administration. Multiplanar CT image reconstructions were also generated. RADIATION DOSE REDUCTION: This exam was performed according to the departmental dose-optimization program which includes automated exposure control, adjustment of the mA and/or kV according to patient size and/or use of iterative reconstruction technique. COMPARISON:  04/18/2021. FINDINGS: Segmentation: 5 lumbar type vertebrae. Alignment: There is bilateral spondylolysis at L5 with grade 2 anterolisthesis at L5-S1. Mild retrolisthesis is present at L2-L3. There is dextroscoliosis of the lumbar spine. Vertebrae: No acute fracture. There is partial fusion of the T12, L1, and L2 vertebral bodies. Degenerative changes are present at the sacroiliac joints bilaterally Paraspinal and other soft tissues: No acute abnormality. Disc levels: Vacuum disc phenomena is noted from L2-S1. L1-L2: There is partial fusion of the L1-L2 vertebral bodies. Disc osteophyte complexes and facet arthropathy is noted resulting in moderate spinal canal and mild neural foraminal stenosis bilaterally. L2-L3: There disc osteophyte complexes with facet arthropathy resulting in  moderate spinal canal stenosis. There is moderate neural foraminal stenosis on left and mild neural foraminal stenosis on the right. L3-L4: There is a disc osteophyte complex with facet arthropathy resulting in severe spinal canal and left neural foraminal stenosis. The right neural foramen is within normal limits. L4-L5: There is a disc herniation with endplate osteophyte formation and facet arthropathy resulting in severe spinal canal stenosis. Moderate neural foraminal stenosis is present bilaterally. L5-S1: There is a disc herniation with osteophyte formation and facet arthropathy resulting in moderate to severe spinal canal stenosis. Moderate neural foraminal stenosis is present bilaterally. IMPRESSION: 1. No acute fracture. 2. Dextroscoliosis. 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 3. Moderate to severe degenerative changes and spinal canal stenosis in the lumbar spine. Electronically Signed   By: Brett Fairy M.D.   On: 04/18/2021 22:21   DG Abd Portable 1 View  Result Date: 04/18/2021 CLINICAL DATA:  MRI clearance. EXAM: PORTABLE ABDOMEN - 1 VIEW COMPARISON:  None. FINDINGS: Sternotomy wires and mediastinal surgical clips are present. IVC filter is present. There are dense calcifications of the splenic artery. Bowel-gas pattern is nonobstructive. There is average  stool burden. No suspicious calcifications. There is curvature of the lumbar spine with multilevel degenerative change. IMPRESSION: 1. Sternotomy wires, mediastinal clips and IVC filter present. 2. Nonobstructive bowel gas pattern. Electronically Signed   By: Ronney Asters M.D.   On: 04/18/2021 19:44       LOS: 0 days   [facility] Pager on [email or web address]  04/19/2021, 10:54 AM

## 2021-04-19 NOTE — Assessment & Plan Note (Addendum)
Patient daughter informed me that he is no longer on losartan at home.  This has been discontinued. Blood pressure stable for the most part.

## 2021-04-19 NOTE — Assessment & Plan Note (Signed)
Noted to be on Lexapro and trazodone.

## 2021-04-19 NOTE — Assessment & Plan Note (Addendum)
Leukocytosis Normocytic anemia  Stable for the most part. Platelet counts have improved.  Lovenox was held a few days ago.  Remains on SCDs.

## 2021-04-19 NOTE — ED Notes (Signed)
I called to 3e the report I received was that someone is coming to clean the room

## 2021-04-19 NOTE — Hospital Course (Addendum)
88 y.o. male with medical history significant of diabetes, high thrombocytopenia, hyperlipidemia, peripheral vascular disease, atrial fibrillation, depression, hypothyroidism, CHF, gout, glaucoma, hypertension, dysphagia, dementia, CKD 3 who presented with ongoing back pain.  Patient has had chronic back pain for a very long time and over the last 6 months has had difficulty walking.  Follows with pain management.  Pain was thought to be particularly worse this past week.  Daughter discussed with patient's primary care provider who recommended bringing the patient to the emergency department.  MRI was considered however patient has IVC filter in place and as a result MRI could not be done since the filter material could not be verified.  Apparently this filter was placed about 25 to 30 years ago at outside facility.  Patient was hospitalized for pain management.  Started on steroids.  Back pain has improved.  Subsequently developed hyponatremia which was initially thought to be due to hypovolemia for which she was given IV fluids.  Sodium started improving.  Now appears to be slightly volume overloaded.  Will be given Lasix today.

## 2021-04-20 ENCOUNTER — Other Ambulatory Visit: Payer: Medicare Other

## 2021-04-20 DIAGNOSIS — F331 Major depressive disorder, recurrent, moderate: Secondary | ICD-10-CM | POA: Diagnosis present

## 2021-04-20 DIAGNOSIS — I10 Essential (primary) hypertension: Secondary | ICD-10-CM | POA: Diagnosis not present

## 2021-04-20 DIAGNOSIS — M5416 Radiculopathy, lumbar region: Secondary | ICD-10-CM | POA: Diagnosis present

## 2021-04-20 DIAGNOSIS — I130 Hypertensive heart and chronic kidney disease with heart failure and stage 1 through stage 4 chronic kidney disease, or unspecified chronic kidney disease: Secondary | ICD-10-CM | POA: Diagnosis present

## 2021-04-20 DIAGNOSIS — I2581 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: Secondary | ICD-10-CM | POA: Diagnosis present

## 2021-04-20 DIAGNOSIS — F0393 Unspecified dementia, unspecified severity, with mood disturbance: Secondary | ICD-10-CM | POA: Diagnosis present

## 2021-04-20 DIAGNOSIS — Z66 Do not resuscitate: Secondary | ICD-10-CM | POA: Diagnosis present

## 2021-04-20 DIAGNOSIS — E876 Hypokalemia: Secondary | ICD-10-CM | POA: Diagnosis present

## 2021-04-20 DIAGNOSIS — T380X5A Adverse effect of glucocorticoids and synthetic analogues, initial encounter: Secondary | ICD-10-CM | POA: Diagnosis present

## 2021-04-20 DIAGNOSIS — E039 Hypothyroidism, unspecified: Secondary | ICD-10-CM | POA: Diagnosis present

## 2021-04-20 DIAGNOSIS — Z20822 Contact with and (suspected) exposure to covid-19: Secondary | ICD-10-CM | POA: Diagnosis present

## 2021-04-20 DIAGNOSIS — Z95828 Presence of other vascular implants and grafts: Secondary | ICD-10-CM | POA: Diagnosis not present

## 2021-04-20 DIAGNOSIS — Z794 Long term (current) use of insulin: Secondary | ICD-10-CM

## 2021-04-20 DIAGNOSIS — N184 Chronic kidney disease, stage 4 (severe): Secondary | ICD-10-CM | POA: Diagnosis present

## 2021-04-20 DIAGNOSIS — F03918 Unspecified dementia, unspecified severity, with other behavioral disturbance: Secondary | ICD-10-CM | POA: Diagnosis present

## 2021-04-20 DIAGNOSIS — M47817 Spondylosis without myelopathy or radiculopathy, lumbosacral region: Secondary | ICD-10-CM | POA: Diagnosis present

## 2021-04-20 DIAGNOSIS — M549 Dorsalgia, unspecified: Secondary | ICD-10-CM | POA: Diagnosis not present

## 2021-04-20 DIAGNOSIS — D631 Anemia in chronic kidney disease: Secondary | ICD-10-CM | POA: Diagnosis present

## 2021-04-20 DIAGNOSIS — E871 Hypo-osmolality and hyponatremia: Secondary | ICD-10-CM | POA: Diagnosis present

## 2021-04-20 DIAGNOSIS — E875 Hyperkalemia: Secondary | ICD-10-CM | POA: Diagnosis present

## 2021-04-20 DIAGNOSIS — E1165 Type 2 diabetes mellitus with hyperglycemia: Secondary | ICD-10-CM

## 2021-04-20 DIAGNOSIS — D696 Thrombocytopenia, unspecified: Secondary | ICD-10-CM | POA: Diagnosis present

## 2021-04-20 DIAGNOSIS — M48061 Spinal stenosis, lumbar region without neurogenic claudication: Secondary | ICD-10-CM | POA: Diagnosis present

## 2021-04-20 DIAGNOSIS — I5032 Chronic diastolic (congestive) heart failure: Secondary | ICD-10-CM | POA: Diagnosis present

## 2021-04-20 DIAGNOSIS — N179 Acute kidney failure, unspecified: Secondary | ICD-10-CM | POA: Diagnosis present

## 2021-04-20 DIAGNOSIS — N1832 Chronic kidney disease, stage 3b: Secondary | ICD-10-CM | POA: Diagnosis not present

## 2021-04-20 DIAGNOSIS — E7800 Pure hypercholesterolemia, unspecified: Secondary | ICD-10-CM | POA: Diagnosis present

## 2021-04-20 DIAGNOSIS — E1122 Type 2 diabetes mellitus with diabetic chronic kidney disease: Secondary | ICD-10-CM | POA: Diagnosis present

## 2021-04-20 DIAGNOSIS — E11649 Type 2 diabetes mellitus with hypoglycemia without coma: Secondary | ICD-10-CM | POA: Diagnosis present

## 2021-04-20 LAB — CBC
HCT: 36 % — ABNORMAL LOW (ref 39.0–52.0)
Hemoglobin: 12.2 g/dL — ABNORMAL LOW (ref 13.0–17.0)
MCH: 33.1 pg (ref 26.0–34.0)
MCHC: 33.9 g/dL (ref 30.0–36.0)
MCV: 97.6 fL (ref 80.0–100.0)
Platelets: 91 K/uL — ABNORMAL LOW (ref 150–400)
RBC: 3.69 MIL/uL — ABNORMAL LOW (ref 4.22–5.81)
RDW: 14 % (ref 11.5–15.5)
WBC: 12.2 K/uL — ABNORMAL HIGH (ref 4.0–10.5)
nRBC: 0 % (ref 0.0–0.2)

## 2021-04-20 LAB — BASIC METABOLIC PANEL WITH GFR
Anion gap: 12 (ref 5–15)
BUN: 49 mg/dL — ABNORMAL HIGH (ref 8–23)
CO2: 23 mmol/L (ref 22–32)
Calcium: 8.5 mg/dL — ABNORMAL LOW (ref 8.9–10.3)
Chloride: 98 mmol/L (ref 98–111)
Creatinine, Ser: 2.05 mg/dL — ABNORMAL HIGH (ref 0.61–1.24)
GFR, Estimated: 29 mL/min — ABNORMAL LOW
Glucose, Bld: 235 mg/dL — ABNORMAL HIGH (ref 70–99)
Potassium: 5.2 mmol/L — ABNORMAL HIGH (ref 3.5–5.1)
Sodium: 133 mmol/L — ABNORMAL LOW (ref 135–145)

## 2021-04-20 LAB — GLUCOSE, CAPILLARY
Glucose-Capillary: 255 mg/dL — ABNORMAL HIGH (ref 70–99)
Glucose-Capillary: 285 mg/dL — ABNORMAL HIGH (ref 70–99)
Glucose-Capillary: 314 mg/dL — ABNORMAL HIGH (ref 70–99)
Glucose-Capillary: 308 mg/dL — ABNORMAL HIGH (ref 70–99)
Glucose-Capillary: 292 mg/dL — ABNORMAL HIGH (ref 70–99)

## 2021-04-20 MED ORDER — INSULIN ASPART 100 UNIT/ML IJ SOLN
5.0000 [IU] | Freq: Once | INTRAMUSCULAR | Status: AC
  Administered 2021-04-20: 5 [IU] via SUBCUTANEOUS

## 2021-04-20 MED ORDER — INSULIN GLARGINE-YFGN 100 UNIT/ML ~~LOC~~ SOLN
10.0000 [IU] | Freq: Every day | SUBCUTANEOUS | Status: DC
  Administered 2021-04-20: 10 [IU] via SUBCUTANEOUS
  Filled 2021-04-20 (×3): qty 0.1

## 2021-04-20 NOTE — Care Management Obs Status (Signed)
[facility] NOTIFICATION   Patient Details  Name: Eddi Hymes MRN: [id number] Date of Birth: May 16, 1919   Medicare Observation Status Notification Given:  Yes    Angelita Ingles, RN 04/20/2021, 9:12 AM

## 2021-04-20 NOTE — Evaluation (Signed)
Physical Therapy Evaluation Patient Details Name: Troy Hicks MRN: [id number] DOB: Dec 14, 1919 Today's Date: 04/20/2021  History of Present Illness  88 y.o. male with medical history significant of diabetes, high thrombocytopenia, hyperlipidemia, peripheral vascular disease, atrial fibrillation, depression, hypothyroidism, CHF, gout, glaucoma, hypertension, dysphagia, dementia, CKD 3 who presented with ongoing back pain.  Clinical Impression  Patient presents with decreased mobility due to pain limiting even getting up to EOB.  Able to work some in supine for mobilizing spine and L sciatic nerve glides.  Patient tolerated these well.  He has been somewhat immobile at home for about a week and half prior to admission per daughter.  Reports significant decline for 6 month period.  She is in agreement with STSNF at d/c as needs higher level of care than ALF.  Also expressing concern for his kidneys as feels he has had no output over last day. RN aware.  PT will continue to follow acutely and work on timing of mobility with meds if pt will allow.      Recommendations for follow up therapy are one component of a multi-disciplinary discharge planning process, led by the attending physician.  Recommendations may be updated based on patient status, additional functional criteria and insurance authorization.  Follow Up Recommendations Skilled nursing-short term rehab (<3 hours/day)    Assistance Recommended at Discharge Frequent or constant Supervision/Assistance  Patient can return home with the following  Two people to help with walking and/or transfers;A lot of help with bathing/dressing/bathroom    Equipment Recommendations None recommended by PT  Recommendations for Other Services       Functional Status Assessment Patient has had a recent decline in their functional status and/or demonstrates limited ability to make significant improvements in function in a reasonable and predictable amount of  time     Precautions / Restrictions Precautions Precautions: Fall      Mobility  Bed Mobility Overal bed mobility: Needs Assistance Bed Mobility: Supine to Sit     Supine to sit: HOB elevated, Mod assist, +2 for physical assistance     General bed mobility comments: attempting with slow movements and elevating HOB to move legs off bed and lift trunk with +2 A but pt reported too painful once legs off and trunk partially up so returned to supine    Transfers                        Ambulation/Gait                  Stairs            Wheelchair Mobility    Modified Rankin (Stroke Patients Only)       Balance                                             Pertinent Vitals/Pain Pain Assessment Pain Score: 8  Pain Location: L LE with nerve gliding activity Pain Descriptors / Indicators: Aching, Grimacing, Guarding Pain Intervention(s): Monitored during session, Limited activity within patient's tolerance, Repositioned    Home Living Family/patient expects to be discharged to:: Assisted living                 Home Equipment: Conservation officer, nature (2 wheels);Hand held shower head;Grab bars - tub/shower;Grab bars - toilet;Wheelchair - manual;Shower seat - built in  Prior Function Prior Level of Function : Needs assist             Mobility Comments: about a week and a half ago was able to get up and to breakfast table in his room at ALF.  Since has been too painful to move, daughter reports progressive decline over past 6 months ADLs Comments: was getting meals delivered to his room, daughter reports needs higher level of care     Hand Dominance   Dominant Hand: Right    Extremity/Trunk Assessment   Upper Extremity Assessment Upper Extremity Assessment: Defer to OT evaluation    Lower Extremity Assessment Lower Extremity Assessment: RLE deficits/detail;LLE deficits/detail RLE Deficits / Details: AROM WFL,  strength hip flexion 3/5, knee extension 4-/5, ankle DF 4-/5 LLE Deficits / Details: AROM WFL,. strength hip flexion limited due to pain in back on L side~2+/5, knee extension 3+/5, ankle DF 4-/5    Cervical / Trunk Assessment Cervical / Trunk Assessment: Other exceptions Cervical / Trunk Exceptions: kypohoscoliotic  Communication   Communication: HOH (a little better hearing in R ear with some lip reading)  Cognition Arousal/Alertness: Awake/alert Behavior During Therapy: WFL for tasks assessed/performed Overall Cognitive Status: History of cognitive impairments - at baseline                                 General Comments: decreased STM        General Comments      Exercises Other Exercises Other Exercises: low trunk rotation with limited ROM to L due to pain x 5 AAROM Other Exercises: nerve gliding on L LE x 5 reps x 2 sets   Assessment/Plan    PT Assessment Patient needs continued PT services  PT Problem List Decreased strength;Decreased mobility;Decreased activity tolerance;Decreased knowledge of use of DME;Pain;Decreased knowledge of precautions;Decreased safety awareness       PT Treatment Interventions DME instruction;Therapeutic activities;Balance training;Functional mobility training;Patient/family education;Therapeutic exercise;Gait training    PT Goals (Current goals can be found in the Care Plan section)  Acute Rehab PT Goals Patient Stated Goal: to move with less pain PT Goal Formulation: With patient/family Time For Goal Achievement: 05/04/21 Potential to Achieve Goals: Fair    Frequency Min 2X/week     Co-evaluation               AM-PAC PT "6 Clicks" Mobility  Outcome Measure Help needed turning from your back to your side while in a flat bed without using bedrails?: Total Help needed moving from lying on your back to sitting on the side of a flat bed without using bedrails?: Total Help needed moving to and from a bed to a chair  (including a wheelchair)?: Total Help needed standing up from a chair using your arms (e.g., wheelchair or bedside chair)?: Total Help needed to walk in hospital room?: Total Help needed climbing 3-5 steps with a railing? : Total 6 Click Score: 6    End of Session   Activity Tolerance: Patient limited by pain Patient left: in bed;with call bell/phone within reach;with family/visitor present;with bed alarm set   PT Visit Diagnosis: Other abnormalities of gait and mobility (R26.89);Pain;Muscle weakness (generalized) (M62.81) Pain - Right/Left: Left Pain - part of body: Leg (& low back)    Time: 3825-0539 PT Time Calculation (min) (ACUTE ONLY): 28 min   Charges:   PT Evaluation $PT Eval Moderate Complexity: 1 Mod PT Treatments $Therapeutic Activity: 8-22  mins        Magda Kiel, PT Acute Rehabilitation Services Pager:[phone] [phone] 04/20/2021   [name] 04/20/2021, 6:02 PM

## 2021-04-20 NOTE — TOC Progression Note (Signed)
Transition of Care [facility]) - Progression Note    Patient Details  Name: Troy Hicks MRN: [id number] Date of Birth: Apr 06, 1919  Transition of Care [facility]) CM/SW [place], RN Phone Number:[phone]  04/20/2021, 9:13 AM  Clinical Narrative:    TOC acknowledges that patient will have disposition needs. Patient is from [facility]. [facility] will follow for disposition needs.         Expected Discharge Plan and Services                                                 Social Determinants of Health (SDOH) Interventions    Readmission Risk Interventions No flowsheet data found.

## 2021-04-20 NOTE — Progress Notes (Signed)
[facility] PROGRESS NOTE   Troy Hicks JFH:[id number] DOB: [birth year] DOA: 04/18/2021  0 DOS: the patient was seen and examined on 04/20/2021  PCP: [name], FNP  Brief History and Hospital Course:  88 y.o. male with medical history significant of diabetes, high thrombocytopenia, hyperlipidemia, peripheral vascular disease, atrial fibrillation, depression, hypothyroidism, CHF, gout, glaucoma, hypertension, dysphagia, dementia, CKD 3 who presented with ongoing back pain.  Patient has had chronic back pain for a very long time and over the last 6 months has had difficulty walking.  Follows with pain management.  Pain was thought to be particularly worse this past week.  Daughter discussed with patient's primary care provider who recommended bringing the patient to the emergency department.  MRI was considered however patient has IVC filter in place and as a result MRI could not be done since the filter material could not be verified.  Apparently this filter was placed about 25 to 30 years ago at outside facility.  Patient was hospitalized for pain management.    Consultants: Phone discussion with neurosurgery by admitting team/EDP  Procedures: None    Subjective: Patient has not noted any improvement in his back pain as yet.  Still able to move his legs.  Complaints offered.   Assessment/Plan:   * Intractable back pain- (present on admission) Patient with longstanding history of chronic back pain.  Has been on opioid pain medications.  Follows with pain management clinic.  Was brought in because of significant worsening in the pain.  No history of any falls or injuries recently.  No neurological deficits noted on examination.   MRI was initially ordered but could not be done since he has metal IVC filter.  This filter was apparently placed in outside facility more than 20 years ago.   I did try to call his cardiologist in [place] but have not heard back.  I also called [facility] in [place] and the radiology department does not have any records of the type of IVC that was placed.  So with this limited information we will not be able to do MRI in this patient.  Patient subsequently underwent CT of the lumbar spine.  Findings were discussed with neurosurgery.   Pain likely from significant spinal stenosis and lumbar area.  No role for any operative intervention currently.   After discussions with his daughter and since likelihood of infection is low patient was started on steroids yesterday.  PT and OT evaluation.  Hyperglycemia due to type 2 diabetes mellitus ([place])- (present on admission) CBGs noted to be elevated today.  He is on SSI.  HbA1c 6.6.  Prior to admission he was on Lantus.  We will start at lower dose since now he is on steroids.  Hypothyroid- (present on admission) Continue levothyroxine.  Essential hypertension- (present on admission) Blood pressure reasonably well controlled.  Losartan placed on hold due to elevated creatinine.  Furosemide dose was decreased as well.    Moderate recurrent major depression ([place])- (present on admission) Noted to be on Lexapro and trazodone.  Dementia ([place])- (present on admission) Stable.  Continue home medication  CKD (chronic kidney disease), stage III ([place])- (present on admission) Baseline renal function noted to be between 1.8-2.1.  Occasional higher readings noted.  No function fluctuating but close to baseline.  Monitor urine output.  Watch for worsening function. Potassium noted to be mildly elevated.  We will recheck tomorrow.  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  No overt bleeding.      DVT Prophylaxis: SCDs Code  Status: DNR Family Communication: Discussed with patient.  No family at bedside. Disposition Plan: To be determined  Status is: Observation The patient will require care spanning > 2 midnights and should be moved to inpatient because: Intractable back pain  Planned Discharge Destination: Home with Home Health          Medications: Scheduled:  aspirin EC  81 mg Oral Daily   colestipol  1 g Oral QODAY   enoxaparin (LOVENOX) injection  30 mg Subcutaneous Daily   escitalopram  10 mg Oral Daily   furosemide  80 mg Oral Daily   insulin aspart  0-15 Units Subcutaneous TID WC   levothyroxine  100 mcg Oral QAC breakfast   lidocaine  1 patch Transdermal Q24H   lipase/protease/amylase  36,000 Units Oral TID AC   methylPREDNISolone (SOLU-MEDROL) injection  40 mg Intravenous Q12H   polyethylene glycol  17 g Oral Daily   sodium chloride flush  3 mL Intravenous Q12H   traZODone  25 mg Oral QHS   Continuous: WOE:HOZYYQMGNOIBB **OR** acetaminophen, HYDROcodone-acetaminophen, HYDROmorphone (DILAUDID) injection, loperamide, polyethylene glycol, senna  Antibiotics: Anti-infectives (From admission, onward)    None       Objective:  Vital Signs  Vitals:   04/19/21 2114 04/19/21 2307 04/20/21 0345 04/20/21 0748  BP: 121/68 114/73 110/79 111/77  Pulse: 84 83 79 86  Resp: (!) 24 20 16 16   Temp: 97.6 F (36.4 C) 98.3 F (36.8 C) 98.3 F (36.8 C) 98 F (36.7 C)  TempSrc: Oral Oral Axillary Oral  SpO2: 100% 96% 97% 95%  Weight:   71.6 kg   Height:        Intake/Output Summary (Last 24 hours) at 04/20/2021 1110 Last data filed at 04/19/2021 1724 Gross per 24 hour  Intake --  Output 300 ml  Net -300 ml   Filed Weights   04/18/21 1435 04/20/21 0345  Weight: 77.8 kg 71.6 kg    General appearance: Awake alert.  In no distress.  Hard of hearing Resp: Clear to auscultation bilaterally.  Normal effort Cardio: S1-S2 is normal regular.  No S3-S4.  No rubs murmurs or bruit GI:  Abdomen is soft.  Nontender nondistended.  Bowel sounds are present normal.  No masses organomegaly Extremities: No edema.  Able to lift both his legs off the bed although this does induce pain. Neurologic:  No focal neurological deficits.     Lab Results:  Data Reviewed: I have personally reviewed labs and imaging study reports  CBC: Recent Labs  Lab 04/18/21 1653 04/19/21 0435 04/20/21 0145  WBC 12.9* 12.6* 12.2*  NEUTROABS 9.9*  --   --   HGB 12.5* 12.5* 12.2*  HCT 37.8* 38.0* 36.0*  MCV 99.0 100.0 97.6  PLT 104* 102* 91*     Basic Metabolic Panel: Recent Labs  Lab 04/18/21 1653 04/19/21 0435 04/20/21 0145  NA 138 138 133*  K 4.5 4.3 5.2*  CL 104 105 98  CO2 25 22 23   GLUCOSE 56* 94 235*  BUN 40* 40* 49*  CREATININE 2.04* 1.90* 2.05*  CALCIUM 8.7* 8.5* 8.5*     GFR: Estimated Creatinine Clearance: 17.7 mL/min (A) (by C-G formula based on SCr of 2.05 mg/dL (H)).  Liver Function Tests: Recent  Labs  Lab 04/18/21 1653 04/19/21 0435  AST 22 19  ALT 17 15  ALKPHOS 145* 131*  BILITOT 0.8 0.7  PROT 6.8 6.3*  ALBUMIN 3.0* 2.7*       HbA1C: Recent Labs    04/19/21 0435  HGBA1C 6.6*     CBG: Recent Labs  Lab 04/19/21 0740 04/19/21 1128 04/19/21 1806 04/19/21 2213 04/20/21 0800  GLUCAP 81 191* 186* 211* 255*       Radiology Studies: CT ABDOMEN PELVIS WO CONTRAST  Result Date: 04/18/2021 CLINICAL DATA:  Flank pain, kidney stone suspected. Back pain and left hip pain. EXAM: CT ABDOMEN AND PELVIS WITHOUT CONTRAST TECHNIQUE: Multidetector CT imaging of the abdomen and pelvis was performed following the standard protocol without IV contrast. RADIATION DOSE REDUCTION: This exam was performed according to the departmental dose-optimization program which includes automated exposure control, adjustment of the mA and/or kV according to patient size and/or use of iterative reconstruction technique. COMPARISON:  None. FINDINGS: Lower chest: The heart  is enlarged and there is no pericardial effusion. Coronary artery calcifications are noted. Bronchiectasis is noted in the lower lobes bilaterally and mild atelectasis is seen at the lung bases. Hepatobiliary: No focal liver abnormality is seen. No gallstones, gallbladder wall thickening, or biliary dilatation. Pancreas: Fatty atrophy is noted. No pancreatic ductal dilatation or surrounding inflammatory changes. Spleen: Normal in size without focal abnormality. Adrenals/Urinary Tract: The adrenal glands are within normal limits. A cyst is present in the mid right kidney measuring 1.7 cm. No renal calculus or hydronephrosis. The bladder is unremarkable. Stomach/Bowel: The stomach is within normal limits. No bowel obstruction, free air, or pneumatosis. Scattered diverticula are present along the colon without evidence of diverticulitis. The appendix is not visualized on exam. Vascular/Lymphatic: Aortic atherosclerotic calcification. Extensive vascular calcifications are noted in the abdomen and pelvis. An IVC filter is noted. No abdominal or pelvic lymphadenopathy. Reproductive: The prostate gland is enlarged. Other: A small fat containing inguinal hernia is noted on the left. Small fat containing umbilical hernia. No ascites. Musculoskeletal: Sternotomy wires are noted over the midline. Degenerative changes are present in the thoracolumbar spine. There is bilateral spondylolysis at L5 with grade 2 anterolisthesis. IMPRESSION: 1. No acute intra-abdominal process. 2. No renal calculus or obstructive uropathy bilaterally. Right renal cyst. 3. Diverticulosis without diverticulitis. Electronically Signed   By: Brett Fairy M.D.   On: 04/18/2021 22:11   CT L-SPINE NO CHARGE  Result Date: 04/18/2021 CLINICAL DATA:  Back pain, left hip pain. EXAM: CT LUMBAR SPINE WITHOUT CONTRAST TECHNIQUE: Multidetector CT imaging of the lumbar spine was performed without intravenous contrast administration. Multiplanar CT image  reconstructions were also generated. RADIATION DOSE REDUCTION: This exam was performed according to the departmental dose-optimization program which includes automated exposure control, adjustment of the mA and/or kV according to patient size and/or use of iterative reconstruction technique. COMPARISON:  04/18/2021. FINDINGS: Segmentation: 5 lumbar type vertebrae. Alignment: There is bilateral spondylolysis at L5 with grade 2 anterolisthesis at L5-S1. Mild retrolisthesis is present at L2-L3. There is dextroscoliosis of the lumbar spine. Vertebrae: No acute fracture. There is partial fusion of the T12, L1, and L2 vertebral bodies. Degenerative changes are present at the sacroiliac joints bilaterally Paraspinal and other soft tissues: No acute abnormality. Disc levels: Vacuum disc phenomena is noted from L2-S1. L1-L2: There is partial fusion of the L1-L2 vertebral bodies. Disc osteophyte complexes and facet arthropathy is noted resulting in moderate spinal canal and mild neural foraminal stenosis bilaterally. L2-L3: There disc osteophyte  complexes with facet arthropathy resulting in moderate spinal canal stenosis. There is moderate neural foraminal stenosis on left and mild neural foraminal stenosis on the right. L3-L4: There is a disc osteophyte complex with facet arthropathy resulting in severe spinal canal and left neural foraminal stenosis. The right neural foramen is within normal limits. L4-L5: There is a disc herniation with endplate osteophyte formation and facet arthropathy resulting in severe spinal canal stenosis. Moderate neural foraminal stenosis is present bilaterally. L5-S1: There is a disc herniation with osteophyte formation and facet arthropathy resulting in moderate to severe spinal canal stenosis. Moderate neural foraminal stenosis is present bilaterally. IMPRESSION: 1. No acute fracture. 2. Dextroscoliosis. 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 3. Moderate to severe  degenerative changes and spinal canal stenosis in the lumbar spine. Electronically Signed   By: Brett Fairy M.D.   On: 04/18/2021 22:21   DG Abd Portable 1 View  Result Date: 04/18/2021 CLINICAL DATA:  MRI clearance. EXAM: PORTABLE ABDOMEN - 1 VIEW COMPARISON:  None. FINDINGS: Sternotomy wires and mediastinal surgical clips are present. IVC filter is present. There are dense calcifications of the splenic artery. Bowel-gas pattern is nonobstructive. There is average stool burden. No suspicious calcifications. There is curvature of the lumbar spine with multilevel degenerative change. IMPRESSION: 1. Sternotomy wires, mediastinal clips and IVC filter present. 2. Nonobstructive bowel gas pattern. Electronically Signed   By: Ronney Asters M.D.   On: 04/18/2021 19:44       LOS: 0 days   [facility] Pager on [email or web address]  04/20/2021, 11:10 AM

## 2021-04-20 NOTE — Care Management Obs Status (Signed)
[place] NOTIFICATION   Patient Details  Name: Keandre Linden MRN: [id number] Date of Birth: [birth year]   Medicare Observation Status Notification Given:  Yes    Angelita Ingles, RN 04/20/2021, 9:11 AM

## 2021-04-20 NOTE — Evaluation (Signed)
Occupational Therapy Evaluation Patient Details Name: Troy Hicks MRN: [id number] DOB: [birth year] Today's Date: 04/20/2021   History of Present Illness 88 y.o. male with medical history significant of diabetes, high thrombocytopenia, hyperlipidemia, peripheral vascular disease, atrial fibrillation, depression, hypothyroidism, CHF, gout, glaucoma, hypertension, dysphagia, dementia, CKD 3 who presented with ongoing back pain.   Clinical Impression   Patient admitted for the diagnosis above.  PTA he lives in the [place].  Patient states declining mobility and ADL independence with worsening back pain.  Currently needing assist with lower Body ADL from staff, and restricting mobility to in room.  Pain is the primary deficit.  Patient restricting eval to bed level currently. Out of bed and mobility to bathroom will need to be assessed.  Patient is very [place].  OT will follow in the acute setting, currently recommend return to ALF with assist as needed, and potentially [facility] OT.         Recommendations for follow up therapy are one component of a multi-disciplinary discharge planning process, led by the attending physician.  Recommendations may be updated based on patient status, additional functional criteria and insurance authorization.   Follow Up Recommendations  Home health OT    Assistance Recommended at Discharge Frequent or constant Supervision/Assistance  Patient can return home with the following      Functional Status Assessment  Patient has had a recent decline in their functional status and demonstrates the ability to make significant improvements in function in a reasonable and predictable amount of time.  Equipment Recommendations  BSC/3in1    Recommendations for Other Services       Precautions / Restrictions Precautions Precautions: Fall Restrictions Weight Bearing Restrictions: No      Mobility Bed Mobility Overal bed mobility: Needs Assistance Bed  Mobility: Rolling Rolling: Min assist         General bed mobility comments: increased time and cues for use of side rails and log roll Patient Response: Cooperative  Transfers                   General transfer comment: Deferred OOB - patient stating his pain has just settled      Balance                                           ADL either performed or assessed with clinical judgement   ADL       Grooming: Wash/dry hands;Wash/dry face;Set up;Bed level           Upper Body Dressing : Minimal assistance;Bed level   Lower Body Dressing: Bed level;Maximal assistance                       Vision Baseline Vision/History: 1 Wears glasses Patient Visual Report: No change from baseline;Blurring of vision       Perception Perception Perception: Not tested   Praxis Praxis Praxis: Not tested    Pertinent Vitals/Pain Pain Assessment Pain Assessment: Faces Faces Pain Scale: Hurts even more Pain Location: low back and L hip with movement Pain Descriptors / Indicators: Grimacing, Guarding, Sharp Pain Intervention(s): Monitored during session     Hand Dominance Right   Extremity/Trunk Assessment Upper Extremity Assessment Upper Extremity Assessment: Overall WFL for tasks assessed   Lower Extremity Assessment Lower Extremity Assessment: Defer to PT evaluation  Communication Communication Communication: HOH   Cognition Arousal/Alertness: Awake/alert Behavior During Therapy: WFL for tasks assessed/performed Overall Cognitive Status: History of cognitive impairments - at baseline                                 General Comments: Follows commands, oriented to place and situation.  Very forgetful/poor STM     General Comments       Exercises     Shoulder Instructions      Home Living Family/patient expects to be discharged to:: Assisted living                             Home Equipment:  Rolling Walker (2 wheels);Hand held shower head;Grab bars - tub/shower;Grab bars - toilet;Wheelchair - manual;Shower seat - built in          Prior Functioning/Environment Prior Level of Function : Needs assist             Mobility Comments: Patient states walking with RW at baseline.  States no longer able to walk to [employer] due to back pain.  Does not want to use wheelchair. ADLs Comments: [facility] staff is assisting with self care due to back pain.        OT Problem List: Pain      OT Treatment/Interventions: Self-care/ADL training;Therapeutic activities;DME and/or AE instruction;Balance training;Patient/family education    OT Goals(Current goals can be found in the care plan section) Acute Rehab OT Goals Patient Stated Goal: Hurt less OT Goal Formulation: With patient Time For Goal Achievement: 05/04/21 Potential to Achieve Goals: Good ADL Goals Pt Will Perform Grooming: with supervision;sitting Pt Will Perform Lower Body Bathing: with supervision;sit to/from stand Pt Will Perform Lower Body Dressing: with supervision;sit to/from stand Pt Will Transfer to Toilet: with supervision;ambulating;regular height toilet  OT Frequency: Min 2X/week    Co-evaluation              AM-PAC OT "6 Clicks" Daily Activity     Outcome Measure Help from another person eating meals?: None Help from another person taking care of personal grooming?: A Little Help from another person toileting, which includes using toliet, bedpan, or urinal?: A Lot Help from another person bathing (including washing, rinsing, drying)?: A Lot Help from another person to put on and taking off regular upper body clothing?: A Lot Help from another person to put on and taking off regular lower body clothing?: A Lot 6 Click Score: 15   End of Session Nurse Communication: Mobility status  Activity Tolerance: Patient limited by pain Patient left: in bed;with call bell/phone within reach  OT Visit  Diagnosis: Pain                Time: 0867-6195 OT Time Calculation (min): 21 min Charges:  OT General Charges $OT Visit: 1 Visit OT Evaluation $OT Eval Moderate Complexity: 1 Mod  04/20/2021  RP, OTR/L  Acute Rehabilitation Services  Office:  [phone]   Metta Clines 04/20/2021, 11:54 AM

## 2021-04-20 NOTE — Progress Notes (Signed)
Inpatient Diabetes Program Recommendations  AACE/ADA: New Consensus Statement on Inpatient Glycemic Control (2015)  Target Ranges:  Prepandial:   less than 140 mg/dL      Peak postprandial:   less than 180 mg/dL (1-2 hours)      Critically ill patients:  140 - 180 mg/dL   Lab Results  Component Value Date   GLUCAP 255 (H) 04/20/2021   HGBA1C 6.6 (H) 04/19/2021    Review of Glycemic Control  Latest Reference Range & Units 04/19/21 18:06 04/19/21 22:13 04/20/21 08:00  Glucose-Capillary 70 - 99 mg/dL 186 (H) 211 (H) 255 (H)  (H): Data is abnormally high  Diabetes history: DM2 Outpatient Diabetes medications: Lantus 35 units qd Current orders for Inpatient glycemic control: Novolog correction 0-15 units tid Solumedrol 40 mg BID  Inpatient Diabetes Program Recommendations:   Noted A1c 6.6. Please consider decrease in home insulin Lantus for discharge.   Thanks, Bronson Curb, MSN, RNC-OB Diabetes Coordinator [phone] (8a-5p)

## 2021-04-21 DIAGNOSIS — M549 Dorsalgia, unspecified: Secondary | ICD-10-CM

## 2021-04-21 DIAGNOSIS — N179 Acute kidney failure, unspecified: Secondary | ICD-10-CM

## 2021-04-21 DIAGNOSIS — I10 Essential (primary) hypertension: Secondary | ICD-10-CM | POA: Diagnosis not present

## 2021-04-21 DIAGNOSIS — I5022 Chronic systolic (congestive) heart failure: Secondary | ICD-10-CM | POA: Diagnosis present

## 2021-04-21 DIAGNOSIS — E1165 Type 2 diabetes mellitus with hyperglycemia: Secondary | ICD-10-CM | POA: Diagnosis not present

## 2021-04-21 DIAGNOSIS — N184 Chronic kidney disease, stage 4 (severe): Secondary | ICD-10-CM | POA: Diagnosis present

## 2021-04-21 DIAGNOSIS — E871 Hypo-osmolality and hyponatremia: Secondary | ICD-10-CM

## 2021-04-21 DIAGNOSIS — Z794 Long term (current) use of insulin: Secondary | ICD-10-CM

## 2021-04-21 LAB — GLUCOSE, CAPILLARY
Glucose-Capillary: 267 mg/dL — ABNORMAL HIGH (ref 70–99)
Glucose-Capillary: 306 mg/dL — ABNORMAL HIGH (ref 70–99)
Glucose-Capillary: 353 mg/dL — ABNORMAL HIGH (ref 70–99)
Glucose-Capillary: 290 mg/dL — ABNORMAL HIGH (ref 70–99)
Glucose-Capillary: 267 mg/dL — ABNORMAL HIGH (ref 70–99)

## 2021-04-21 LAB — BASIC METABOLIC PANEL
Anion gap: 12 (ref 5–15)
BUN: 89 mg/dL — ABNORMAL HIGH (ref 8–23)
CO2: 21 mmol/L — ABNORMAL LOW (ref 22–32)
Calcium: 8.3 mg/dL — ABNORMAL LOW (ref 8.9–10.3)
Chloride: 95 mmol/L — ABNORMAL LOW (ref 98–111)
Creatinine, Ser: 2.67 mg/dL — ABNORMAL HIGH (ref 0.61–1.24)
GFR, Estimated: 21 mL/min — ABNORMAL LOW (ref >60)
Glucose, Bld: 275 mg/dL — ABNORMAL HIGH (ref 70–99)
Potassium: 5.2 mmol/L — ABNORMAL HIGH (ref 3.5–5.1)
Sodium: 128 mmol/L — ABNORMAL LOW (ref 135–145)

## 2021-04-21 LAB — CBC
HCT: 32.7 % — ABNORMAL LOW (ref 39.0–52.0)
Hemoglobin: 11.5 g/dL — ABNORMAL LOW (ref 13.0–17.0)
MCH: 33.6 pg (ref 26.0–34.0)
MCHC: 35.2 g/dL (ref 30.0–36.0)
MCV: 95.6 fL (ref 80.0–100.0)
Platelets: 91 10*3/uL — ABNORMAL LOW (ref 150–400)
RBC: 3.42 MIL/uL — ABNORMAL LOW (ref 4.22–5.81)
RDW: 13.4 % (ref 11.5–15.5)
WBC: 12.2 10*3/uL — ABNORMAL HIGH (ref 4.0–10.5)
nRBC: 0 % (ref 0.0–0.2)

## 2021-04-21 MED ORDER — INSULIN GLARGINE-YFGN 100 UNIT/ML ~~LOC~~ SOLN
20.0000 [IU] | Freq: Every day | SUBCUTANEOUS | Status: DC
  Administered 2021-04-21 – 2021-04-22 (×2): 20 [IU] via SUBCUTANEOUS
  Filled 2021-04-21 (×2): qty 0.2

## 2021-04-21 MED ORDER — SODIUM CHLORIDE 0.9 % IV SOLN: INTRAVENOUS | Status: DC

## 2021-04-21 MED ORDER — INSULIN ASPART 100 UNIT/ML IJ SOLN
5.0000 [IU] | Freq: Three times a day (TID) | INTRAMUSCULAR | Status: DC
  Administered 2021-04-21 – 2021-04-23 (×8): 5 [IU] via SUBCUTANEOUS

## 2021-04-21 MED ORDER — SODIUM ZIRCONIUM CYCLOSILICATE 10 G PO PACK
10.0000 g | PACK | Freq: Once | ORAL | Status: AC
  Administered 2021-04-21: 10 g via ORAL
  Filled 2021-04-21: qty 1

## 2021-04-21 MED ORDER — INSULIN ASPART 100 UNIT/ML IJ SOLN
0.0000 [IU] | Freq: Three times a day (TID) | INTRAMUSCULAR | Status: DC
  Administered 2021-04-21: 11 [IU] via SUBCUTANEOUS
  Administered 2021-04-21: 20 [IU] via SUBCUTANEOUS
  Administered 2021-04-22 (×2): 11 [IU] via SUBCUTANEOUS
  Administered 2021-04-22: 7 [IU] via SUBCUTANEOUS
  Administered 2021-04-23: 4 [IU] via SUBCUTANEOUS
  Administered 2021-04-23 – 2021-04-24 (×3): 11 [IU] via SUBCUTANEOUS
  Administered 2021-04-24: 3 [IU] via SUBCUTANEOUS
  Administered 2021-04-24: 15 [IU] via SUBCUTANEOUS
  Administered 2021-04-25 (×2): 4 [IU] via SUBCUTANEOUS
  Administered 2021-04-25: 15 [IU] via SUBCUTANEOUS
  Administered 2021-04-26: 4 [IU] via SUBCUTANEOUS

## 2021-04-21 MED ORDER — INSULIN ASPART 100 UNIT/ML IJ SOLN
0.0000 [IU] | Freq: Every day | INTRAMUSCULAR | Status: DC
  Administered 2021-04-21: 2 [IU] via SUBCUTANEOUS
  Administered 2021-04-22: 3 [IU] via SUBCUTANEOUS
  Administered 2021-04-23: 2 [IU] via SUBCUTANEOUS

## 2021-04-21 NOTE — Progress Notes (Signed)
Physical Therapy Treatment Patient Details Name: Troy Hicks MRN: [id number] DOB: 04-02-1919 Today's Date: 04/21/2021   History of Present Illness 88 y.o. male with medical history significant of diabetes, high thrombocytopenia, hyperlipidemia, peripheral vascular disease, atrial fibrillation, depression, hypothyroidism, CHF, gout, glaucoma, hypertension, dysphagia, dementia, CKD 3 who presented with ongoing back pain.    PT Comments    Patient had progressed to OOB today with OT tolerating mobility better, however, after sitting 30 minutes reported his pain back up to 9/10 so assisted back to bed with mod A overall.  He reported coughing was very painful when clearing his throat as getting settled in bed so obtained incentive spirometer and pt performed x 5 reps with volume up to 1700.  Feel continued skilled PT in the acute setting indicated to progress mobility as tolerated.  Continue to recommend SNF level rehab at d/c.    Recommendations for follow up therapy are one component of a multi-disciplinary discharge planning process, led by the attending physician.  Recommendations may be updated based on patient status, additional functional criteria and insurance authorization.  Follow Up Recommendations  Skilled nursing-short term rehab (<3 hours/day)     Assistance Recommended at Discharge Frequent or constant Supervision/Assistance  Patient can return home with the following Two people to help with walking and/or transfers;A lot of help with bathing/dressing/bathroom   Equipment Recommendations  None recommended by PT    Recommendations for Other Services       Precautions / Restrictions Precautions Precautions: Fall     Mobility  Bed Mobility Overal bed mobility: Needs Assistance Bed Mobility: Sit to Sidelying         Sit to sidelying: Mod assist General bed mobility comments: assist for legs into bed at pt request    Transfers Overall transfer level: Needs  assistance Equipment used: Rolling walker (2 wheels) Transfers: Sit to/from Stand Sit to Stand: Mod assist   Step pivot transfers: Min assist       General transfer comment: up from recliner with lifting help then stand step to bed with RW and increased time for safety with lines    Ambulation/Gait                   Stairs             Wheelchair Mobility    Modified Rankin (Stroke Patients Only)       Balance Overall balance assessment: Needs assistance Sitting-balance support: Feet supported Sitting balance-Leahy Scale: Fair     Standing balance support: Bilateral upper extremity supported, Reliant on assistive device for balance Standing balance-Leahy Scale: Poor                              Cognition Arousal/Alertness: Awake/alert Behavior During Therapy: WFL for tasks assessed/performed Overall Cognitive Status: History of cognitive impairments - at baseline                                          Exercises Other Exercises Other Exercises: instructed in incentive spirometer performed x 5 with max volume 1700; encouraged every hour to perform x 5    General Comments General comments (skin integrity, edema, etc.): pt with increased pallor during transfer, BP 116/71 after supine, SpO2 97-99% on RA.      Pertinent Vitals/Pain Pain Assessment Pain Score: 9  Pain  Location: back, L leg up in chair and during transition. improved in supine Pain Descriptors / Indicators: Aching, Discomfort Pain Intervention(s): Monitored during session, Limited activity within patient's tolerance    Home Living                          Prior Function            PT Goals (current goals can now be found in the care plan section) Progress towards PT goals: Progressing toward goals    Frequency    Min 2X/week      PT Plan Current plan remains appropriate    Co-evaluation              AM-PAC PT "6 Clicks"  Mobility   Outcome Measure  Help needed turning from your back to your side while in a flat bed without using bedrails?: A Lot Help needed moving from lying on your back to sitting on the side of a flat bed without using bedrails?: A Lot Help needed moving to and from a bed to a chair (including a wheelchair)?: A Lot Help needed standing up from a chair using your arms (e.g., wheelchair or bedside chair)?: A Lot Help needed to walk in hospital room?: Total Help needed climbing 3-5 steps with a railing? : Total 6 Click Score: 10    End of Session Equipment Utilized During Treatment: Gait belt;Oxygen Activity Tolerance: Patient limited by pain Patient left: in bed;with call bell/phone within reach;with bed alarm set   PT Visit Diagnosis: Other abnormalities of gait and mobility (R26.89);Pain;Muscle weakness (generalized) (M62.81) Pain - Right/Left: Left Pain - part of body: Leg (& back)     Time: 5009-3818 PT Time Calculation (min) (ACUTE ONLY): 18 min  Charges:  $Therapeutic Activity: 8-22 mins                     Magda Kiel, PT Acute Rehabilitation Services EXHBZ:[phone] Office:[phone] 04/21/2021    [name] 04/21/2021, 1:07 PM

## 2021-04-21 NOTE — Assessment & Plan Note (Addendum)
Last echo in our system in 2021 showed EF less than 20%, G1 DD, RV function mildly reduced. Was somewhat hypokalemic during her reported admission.  Today is noted to have JVD and mild pedal edema.  Will be given a dose of Lasix today and we will see his response over the next 24 hours.  Patient was on Lasix 80 mg twice a day prior to admission which was held due to acute renal failure. Not a candidate for ACE inhibitor or ARB due to chronic kidney disease. Not on beta-blocker either. His furosemide was held due to worsening renal function and hyponatremia. It has now been resumed at 80 mg by mouth daily. Monitor creatinine, electrolytes and volume status.

## 2021-04-21 NOTE — Assessment & Plan Note (Addendum)
Acute Kidney Injury Hyperkalemia Hyponatremia  Baseline renal function noted to be between 1.8-2.1. Creatinine 1.88 today. Creatinine did climb up to 2.67 with elevated BUN.  Elevated BUN likely from steroids. Patient has had poor oral intake for several days due to his pain issues.  He was likely dehydrated.  Patient was given IV fluids with improvement in renal function and is close to baseline. Potassium improved with Lokelma.   Sodium level dropped as low as 126. Urine sodium was less than 10, urine osmolality 319.  Thought to be secondary to hypovolemia.  Was given normal saline infusion with improvement in sodium.  Sodium came up to 131 today.    Some degree of volume overload is noted. Continue to monitor closely now that lasix has been resumed, although at 1/2 the frequency that he takes it at home.

## 2021-04-21 NOTE — Progress Notes (Signed)
[facility] PROGRESS NOTE   Troy Hicks GBT:[id number] DOB: 11/18/1919 DOA: 04/18/2021  1 DOS: the patient was seen and examined on 04/21/2021  PCP: [name], FNP  Brief History and Hospital Course:  88 y.o. male with medical history significant of diabetes, high thrombocytopenia, hyperlipidemia, peripheral vascular disease, atrial fibrillation, depression, hypothyroidism, CHF, gout, glaucoma, hypertension, dysphagia, dementia, CKD 3 who presented with ongoing back pain.  Patient has had chronic back pain for a very long time and over the last 6 months has had difficulty walking.  Follows with pain management.  Pain was thought to be particularly worse this past week.  Daughter discussed with patient's primary care provider who recommended bringing the patient to the emergency department.  MRI was considered however patient has IVC filter in place and as a result MRI could not be done since the filter material could not be verified.  Apparently this filter was placed about 25 to 30 years ago at outside facility.  Patient was hospitalized for pain management.  Started on steroids.  Consultants: Phone discussion with neurosurgery by admitting team/EDP  Procedures: None    Subjective: Patient mentions that his back pain feels like it is improving.  Denies any other complaints.   Assessment/Plan:   * Intractable back pain- (present on admission) Patient with longstanding history of chronic back pain.  Has been on opioid pain medications.  Follows with pain management clinic.  Was brought in because of significant worsening in the pain.  No history of any falls or injuries recently.  No neurological deficits noted on examination.   MRI was initially ordered but could not be done since he has metal IVC filter.  This filter was apparently placed in outside facility more than 20 years ago.   I did try to call his cardiologist in [place] but have not heard back.  I also called [facility] in [place] where apparently the filter was placed and the radiology department does not have any records of the type of IVC that was placed.  So with this limited information we will not be able to do MRI in this patient.  Patient subsequently underwent CT of the lumbar spine.  Findings were discussed with neurosurgery.   Pain likely from significant spinal stenosis and lumbar area.  No role for any operative intervention currently.   After discussions with his daughter and since likelihood of infection is low patient was started on steroids.  Patient reports improvement in pain this morning.  We will continue steroids for now and we will see his response over the next 24 to 48 hours. Seen by PT and OT.  SNF has been recommended.   Hyperglycemia due to type 2 diabetes mellitus ([place])- (present on admission) HbA1c 6.6.  Prior to admission he was on Lantus.  It was held initially due to low glucose levels presumably from poor oral intake. Subsequently noted to have hyperglycemia from steroids.  Started back on Lantus.  Will change sliding scale coverage.  Lantus dose will be increased.  Hypothyroid- (present on admission) Continue levothyroxine.  Essential hypertension- (present on admission) Patient daughter informed me that he is no longer on losartan at home.  This has been discontinued. Blood pressure noted to be borderline low but stable.  Moderate recurrent major depression ([place])- (present on admission) Noted to be on Lexapro and trazodone.  Dementia ([place])- (present on admission) Stable.  Continue home medication  CKD (chronic kidney disease), stage III ([place])- (present on admission) Acute  Kidney Injury Hyperkalemia Hyponatremia  Baseline renal function noted to be between 1.8-2.1.  Occasional higher readings noted.  Creatinine noted to be significantly higher today compared yesterday with a rising BUN.  Likely from poor oral intake.  We will discontinue his Lasix.   Dose was previously decreased.  We will add IV fluids.  Monitor urine output. Potassium noted to be mildly elevated.  Possibly from steroids.  Recheck tomorrow Noted to have low sodium levels likely from poor oral intake.  We will put him on IV fluids.  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  No overt bleeding.      DVT Prophylaxis: SCDs Code Status: DNR Family Communication: Daughter being updated at least every other day. Disposition Plan: SNF  Status is: Inpatient  Remains inpatient appropriate because: Intractable back pain, ambulatory dysfunction   Planned Discharge Destination: SNF          Medications: Scheduled:  aspirin EC  81 mg Oral Daily   colestipol  1 g Oral QODAY   enoxaparin (LOVENOX) injection  30 mg Subcutaneous Daily   escitalopram  10 mg Oral Daily   insulin aspart  0-15 Units Subcutaneous TID WC   insulin glargine-yfgn  20 Units Subcutaneous Daily   levothyroxine  100 mcg Oral QAC breakfast   lidocaine  1 patch Transdermal Q24H   lipase/protease/amylase  36,000 Units Oral TID AC   methylPREDNISolone (SOLU-MEDROL) injection  40 mg Intravenous Q12H   polyethylene glycol  17 g Oral Daily   sodium chloride flush  3 mL Intravenous Q12H   traZODone  25 mg Oral QHS   Continuous:  sodium chloride 75 mL/hr at 04/21/21 0939   EXN:[id number] **OR** acetaminophen, HYDROcodone-acetaminophen, HYDROmorphone (DILAUDID) injection, loperamide, polyethylene glycol, senna  Antibiotics: Anti-infectives (From admission, onward)    None       Objective:  Vital Signs  Vitals:   04/20/21 2316 04/21/21 0327 04/21/21 0414 04/21/21 0742  BP: (!) 104/54 (!) 100/55  140/76  Pulse: 64 61   72  Resp: 16 20  19   Temp: 97.8 F (36.6 C) (!) 97.4 F (36.3 C)  (!) 97.5 F (36.4 C)  TempSrc: Oral Axillary  Oral  SpO2: 97% 95%  97%  Weight:   73.8 kg   Height:        Intake/Output Summary (Last 24 hours) at 04/21/2021 0950 Last data filed at 04/21/2021 0611 Gross per 24 hour  Intake --  Output 1100 ml  Net -1100 ml    Filed Weights   04/18/21 1435 04/20/21 0345 04/21/21 0414  Weight: 77.8 kg 71.6 kg 73.8 kg    General appearance: Awake alert.  In no distress.  Hard of hearing.  Appears to be more comfortable today. Resp: Clear to auscultation bilaterally.  Normal effort Cardio: S1-S2 is normal regular.  No S3-S4.  No rubs murmurs or bruit GI: Abdomen is soft.  Nontender nondistended.  Bowel sounds are present normal.  No masses organomegaly Extremities: No edema.  Improved mobility of the lower extremities noted. Neurologic:  No focal neurological deficits.      Lab Results:  Data Reviewed: I have personally reviewed labs and imaging study reports  CBC: Recent Labs  Lab 04/18/21 1653 04/19/21 0435 04/20/21 0145 04/21/21 0337  WBC 12.9* 12.6* 12.2* 12.2*  NEUTROABS  9.9*  --   --   --   HGB 12.5* 12.5* 12.2* 11.5*  HCT 37.8* 38.0* 36.0* 32.7*  MCV 99.0 100.0 97.6 95.6  PLT 104* 102* 91* 91*     Basic Metabolic Panel: Recent Labs  Lab 04/18/21 1653 04/19/21 0435 04/20/21 0145 04/21/21 0337  NA 138 138 133* 128*  K 4.5 4.3 5.2* 5.2*  CL 104 105 98 95*  CO2 25 22 23  21*  GLUCOSE 56* 94 235* 275*  BUN 40* 40* 49* 89*  CREATININE 2.04* 1.90* 2.05* 2.67*  CALCIUM 8.7* 8.5* 8.5* 8.3*     GFR: Estimated Creatinine Clearance: 13.6 mL/min (A) (by C-G formula based on SCr of 2.67 mg/dL (H)).  Liver Function Tests: Recent Labs  Lab 04/18/21 1653 04/19/21 0435  AST 22 19  ALT 17 15  ALKPHOS 145* 131*  BILITOT 0.8 0.7  PROT 6.8 6.3*  ALBUMIN 3.0* 2.7*       HbA1C: Recent Labs    04/19/21 0435  HGBA1C 6.6*     CBG: Recent Labs   Lab 04/20/21 1534 04/20/21 2133 04/20/21 2315 04/21/21 0336 04/21/21 0740  GLUCAP 314* 308* 292* 267* 306*       Radiology Studies: No results found.     LOS: 1 day   Carlis Blanchard [employer] on [email or web address]  04/21/2021, 9:50 AM

## 2021-04-21 NOTE — Progress Notes (Signed)
Reached out to on call provider about pt. Blood sugar of 308 with no coverage. Gershon Cull, NP ordered 5 units of insulin.

## 2021-04-21 NOTE — Progress Notes (Signed)
Inpatient Diabetes Program Recommendations  AACE/ADA: New Consensus Statement on Inpatient Glycemic Control   Target Ranges:  Prepandial:   less than 140 mg/dL      Peak postprandial:   less than 180 mg/dL (1-2 hours)      Critically ill patients:  140 - 180 mg/dL    Latest Reference Range & Units 04/21/21 03:36 04/21/21 07:40  Glucose-Capillary 70 - 99 mg/dL 267 (H) 306 (H)    Latest Reference Range & Units 04/20/21 08:00 04/20/21 11:19 04/20/21 15:34 04/20/21 21:33 04/20/21 23:15  Glucose-Capillary 70 - 99 mg/dL 255 (H) 285 (H) 314 (H) 308 (H) 292 (H)   Review of Glycemic Control  Diabetes history: DM2 Outpatient Diabetes medications: Lantus 35 units daily Current orders for Inpatient glycemic control: Semglee 20 units daily, Novolog 0-15 units TID with meals; Solumedrol 40 mg Q12H  Inpatient Diabetes Program Recommendations:    Insulin: Please consider ordering Novolog 0-5 units QHS and if steroids are continued please consider ordering Novolog 3 units TID with meals for meal coverage if patient eats at least 50% of meals.  Thanks, Barnie Alderman, RN, MSN, CDE Diabetes Coordinator Inpatient Diabetes Program [phone] (Team Pager from 8am to 5pm)

## 2021-04-21 NOTE — Progress Notes (Signed)
Occupational Therapy Treatment Patient Details Name: Troy Hicks MRN: [id number] DOB: 11-25-1919 Today's Date: 04/21/2021   History of present illness 88 y.o. male with medical history significant of diabetes, high thrombocytopenia, hyperlipidemia, peripheral vascular disease, atrial fibrillation, depression, hypothyroidism, CHF, gout, glaucoma, hypertension, dysphagia, dementia, CKD 3 who presented with ongoing back pain.   OT comments  Patient with good progress toward mobility/toileting goals this session.  Patient continues to have ongoing back pain, but able to sit with closer to Min A and increased time.  The patient stood with Min A and did take a few steps to the recliner with [employer].  Cues for reaching back prior to sitting.  Patient needs Mod A for lower body ADL, OT will need to educate on the use of hip kit for improved lower body ADL independence.  OT will continue efforts in the acute setting, SNF has been recommended for post acute rehab prior to returning home, given decreased support at his ALF.     Recommendations for follow up therapy are one component of a multi-disciplinary discharge planning process, led by the attending physician.  Recommendations may be updated based on patient status, additional functional criteria and insurance authorization.    Follow Up Recommendations  Skilled nursing-short term rehab (<3 hours/day)    Assistance Recommended at Discharge Frequent or constant Supervision/Assistance  Patient can return home with the following  A lot of help with walking and/or transfers;A lot of help with bathing/dressing/bathroom;Assist for transportation;Assistance with cooking/housework   Equipment Recommendations  BSC/3in1    Recommendations for Other Services      Precautions / Restrictions Precautions Precautions: Fall       Mobility Bed Mobility Overal bed mobility: Needs Assistance Bed Mobility: Sidelying to Sit   Sidelying to sit: Min assist,  Mod assist         Patient Response: Cooperative  Transfers Overall transfer level: Needs assistance Equipment used: Rolling walker (2 wheels) Transfers: Sit to/from Stand, Bed to chair/wheelchair/BSC Sit to Stand: Min assist     Step pivot transfers: Min assist           Balance Overall balance assessment: Needs assistance Sitting-balance support: Feet supported Sitting balance-Leahy Scale: Fair     Standing balance support: Reliant on assistive device for balance Standing balance-Leahy Scale: Poor                             ADL either performed or assessed with clinical judgement   ADL                   Upper Body Dressing : Minimal assistance;Sitting Upper Body Dressing Details (indicate cue type and reason): able to don gown like a jacket Lower Body Dressing: Moderate assistance;Sit to/from stand                        Cognition Arousal/Alertness: Awake/alert Behavior During Therapy: WFL for tasks assessed/performed Overall Cognitive Status: History of cognitive impairments - at baseline                                                             Pertinent Vitals/ Pain       Pain Assessment Pain Assessment: Faces Faces Pain  Scale: Hurts little more °Pain Descriptors / Indicators: Aching, Guarding °Pain Intervention(s): Monitored during session ° °   °  °  °  °  °  °  °  °  °  °  °  °  °  °  °  °  °  °  ° °  °    °  °  °  °   ° °Frequency ° Min 2X/week  ° ° ° ° °  °Progress Toward Goals ° °OT Goals(current goals can now be found in the care plan section) ° Progress towards OT goals: Progressing toward goals ° °Acute Rehab OT Goals °Patient Stated Goal: Move better °OT Goal Formulation: With patient °Time For Goal Achievement: 05/04/21 °Potential to Achieve Goals: Good  °Plan Discharge plan needs to be updated   ° °Co-evaluation ° ° °   °  °  °  °  ° °  °AM-PAC OT "6 Clicks" Daily Activity     °Outcome  Measure ° ° Help from another person eating meals?: None °Help from another person taking care of personal grooming?: A Little °Help from another person toileting, which includes using toliet, bedpan, or urinal?: A Lot °Help from another person bathing (including washing, rinsing, drying)?: A Lot °Help from another person to put on and taking off regular upper body clothing?: A Little °Help from another person to put on and taking off regular lower body clothing?: A Lot °6 Click Score: 16 ° °  °End of Session Equipment Utilized During Treatment: Rolling walker (2 wheels) ° °OT Visit Diagnosis: Pain;Unsteadiness on feet (R26.81);Muscle weakness (generalized) (M62.81) °  °Activity Tolerance Patient tolerated treatment well °  °Patient Left in chair;with call bell/phone within reach;with chair alarm set °  °Nurse Communication   °  ° °   ° °Time: 1052-1111 °OT Time Calculation (min): 19 min ° °Charges: OT General Charges °$OT Visit: 1 Visit °OT Treatments °$Self Care/Home Management : 8-22 mins ° °04/21/2021 ° °RP, OTR/L ° °Acute Rehabilitation Services ° °Office:  [phone] ° ° ° D  °04/21/2021, 11:35 AM °

## 2021-04-22 DIAGNOSIS — I10 Essential (primary) hypertension: Secondary | ICD-10-CM | POA: Diagnosis not present

## 2021-04-22 DIAGNOSIS — N179 Acute kidney failure, unspecified: Secondary | ICD-10-CM | POA: Diagnosis not present

## 2021-04-22 DIAGNOSIS — M549 Dorsalgia, unspecified: Secondary | ICD-10-CM | POA: Diagnosis not present

## 2021-04-22 DIAGNOSIS — E1165 Type 2 diabetes mellitus with hyperglycemia: Secondary | ICD-10-CM | POA: Diagnosis not present

## 2021-04-22 LAB — CREATININE, URINE, RANDOM: Creatinine, Urine: 29.56 mg/dL

## 2021-04-22 LAB — CBC
HCT: 32.2 % — ABNORMAL LOW (ref 39.0–52.0)
Hemoglobin: 11.3 g/dL — ABNORMAL LOW (ref 13.0–17.0)
MCH: 32.9 pg (ref 26.0–34.0)
MCHC: 35.1 g/dL (ref 30.0–36.0)
MCV: 93.9 fL (ref 80.0–100.0)
Platelets: 76 10*3/uL — ABNORMAL LOW (ref 150–400)
RBC: 3.43 MIL/uL — ABNORMAL LOW (ref 4.22–5.81)
RDW: 13.2 % (ref 11.5–15.5)
WBC: 8.6 10*3/uL (ref 4.0–10.5)
nRBC: 0 % (ref 0.0–0.2)

## 2021-04-22 LAB — BASIC METABOLIC PANEL
Anion gap: 11 (ref 5–15)
BUN: 99 mg/dL — ABNORMAL HIGH (ref 8–23)
CO2: 18 mmol/L — ABNORMAL LOW (ref 22–32)
Calcium: 8 mg/dL — ABNORMAL LOW (ref 8.9–10.3)
Chloride: 97 mmol/L — ABNORMAL LOW (ref 98–111)
Creatinine, Ser: 2.21 mg/dL — ABNORMAL HIGH (ref 0.61–1.24)
GFR, Estimated: 26 mL/min — ABNORMAL LOW (ref >60)
Glucose, Bld: 180 mg/dL — ABNORMAL HIGH (ref 70–99)
Potassium: 5.3 mmol/L — ABNORMAL HIGH (ref 3.5–5.1)
Sodium: 126 mmol/L — ABNORMAL LOW (ref 135–145)

## 2021-04-22 LAB — GLUCOSE, CAPILLARY
Glucose-Capillary: 227 mg/dL — ABNORMAL HIGH (ref 70–99)
Glucose-Capillary: 318 mg/dL — ABNORMAL HIGH (ref 70–99)
Glucose-Capillary: 283 mg/dL — ABNORMAL HIGH (ref 70–99)
Glucose-Capillary: 259 mg/dL — ABNORMAL HIGH (ref 70–99)

## 2021-04-22 LAB — OSMOLALITY, URINE: Osmolality, Ur: 319 mOsm/kg (ref 300–900)

## 2021-04-22 LAB — SODIUM, URINE, RANDOM: Sodium, Ur: <10 mmol/L

## 2021-04-22 MED ORDER — INSULIN GLARGINE-YFGN 100 UNIT/ML ~~LOC~~ SOLN
5.0000 [IU] | Freq: Once | SUBCUTANEOUS | Status: DC
Start: 2021-04-22 — End: 2021-04-24
  Filled 2021-04-22: qty 0.05

## 2021-04-22 MED ORDER — SODIUM CHLORIDE 0.9 % IV SOLN: INTRAVENOUS | Status: DC

## 2021-04-22 MED ORDER — ACETAMINOPHEN 325 MG PO TABS
650.0000 mg | ORAL_TABLET | Freq: Three times a day (TID) | ORAL | Status: DC
  Administered 2021-04-22 – 2021-04-27 (×16): 650 mg via ORAL
  Filled 2021-04-22 (×16): qty 2

## 2021-04-22 MED ORDER — INSULIN GLARGINE-YFGN 100 UNIT/ML ~~LOC~~ SOLN
25.0000 [IU] | Freq: Every day | SUBCUTANEOUS | Status: DC
  Administered 2021-04-23: 25 [IU] via SUBCUTANEOUS
  Filled 2021-04-22 (×2): qty 0.25

## 2021-04-22 MED ORDER — SODIUM ZIRCONIUM CYCLOSILICATE 10 G PO PACK
10.0000 g | PACK | Freq: Three times a day (TID) | ORAL | Status: AC
  Administered 2021-04-22 (×2): 10 g via ORAL
  Filled 2021-04-22 (×2): qty 1

## 2021-04-22 NOTE — Progress Notes (Signed)
[facility] PROGRESS NOTE   Troy Hicks INO:[id number] DOB: 1920-01-21 DOA: 04/18/2021  2 DOS: the patient was seen and examined on 04/22/2021  PCP: [name], FNP  Brief History and Hospital Course:  88 y.o. male with medical history significant of diabetes, high thrombocytopenia, hyperlipidemia, peripheral vascular disease, atrial fibrillation, depression, hypothyroidism, CHF, gout, glaucoma, hypertension, dysphagia, dementia, CKD 3 who presented with ongoing back pain.  Patient has had chronic back pain for a very long time and over the last 6 months has had difficulty walking.  Follows with pain management.  Pain was thought to be particularly worse this past week.  Daughter discussed with patient's primary care provider who recommended bringing the patient to the emergency department.  MRI was considered however patient has IVC filter in place and as a result MRI could not be done since the filter material could not be verified.  Apparently this filter was placed about 25 to 30 years ago at outside facility.  Patient was hospitalized for pain management.  Started on steroids.  Back pain appears to be improving.  Consultants: Phone discussion with neurosurgery by admitting team/EDP  Procedures: None    Subjective: Patient very hard of hearing but does mention that his back pain is improving.  Denies any other complaints including shortness of breath or chest pain.   Assessment/Plan:   * Intractable back pain- (present on admission) Patient with longstanding history of chronic back pain.  Has been on opioid pain medications.  Follows with pain management clinic.  Was brought in because of significant worsening in the pain.  No history of any falls or injuries recently.  No neurological deficits noted on examination.   MRI was initially ordered but could not be done since he has metal IVC filter.  This filter was apparently placed in outside facility more than 20 years ago.   I  did try to call his cardiologist in [place] but have not heard back.  I also called [facility] in [place] where apparently the filter was placed and the radiology department does not have any records of the type of IVC that was placed.  So with this limited information we will not be able to do MRI in this patient.  Patient subsequently underwent CT of the lumbar spine which does suggest a significant spinal stenosis in the lumbar spine.  Findings were discussed with neurosurgery.   Pain likely from significant spinal stenosis and lumbar area.  No role for any operative intervention currently.   After discussions with his daughter and since likelihood of infection is low patient was started on steroids.  Pain appears to be improving.  Patient is able to mobilize better than before.  He mentions that he is feeling better.  Continue physical and Occupational Therapy.  Short-term rehab was recommended.  Continue IV steroids for another 24 hours and then transition to oral and start tapering from tomorrow.  Hyperglycemia due to type 2 diabetes mellitus ([place])- (present on admission) HbA1c 6.6.  Prior to admission he was on Lantus.  It was held initially due to low glucose levels presumably from poor oral intake. Subsequently noted to have hyperglycemia from steroids.  Started back on Lantus.  Remains on sliding scale coverage. CBGs better compared to yesterday but still high.  Will increase dose of Lantus.    CKD (chronic kidney disease) stage 4, GFR 15-29 ml/min (HCC)- (present on admission) Acute Kidney Injury Hyperkalemia Hyponatremia  Baseline renal function noted to be between 1.8-2.1.  Creatinine noted to be significantly higher yesterday at 2.67 with elevated BUN.  Elevated BUN likely from steroids.  He has had poor oral intake for several days according to his daughter.  Likely dehydrated.  Started on IV fluids with improvement in creatinine today at 2.1.  Monitor urine output.    Potassium remains elevated.  We will give him 2 doses of Lokelma today.  Recheck labs tomorrow. Sodium level noted to be lower today.  Again this is thought to be from hypovolemia.  Even though he does have a history of systolic CHF there is no evidence for any fluid overload at this time as his weight is stable.  We will check a urine osmolality urine sodium and creatinine levels.    Hypothyroid- (present on admission) Continue levothyroxine.  Chronic systolic CHF (congestive heart failure) ([place])- (present on admission) Last echo in our system in 2021 showed EF less than 20%, G1 DD, RV function mildly reduced. Weight has been stable.  Has had very poor oral intake.  His furosemide was held due to worsening renal function and hyponatremia.  Essential hypertension- (present on admission) Patient daughter informed me that he is no longer on losartan at home.  This has been discontinued. Blood pressure noted to be borderline low but stable.  Dementia ([place])- (present on admission) Stable.  Continue home medication  Peripheral vascular disease ([place])- (present on admission) Stable  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- (present on admission) Stable  Moderate recurrent major depression ([place])- (present on admission) Noted to be on Lexapro and trazodone.  Thrombocytopenia ([place])- (present on admission) Leukocytosis Normocytic anemia  Counts are low but stable.  Platelet count noted to be slightly lower.  He is on Lovenox.  We will hold it for now.  SCDs.      DVT Prophylaxis: SCDs Code Status: DNR Family Communication: Daughter being updated at least every other day. Disposition Plan: SNF  Status is: Inpatient  Remains inpatient appropriate because: Intractable back pain, ambulatory dysfunction   Planned Discharge Destination: SNF          Medications: Scheduled:  aspirin EC  81 mg Oral Daily   colestipol  1 g Oral QODAY    enoxaparin (LOVENOX) injection  30 mg Subcutaneous Daily   escitalopram  10 mg Oral Daily   insulin aspart  0-20 Units Subcutaneous TID WC   insulin aspart  0-5 Units Subcutaneous QHS   insulin aspart  5 Units Subcutaneous TID WC   insulin glargine-yfgn  20 Units Subcutaneous Daily   levothyroxine  100 mcg Oral QAC breakfast   lidocaine  1 patch Transdermal Q24H   lipase/protease/amylase  36,000 Units Oral TID AC   methylPREDNISolone (SOLU-MEDROL) injection  40 mg Intravenous Q12H   polyethylene glycol  17 g Oral Daily   sodium chloride flush  3 mL Intravenous Q12H   sodium zirconium cyclosilicate  10 g Oral TID   traZODone  25 mg Oral QHS   Continuous:  sodium chloride 75 mL/hr at 04/22/21 0849   YIR:[id number] **OR** acetaminophen, HYDROcodone-acetaminophen, HYDROmorphone (DILAUDID) injection, loperamide, polyethylene glycol, senna  Antibiotics: Anti-infectives (From admission, onward)    None       Objective:  Vital Signs  Vitals:   04/21/21 2300 04/22/21 0336 04/22/21 0500 04/22/21 0757  BP: 106/71 (!) 117/93  119/79  Pulse: 66 66  74  Resp: 17 10  13   Temp: (!) 97.3 F (36.3 C) (!) 97 F (36.1 C)  (!) 97 F (36.1  C)  TempSrc: Axillary Oral  Axillary  SpO2: 96% 95%  97%  Weight:   75.3 kg   Height:        Intake/Output Summary (Last 24 hours) at 04/22/2021 0908 Last data filed at 04/22/2021 0300 Gross per 24 hour  Intake 1176.75 ml  Output 901 ml  Net 275.75 ml    Filed Weights   04/20/21 0345 04/21/21 0414 04/22/21 0500  Weight: 71.6 kg 73.8 kg 75.3 kg    General appearance: Awake alert.  In no distress.  Hard of hearing.  Sitting up on his bed eating breakfast. Resp: Clear to auscultation bilaterally.  Normal effort Cardio: S1-S2 is normal regular.  No S3-S4.  No rubs murmurs or bruit GI: Abdomen is soft.  Nontender nondistended.  Bowel sounds are present normal.  No masses organomegaly Extremities: No edema.  Able to move both of his lower  extremities Neurologic:  No focal neurological deficits.     Lab Results:  Data Reviewed: I have personally reviewed labs and imaging study reports  CBC: Recent Labs  Lab 04/18/21 1653 04/19/21 0435 04/20/21 0145 04/21/21 0337 04/22/21 0417  WBC 12.9* 12.6* 12.2* 12.2* 8.6  NEUTROABS 9.9*  --   --   --   --   HGB 12.5* 12.5* 12.2* 11.5* 11.3*  HCT 37.8* 38.0* 36.0* 32.7* 32.2*  MCV 99.0 100.0 97.6 95.6 93.9  PLT 104* 102* 91* 91* 76*     Basic Metabolic Panel: Recent Labs  Lab 04/18/21 1653 04/19/21 0435 04/20/21 0145 04/21/21 0337 04/22/21 0417  NA 138 138 133* 128* 126*  K 4.5 4.3 5.2* 5.2* 5.3*  CL 104 105 98 95* 97*  CO2 25 22 23  21* 18*  GLUCOSE 56* 94 235* 275* 180*  BUN 40* 40* 49* 89* 99*  CREATININE 2.04* 1.90* 2.05* 2.67* 2.21*  CALCIUM 8.7* 8.5* 8.5* 8.3* 8.0*     GFR: Estimated Creatinine Clearance: 16.4 mL/min (A) (by C-G formula based on SCr of 2.21 mg/dL (H)).  Liver Function Tests: Recent Labs  Lab 04/18/21 1653 04/19/21 0435  AST 22 19  ALT 17 15  ALKPHOS 145* 131*  BILITOT 0.8 0.7  PROT 6.8 6.3*  ALBUMIN 3.0* 2.7*       HbA1C: No results for input(s): HGBA1C in the last 72 hours.   CBG: Recent Labs  Lab 04/21/21 0740 04/21/21 1132 04/21/21 1655 04/21/21 2013 04/22/21 0753  GLUCAP 306* 353* 290* 267* 227*       Radiology Studies: No results found.     LOS: 2 days   [facility] Pager on [email or web address]  04/22/2021, 9:08 AM

## 2021-04-23 DIAGNOSIS — E1165 Type 2 diabetes mellitus with hyperglycemia: Secondary | ICD-10-CM | POA: Diagnosis not present

## 2021-04-23 DIAGNOSIS — I10 Essential (primary) hypertension: Secondary | ICD-10-CM | POA: Diagnosis not present

## 2021-04-23 DIAGNOSIS — N179 Acute kidney failure, unspecified: Secondary | ICD-10-CM | POA: Diagnosis not present

## 2021-04-23 DIAGNOSIS — M549 Dorsalgia, unspecified: Secondary | ICD-10-CM | POA: Diagnosis not present

## 2021-04-23 LAB — CBC
HCT: 34.6 % — ABNORMAL LOW (ref 39.0–52.0)
Hemoglobin: 11.7 g/dL — ABNORMAL LOW (ref 13.0–17.0)
MCH: 32.2 pg (ref 26.0–34.0)
MCHC: 33.8 g/dL (ref 30.0–36.0)
MCV: 95.3 fL (ref 80.0–100.0)
Platelets: 97 10*3/uL — ABNORMAL LOW (ref 150–400)
RBC: 3.63 MIL/uL — ABNORMAL LOW (ref 4.22–5.81)
RDW: 13.2 % (ref 11.5–15.5)
WBC: 9.3 10*3/uL (ref 4.0–10.5)
nRBC: 0 % (ref 0.0–0.2)

## 2021-04-23 LAB — BASIC METABOLIC PANEL
Anion gap: 11 (ref 5–15)
BUN: 97 mg/dL — ABNORMAL HIGH (ref 8–23)
CO2: 21 mmol/L — ABNORMAL LOW (ref 22–32)
Calcium: 8.2 mg/dL — ABNORMAL LOW (ref 8.9–10.3)
Chloride: 96 mmol/L — ABNORMAL LOW (ref 98–111)
Creatinine, Ser: 2.29 mg/dL — ABNORMAL HIGH (ref 0.61–1.24)
GFR, Estimated: 25 mL/min — ABNORMAL LOW (ref >60)
Glucose, Bld: 159 mg/dL — ABNORMAL HIGH (ref 70–99)
Potassium: 4.4 mmol/L (ref 3.5–5.1)
Sodium: 128 mmol/L — ABNORMAL LOW (ref 135–145)

## 2021-04-23 LAB — GLUCOSE, CAPILLARY
Glucose-Capillary: 175 mg/dL — ABNORMAL HIGH (ref 70–99)
Glucose-Capillary: 272 mg/dL — ABNORMAL HIGH (ref 70–99)
Glucose-Capillary: 251 mg/dL — ABNORMAL HIGH (ref 70–99)
Glucose-Capillary: 207 mg/dL — ABNORMAL HIGH (ref 70–99)

## 2021-04-23 MED ORDER — PREDNISONE 20 MG PO TABS
60.0000 mg | ORAL_TABLET | Freq: Every day | ORAL | Status: DC
  Administered 2021-04-24 – 2021-04-27 (×4): 60 mg via ORAL
  Filled 2021-04-23 (×4): qty 3

## 2021-04-23 MED ORDER — SODIUM CHLORIDE 0.9 % IV SOLN: INTRAVENOUS | Status: DC

## 2021-04-23 NOTE — Progress Notes (Signed)
[facility] PROGRESS NOTE   Troy Hicks VPX:[id number] DOB: [birth year] DOA: 04/18/2021  3 DOS: the patient was seen and examined on 04/23/2021  PCP: [name], FNP  Brief History and Hospital Course:  88 y.o. male with medical history significant of diabetes, high thrombocytopenia, hyperlipidemia, peripheral vascular disease, atrial fibrillation, depression, hypothyroidism, CHF, gout, glaucoma, hypertension, dysphagia, dementia, CKD 3 who presented with ongoing back pain.  Patient has had chronic back pain for a very long time and over the last 6 months has had difficulty walking.  Follows with pain management.  Pain was thought to be particularly worse this past week.  Daughter discussed with patient's primary care provider who recommended bringing the patient to the emergency department.  MRI was considered however patient has IVC filter in place and as a result MRI could not be done since the filter material could not be verified.  Apparently this filter was placed about 25 to 30 years ago at outside facility.  Patient was hospitalized for pain management.  Started on steroids.  Back pain has improved.  Consultants: Phone discussion with neurosurgery by admitting team/EDP  Procedures: None    Subjective: Patient sitting up on the bed eating his breakfast.  Mentions that his back pain is better.  No other complaints offered.      Assessment/Plan:   * Intractable back pain- (present on admission) Patient with longstanding history of chronic back pain.  Has been on opioid pain medications.  Follows with pain management clinic.  Was brought in because of significant worsening in the pain.  No history of any falls or injuries recently.  No neurological deficits noted on examination.   MRI was initially ordered but could not be done since he has metal IVC filter.  This filter was apparently placed in outside facility more than 20 years ago.   I did try to call his cardiologist in  [place] but have not heard back.  I also called [facility] in [place] where apparently the filter was placed and the radiology department does not have any records of the type of IVC that was placed.  So with this limited information we will not be able to do MRI in this patient.  Patient subsequently underwent CT of the lumbar spine which does suggest a significant spinal stenosis in the lumbar spine.  Findings were discussed with neurosurgery.   Pain likely from significant spinal stenosis and lumbar area.  No role for any operative intervention currently.   After discussions with his daughter and since likelihood of infection is low patient was started on steroids.  Patient's pain has significantly improved compared to initial presentation.  He is more comfortable sitting upright.  Not constantly complaining of pain.  Mobility appears to have improved as well.  Continue PT and OT.  Short-term rehab was recommended.  Will change to oral steroids today.  Hyperglycemia due to type 2 diabetes mellitus ([place])- (present on admission) HbA1c 6.6.  Prior to admission he was on Lantus.  It was held initially due to low glucose levels presumably from poor oral intake. Subsequently noted to have hyperglycemia from steroids.  Started back on Lantus.  Remains on sliding scale coverage. CBGs are better.  Should improve as steroid is tapered down.  CKD (chronic kidney disease) stage 4, GFR 15-29 ml/min (HCC)- (present on admission) Acute Kidney Injury Hyperkalemia Hyponatremia  Baseline renal function noted to be between 1.8-2.1.   Creatinine did climb up to 2.67 with elevated BUN.  Elevated BUN likely from steroids. Patient has had poor oral intake for several days due to his pain issues.  He was likely dehydrated.  Started on IV fluids with improvement in renal function.  Monitor urine output. Potassium level improved with Lokelma. Sodium level noted to be better today.  Urine sodium was  less than 10, urine osmolality 319.  Thought to be secondary to hypovolemia.  Continue with normal saline infusion.  Sodium up to 128 today.  Recheck tomorrow.  Caution with fluids considering history of CHF.  No evidence for fluid overload currently.  Weight has been stable.  Hypothyroid- (present on admission) Continue levothyroxine.  Chronic systolic CHF (congestive heart failure) ([place])- (present on admission) Last echo in our system in 2021 showed EF less than 20%, G1 DD, RV function mildly reduced. Weight has been stable.  Has had very poor oral intake.  His furosemide was held due to worsening renal function and hyponatremia.  Essential hypertension- (present on admission) Patient daughter informed me that he is no longer on losartan at home.  This has been discontinued. Blood pressure stable for the most part.  Dementia ([place])- (present on admission) Stable.  Continue home medication  Peripheral vascular disease ([place])- (present on admission) Stable  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- (present on admission) Stable  Moderate recurrent major depression ([place])- (present on admission) Noted to be on Lexapro and trazodone.  Thrombocytopenia ([place])- (present on admission) Leukocytosis Normocytic anemia  Counts are low but stable.  Platelet count noted to be better today at 97,000.  Lovenox remains on hold.  SCDs.      DVT Prophylaxis: SCDs Code Status: DNR Family Communication: Daughter being updated at least every other day. Disposition Plan: SNF  Status is: Inpatient  Remains inpatient appropriate because: Intractable back pain, ambulatory dysfunction   Planned Discharge Destination: SNF       Medications: Scheduled:  acetaminophen  650 mg Oral TID   aspirin EC  81 mg Oral Daily   colestipol  1 g Oral QODAY   escitalopram  10 mg Oral Daily   insulin aspart  0-20 Units Subcutaneous TID WC   insulin aspart  0-5 Units  Subcutaneous QHS   insulin aspart  5 Units Subcutaneous TID WC   insulin glargine-yfgn  25 Units Subcutaneous Daily   insulin glargine-yfgn  5 Units Subcutaneous Once   levothyroxine  100 mcg Oral QAC breakfast   lidocaine  1 patch Transdermal Q24H   lipase/protease/amylase  36,000 Units Oral TID AC   methylPREDNISolone (SOLU-MEDROL) injection  40 mg Intravenous Q12H   polyethylene glycol  17 g Oral Daily   sodium chloride flush  3 mL Intravenous Q12H   traZODone  25 mg Oral QHS   Continuous:  sodium chloride 50 mL/hr at 04/23/21 0753   GNF:[id number] **OR** acetaminophen, HYDROcodone-acetaminophen, HYDROmorphone (DILAUDID) injection, loperamide, polyethylene glycol, senna  Antibiotics: Anti-infectives (From admission, onward)    None       Objective:  Vital Signs  Vitals:   04/22/21 1952 04/22/21 2300 04/23/21 0345 04/23/21 0735  BP: (!) 116/54 139/68 (!) 145/78 (!) 119/96  Pulse: 72 67 77 63  Resp:  (!) 23 (!) 21 (!) 21  Temp: 97.6 F (36.4 C) (!) 97.5 F (36.4 C) (!) 97.2 F (36.2 C) 97.9 F (36.6 C)  TempSrc: Oral Oral Axillary Axillary  SpO2: 94% 95%  97%  Weight:      Height:        Intake/Output Summary (Last  24 hours) at 04/23/2021 1047 Last data filed at 04/23/2021 0647 Gross per 24 hour  Intake 1825.17 ml  Output 2500 ml  Net -674.83 ml    Filed Weights   04/20/21 0345 04/21/21 0414 04/22/21 0500  Weight: 71.6 kg 73.8 kg 75.3 kg    General appearance: Awake alert.  In no distress.  Hard of hearing Resp: Clear to auscultation bilaterally.  Normal effort Cardio: S1-S2 is normal regular.  No S3-S4.  No rubs murmurs or bruit GI: Abdomen is soft.  Nontender nondistended.  Bowel sounds are present normal.  No masses organomegaly Extremities: No edema.   Neurologic:  No focal neurological deficits.      Lab Results:  Data Reviewed: I have personally reviewed labs and imaging study reports  CBC: Recent Labs  Lab 04/18/21 1653 04/19/21 0435  04/20/21 0145 04/21/21 0337 04/22/21 0417 04/23/21 0213  WBC 12.9* 12.6* 12.2* 12.2* 8.6 9.3  NEUTROABS 9.9*  --   --   --   --   --   HGB 12.5* 12.5* 12.2* 11.5* 11.3* 11.7*  HCT 37.8* 38.0* 36.0* 32.7* 32.2* 34.6*  MCV 99.0 100.0 97.6 95.6 93.9 95.3  PLT 104* 102* 91* 91* 76* 97*     Basic Metabolic Panel: Recent Labs  Lab 04/19/21 0435 04/20/21 0145 04/21/21 0337 04/22/21 0417 04/23/21 0213  NA 138 133* 128* 126* 128*  K 4.3 5.2* 5.2* 5.3* 4.4  CL 105 98 95* 97* 96*  CO2 22 23 21* 18* 21*  GLUCOSE 94 235* 275* 180* 159*  BUN 40* 49* 89* 99* 97*  CREATININE 1.90* 2.05* 2.67* 2.21* 2.29*  CALCIUM 8.5* 8.5* 8.3* 8.0* 8.2*     GFR: Estimated Creatinine Clearance: 15.9 mL/min (A) (by C-G formula based on SCr of 2.29 mg/dL (H)).  Liver Function Tests: Recent Labs  Lab 04/18/21 1653 04/19/21 0435  AST 22 19  ALT 17 15  ALKPHOS 145* 131*  BILITOT 0.8 0.7  PROT 6.8 6.3*  ALBUMIN 3.0* 2.7*       HbA1C: No results for input(s): HGBA1C in the last 72 hours.   CBG: Recent Labs  Lab 04/22/21 0753 04/22/21 1149 04/22/21 1543 04/22/21 2108 04/23/21 0812  GLUCAP 227* 318* 283* 259* 175*       Radiology Studies: No results found.     LOS: 3 days   [employer] on [email or web address]  04/23/2021, 10:47 AM

## 2021-04-23 NOTE — Progress Notes (Signed)
Telemetry called RN at 0014 regarding 6 beats of wide QRS complexes.

## 2021-04-24 DIAGNOSIS — N179 Acute kidney failure, unspecified: Secondary | ICD-10-CM | POA: Diagnosis not present

## 2021-04-24 DIAGNOSIS — E1165 Type 2 diabetes mellitus with hyperglycemia: Secondary | ICD-10-CM | POA: Diagnosis not present

## 2021-04-24 DIAGNOSIS — M549 Dorsalgia, unspecified: Secondary | ICD-10-CM | POA: Diagnosis not present

## 2021-04-24 DIAGNOSIS — I10 Essential (primary) hypertension: Secondary | ICD-10-CM | POA: Diagnosis not present

## 2021-04-24 LAB — BASIC METABOLIC PANEL
Anion gap: 9 (ref 5–15)
BUN: 94 mg/dL — ABNORMAL HIGH (ref 8–23)
CO2: 21 mmol/L — ABNORMAL LOW (ref 22–32)
Calcium: 7.8 mg/dL — ABNORMAL LOW (ref 8.9–10.3)
Chloride: 102 mmol/L (ref 98–111)
Creatinine, Ser: 2.16 mg/dL — ABNORMAL HIGH (ref 0.61–1.24)
GFR, Estimated: 27 mL/min — ABNORMAL LOW (ref >60)
Glucose, Bld: 43 mg/dL — CL (ref 70–99)
Potassium: 4.1 mmol/L (ref 3.5–5.1)
Sodium: 132 mmol/L — ABNORMAL LOW (ref 135–145)

## 2021-04-24 LAB — CBC
HCT: 32.6 % — ABNORMAL LOW (ref 39.0–52.0)
Hemoglobin: 11.5 g/dL — ABNORMAL LOW (ref 13.0–17.0)
MCH: 33.1 pg (ref 26.0–34.0)
MCHC: 35.3 g/dL (ref 30.0–36.0)
MCV: 93.9 fL (ref 80.0–100.0)
Platelets: 112 10*3/uL — ABNORMAL LOW (ref 150–400)
RBC: 3.47 MIL/uL — ABNORMAL LOW (ref 4.22–5.81)
RDW: 13.2 % (ref 11.5–15.5)
WBC: 13.8 10*3/uL — ABNORMAL HIGH (ref 4.0–10.5)
nRBC: 0 % (ref 0.0–0.2)

## 2021-04-24 LAB — GLUCOSE, CAPILLARY
Glucose-Capillary: 34 mg/dL — CL (ref 70–99)
Glucose-Capillary: 49 mg/dL — ABNORMAL LOW (ref 70–99)
Glucose-Capillary: 52 mg/dL — ABNORMAL LOW (ref 70–99)
Glucose-Capillary: 81 mg/dL (ref 70–99)
Glucose-Capillary: 137 mg/dL — ABNORMAL HIGH (ref 70–99)
Glucose-Capillary: 267 mg/dL — ABNORMAL HIGH (ref 70–99)
Glucose-Capillary: 321 mg/dL — ABNORMAL HIGH (ref 70–99)
Glucose-Capillary: 240 mg/dL — ABNORMAL HIGH (ref 70–99)

## 2021-04-24 MED ORDER — INSULIN GLARGINE-YFGN 100 UNIT/ML ~~LOC~~ SOLN
20.0000 [IU] | Freq: Every day | SUBCUTANEOUS | Status: DC
  Administered 2021-04-25 – 2021-04-27 (×3): 20 [IU] via SUBCUTANEOUS
  Filled 2021-04-24 (×3): qty 0.2

## 2021-04-24 NOTE — Progress Notes (Signed)
Occupational Therapy Treatment Patient Details Name: Troy Hicks MRN: [id number] DOB: May 08, 1919 Today's Date: 04/24/2021   History of present illness 88 y.o. male with medical history significant of diabetes, high thrombocytopenia, hyperlipidemia, peripheral vascular disease, atrial fibrillation, depression, hypothyroidism, CHF, gout, glaucoma, hypertension, dysphagia, dementia, CKD 3 who presented with ongoing back pain.   OT comments  OT treatment session with focus on self-care re-education, ADL transfers and short-distance mobility with RW. Patient reporting 1/10 pain at rest. No increased pain reported with activity. Patient progressed from supine to sink surface for grooming in sitting/standing. Session concluded with patient seated in recliner. Patient continues to be limited by deficits listed below requiring Mod A grossly for ADLs and Min to Mod A for functional transfers with use of AD and would benefit from continued acute OT services in prep for d/c to next level of care.    Recommendations for follow up therapy are one component of a multi-disciplinary discharge planning process, led by the attending physician.  Recommendations may be updated based on patient status, additional functional criteria and insurance authorization.    Follow Up Recommendations  Skilled nursing-short term rehab (<3 hours/day)    Assistance Recommended at Discharge Frequent or constant Supervision/Assistance  Patient can return home with the following  A lot of help with walking and/or transfers;A lot of help with bathing/dressing/bathroom;Assist for transportation;Assistance with cooking/housework   Equipment Recommendations  BSC/3in1    Recommendations for Other Services      Precautions / Restrictions Precautions Precautions: Fall Restrictions Weight Bearing Restrictions: No       Mobility Bed Mobility Overal bed mobility: Needs Assistance Bed Mobility: Rolling, Sidelying to Sit Rolling:  Min assist       Sit to sidelying: Mod assist General bed mobility comments: Cues for hand placement and Min A to roll to R. Min-Mod A to elevate trunk with HOB elevated.    Transfers Overall transfer level: Needs assistance Equipment used: Rolling walker (2 wheels) Transfers: Sit to/from Stand Sit to Stand: Mod assist     Step pivot transfers: Min assist     General transfer comment: Sit to stand from elevated surfaces with Min A and Mod A from low surfaces. Increaed time to rise. Requires heavy cues for hand placement on RW and on armrests prior to sitting.     Balance Overall balance assessment: Needs assistance Sitting-balance support: Feet supported Sitting balance-Leahy Scale: Fair     Standing balance support: Bilateral upper extremity supported, Reliant on assistive device for balance Standing balance-Leahy Scale: Poor Standing balance comment: Reliant on external assist and BUE supported on RW.                           ADL either performed or assessed with clinical judgement   ADL Overall ADL's : Needs assistance/impaired     Grooming: Minimal assistance;Standing Grooming Details (indicate cue type and reason): Completed oral hygiene standing at sink level. Min A for balance/safety. Face washing completed seated at sink level 2/2 fatigue.                 Toilet Transfer: Minimal assistance;Rolling walker (2 wheels) Toilet Transfer Details (indicate cue type and reason): Simulated with transfer to recliner. Cues for hand placement and safety with RW.           General ADL Comments: Requires repeat cues for proximity/safety/hand placement with RW.    Extremity/Trunk Assessment  Vision       Perception     Praxis      Cognition Arousal/Alertness: Awake/alert Behavior During Therapy: WFL for tasks assessed/performed Overall Cognitive Status: History of cognitive impairments - at baseline                                  General Comments: decreased STM; very HOH        Exercises      Shoulder Instructions       General Comments 2L O2 via [place] at rest. SpO2 91% on 2L with activity and poor pleth. At rest SpO2 increased to 97% on 2L.    Pertinent Vitals/ Pain       Pain Assessment Pain Assessment: 0-10 Pain Score: 1  Pain Location: Low back Pain Descriptors / Indicators: Aching, Discomfort Pain Intervention(s): Monitored during session, Repositioned  Home Living                                          Prior Functioning/Environment              Frequency  Min 2X/week        Progress Toward Goals  OT Goals(current goals can now be found in the care plan section)  Progress towards OT goals: Progressing toward goals  Acute Rehab OT Goals Patient Stated Goal: "I would love to brush my teeth". OT Goal Formulation: With patient Time For Goal Achievement: 05/04/21 Potential to Achieve Goals: Good ADL Goals Pt Will Perform Grooming: with supervision;sitting Pt Will Perform Lower Body Bathing: with supervision;sit to/from stand Pt Will Perform Lower Body Dressing: with supervision;sit to/from stand Pt Will Transfer to Toilet: with supervision;ambulating;regular height toilet  Plan Discharge plan remains appropriate;Frequency remains appropriate    Co-evaluation                 AM-PAC OT "6 Clicks" Daily Activity     Outcome Measure   Help from another person eating meals?: None Help from another person taking care of personal grooming?: A Little Help from another person toileting, which includes using toliet, bedpan, or urinal?: A Lot Help from another person bathing (including washing, rinsing, drying)?: A Lot Help from another person to put on and taking off regular upper body clothing?: A Little Help from another person to put on and taking off regular lower body clothing?: A Lot 6 Click Score: 16    End of Session Equipment  Utilized During Treatment: Rolling walker (2 wheels);Gait belt;Oxygen  OT Visit Diagnosis: Pain;Unsteadiness on feet (R26.81);Muscle weakness (generalized) (M62.81)   Activity Tolerance Patient tolerated treatment well   Patient Left in chair;with call bell/phone within reach;with chair alarm set   Nurse Communication Mobility status        Time: 1010-1043 OT Time Calculation (min): 33 min  Charges: OT General Charges $OT Visit: 1 Visit OT Treatments $Self Care/Home Management : 23-37 mins  Nesta Scaturro H. OTR/L Supplemental OT, Department of rehab services [phone]  Ralphael Southgate R H. 04/24/2021, 10:52 AM

## 2021-04-24 NOTE — TOC Initial Note (Signed)
Transition of Care [facility]) - Initial/Assessment Note    Patient Details  Name: Troy Hicks MRN: [id number] Date of Birth: Nov 18, 1919  Transition of Care [facility]) CM/SW Contact:    Vinie Sill, LCSW Phone Number: 04/24/2021, 3:16 PM  Clinical Narrative:                  CSW received message from attending to contact patient's daughter, [name] CSW spoke with patient's daughter, introduced self and explained role. She confirmed the patient is from [place] and wanted him to return to ALF.However, after learning more about therapy recommendations, she doesn't believe [facility] will be able to manage his care. She states she is agreeable to short term rehab at SNF, in hopes the patient will get stronger and can return to ALF. CSW explained the SNF process. No preferred SNF at this time.  CSW will provide bed offers once available  CSW will continue to follow and assist wifh discharge planning.  Thurmond Butts, MSW, LCSW Clinical Social Worker    Expected Discharge Plan: Skilled Nursing Facility Barriers to Discharge: Ship broker, Continued Medical Work up, SNF Pending bed offer   Patient Goals and CMS Choice        Expected Discharge Plan and Services Expected Discharge Plan: [place] In-house Referral: Clinical Social Work     Living arrangements for the past 2 months: [place]                                      Prior Living Arrangements/Services Living arrangements for the past 2 months: [place] Lives with:: Self, Facility Resident Patient language and need for interpreter reviewed:: No        Need for Family Participation in Patient Care: Yes (Comment) Care giver support system in place?: Yes (comment)   Criminal Activity/Legal Involvement Pertinent to Current Situation/Hospitalization: No - Comment as needed  Activities of Daily Living Home Assistive Devices/Equipment: Wheelchair ADL  Screening (condition at time of admission) Patient's cognitive ability adequate to safely complete daily activities?: Yes Is the patient deaf or have difficulty hearing?: Yes Does the patient have difficulty seeing, even when wearing glasses/contacts?: Yes Does the patient have difficulty concentrating, remembering, or making decisions?: Yes Patient able to express need for assistance with ADLs?: Yes Does the patient have difficulty dressing or bathing?: Yes Independently performs ADLs?: No Communication: Independent Dressing (OT): Needs assistance Is this a change from baseline?: Pre-admission baseline Grooming: Needs assistance Is this a change from baseline?: Pre-admission baseline Feeding: Independent Bathing: Needs assistance Is this a change from baseline?: Pre-admission baseline Toileting: Independent In/Out Bed: Needs assistance Is this a change from baseline?: Pre-admission baseline Walks in Home: Dependent Is this a change from baseline?: Pre-admission baseline Does the patient have difficulty walking or climbing stairs?: Yes Weakness of Legs: Both Weakness of Arms/Hands: Both  Permission Sought/Granted      Share Information with NAME: [place] granted to share info w AGENCY: SNFs  Permission granted to share info w Relationship: daughter  Permission granted to share info w Contact Information: [phone]  Emotional Assessment       Orientation: : Oriented to Self, Oriented to Place, Oriented to  Time, Oriented to Situation Alcohol / Substance Use: Not Applicable Psych Involvement: No (comment)  Admission diagnosis:  Lumbar radiculopathy [M54.16] Intractable back pain [M54.9] Patient Active Problem List   Diagnosis Date Noted  Chronic systolic CHF (congestive heart failure) ([place]) 04/21/2021   CKD (chronic kidney disease) stage 4, GFR 15-29 ml/min (HCC) 04/21/2021   Generalized anxiety disorder 04/19/2021   Recurrent major depression  in remission ([place]) 04/19/2021   Intractable back pain 04/18/2021   Epidermal cyst 10/05/2020   Abnormal gait 06/15/2020   Abrasion of right hand 06/15/2020   Actinic keratosis 06/15/2020   Adjustment disorder with depressed mood 06/15/2020   Arthropathy of pelvic region and thigh 06/15/2020   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 06/15/2020   Bilateral pseudophakia 06/15/2020   Body mass index (BMI) 31.0-31.9, adult 06/15/2020   Cellulitis and abscess of trunk 06/15/2020   Chronic diarrhea of unknown origin 06/15/2020   Chronic diastolic heart failure ([place]) 06/15/2020   Chronic pain 06/15/2020   Dementia ([place]) 06/15/2020   Diabetic renal disease ([place]) 06/15/2020   Edema 06/15/2020   Esophageal dysphagia 06/15/2020   Foreign body in ear 06/15/2020   Fecal smearing 06/15/2020   Generalized osteoarthritis 06/15/2020   Glaucoma 06/15/2020   Heart disease 06/15/2020   Peripheral vascular disease ([place]) 06/15/2020   History of colonic polyps 06/15/2020   History of endocrine disorder 06/15/2020   History of fall 06/15/2020   Hyperglycemia due to type 2 diabetes mellitus ([place]) 06/15/2020   Hyperlipidemia 06/15/2020   Hypothyroid 06/15/2020   Long term (current) use of insulin ([place]) 06/15/2020   Low back pain 06/15/2020   Lumbosacral spondylosis without myelopathy 06/15/2020   Moderate recurrent major depression ([place]) 06/15/2020   Morbid obesity ([place]) 06/15/2020   Nonexudative age-related macular degeneration 06/15/2020   Orthostatic hypotension 06/15/2020   Other ill-defined and unknown causes of morbidity and mortality 06/15/2020   Other psoriasis 06/15/2020   Paroxysmal atrial fibrillation ([place]) 06/15/2020   Patient's noncompliance with dietary regimen 06/15/2020   Gout 06/15/2020   Insomnia 06/15/2020   Psychosexual dysfunction with inhibited sexual excitement 06/15/2020   Rectal prolapse 06/15/2020   Recurrent falls 06/15/2020   Chronic  kidney disease due to hypertension 06/15/2020   Type 2 diabetes mellitus with other diabetic kidney complication ([place]) 94/85/4627   Abdominal aortic atherosclerosis ([place]) 05/16/2020   Degeneration of lumbar intervertebral disc 05/16/2020   Essential hypertension 05/16/2020   Lumbar spondylosis 05/16/2020   Thrombocytopenia ([place]) 07/21/2019   Perianal abscess 05/14/2019   Diarrhea 05/14/2019   Osteoarthritis of elbow 11/26/2018   Osteoarthritis of glenohumeral joint 11/26/2018   Pain in joint of left shoulder 11/25/2018   Pain in joint of right shoulder 11/25/2018   Pain in elbow 11/25/2018   Shortness of breath 04/10/2018   Ischemic cardiomyopathy 04/10/2018   PCP:  [name], FNP Pharmacy:  No Pharmacies Listed    Social Determinants of Health (SDOH) Interventions    Readmission Risk Interventions No flowsheet data found.

## 2021-04-24 NOTE — Progress Notes (Signed)
Date and time results received: 04/24/21 0547   Test: CBG Critical Value: 64  Name of Provider Notified: Dr. Kennon Holter  Actions Taken? Gave pt an ensure and peanut butter cracker. CBG is now 81.

## 2021-04-24 NOTE — Care Management Important Message (Signed)
Important Message  Patient Details  Name: Troy Hicks MRN: [id number] Date of Birth: 1920/02/22   Medicare Important Message Given:  Yes     [name] 04/24/2021, 11:41 AM

## 2021-04-24 NOTE — NC FL2 (Signed)
[place] LEVEL OF CARE SCREENING TOOL     IDENTIFICATION  Patient Name: Troy Hicks Birthdate: 1919-07-02 Sex: male Admission Date (Current Location): 04/18/2021  [facility] and [place] Number:  Herbalist and Address:  [facility] [place], [place] [address] [place], [place] [address]      Provider Number: [id number]  Attending Physician Name and Address:  Bonnielee Haff, MD  Relative Name and Phone Number:       Current Level of Care: Hospital Recommended Level of Care: [place] Prior Approval Number:    Date Approved/Denied:   PASRR Number: [id number]  Discharge Plan: SNF    Current Diagnoses: Patient Active Problem List   Diagnosis Date Noted   Chronic systolic CHF (congestive heart failure) ([place]) 04/21/2021   CKD (chronic kidney disease) stage 4, GFR 15-29 ml/min ([place]) 04/21/2021   Generalized anxiety disorder 04/19/2021   Recurrent major depression in remission ([place]) 04/19/2021   Intractable back pain 04/18/2021   Epidermal cyst 10/05/2020   Abnormal gait 06/15/2020   Abrasion of right hand 06/15/2020   Actinic keratosis 06/15/2020   Adjustment disorder with depressed mood 06/15/2020   Arthropathy of pelvic region and thigh 06/15/2020   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 06/15/2020   Bilateral pseudophakia 06/15/2020   Body mass index (BMI) 31.0-31.9, adult 06/15/2020   Cellulitis and abscess of trunk 06/15/2020   Chronic diarrhea of unknown origin 06/15/2020   Chronic diastolic heart failure ([place]) 06/15/2020   Chronic pain 06/15/2020   Dementia ([place]) 06/15/2020   Diabetic renal disease ([place]) 06/15/2020   Edema 06/15/2020   Esophageal dysphagia 06/15/2020   Foreign body in ear 06/15/2020   Fecal smearing 06/15/2020   Generalized osteoarthritis 06/15/2020   Glaucoma 06/15/2020   Heart disease 06/15/2020   Peripheral vascular disease ([place]) 06/15/2020   History  of colonic polyps 06/15/2020   History of endocrine disorder 06/15/2020   History of fall 06/15/2020   Hyperglycemia due to type 2 diabetes mellitus ([place]) 06/15/2020   Hyperlipidemia 06/15/2020   Hypothyroid 06/15/2020   Long term (current) use of insulin ([place]) 06/15/2020   Low back pain 06/15/2020   Lumbosacral spondylosis without myelopathy 06/15/2020   Moderate recurrent major depression ([place]) 06/15/2020   Morbid obesity ([place]) 06/15/2020   Nonexudative age-related macular degeneration 06/15/2020   Orthostatic hypotension 06/15/2020   Other ill-defined and unknown causes of morbidity and mortality 06/15/2020   Other psoriasis 06/15/2020   Paroxysmal atrial fibrillation ([place]) 06/15/2020   Patient's noncompliance with dietary regimen 06/15/2020   Gout 06/15/2020   Insomnia 06/15/2020   Psychosexual dysfunction with inhibited sexual excitement 06/15/2020   Rectal prolapse 06/15/2020   Recurrent falls 06/15/2020   Chronic kidney disease due to hypertension 06/15/2020   Type 2 diabetes mellitus with other diabetic kidney complication ([place]) 25/63/8937   Abdominal aortic atherosclerosis ([place]) 05/16/2020   Degeneration of lumbar intervertebral disc 05/16/2020   Essential hypertension 05/16/2020   Lumbar spondylosis 05/16/2020   Thrombocytopenia ([place]) 07/21/2019   Perianal abscess 05/14/2019   Diarrhea 05/14/2019   Osteoarthritis of elbow 11/26/2018   Osteoarthritis of glenohumeral joint 11/26/2018   Pain in joint of left shoulder 11/25/2018   Pain in joint of right shoulder 11/25/2018   Pain in elbow 11/25/2018   Shortness of breath 04/10/2018   Ischemic cardiomyopathy 04/10/2018    Orientation RESPIRATION BLADDER Height & Weight     Self, Time, Situation, Place  O2 Continent Weight: 173 lb 15.1  oz (78.9 kg) Height:  5\' 6"  (167.6 cm)  BEHAVIORAL SYMPTOMS/MOOD NEUROLOGICAL BOWEL NUTRITION STATUS      Continent Diet (please see discharge summary)  AMBULATORY STATUS  COMMUNICATION OF NEEDS Skin   Supervision Verbally Normal                       Personal Care Assistance Level of Assistance  Bathing, Feeding, Dressing Bathing Assistance: Limited assistance Feeding assistance: Independent Dressing Assistance: Limited assistance     Functional Limitations Info  Sight, Hearing, Speech Sight Info: Impaired Hearing Info: Impaired Speech Info: Adequate    SPECIAL CARE FACTORS FREQUENCY  PT (By licensed PT), OT (By licensed OT)     PT Frequency: 5x per week OT Frequency: 5x per week            Contractures Contractures Info: Not present    Additional Factors Info  Code Status, Allergies Code Status Info: DNR Allergies Info: Codeine           Current Medications (04/24/2021):  This is the current hospital active medication list Current Facility-Administered Medications  Medication Dose Route Frequency Provider Last Rate Last Admin   acetaminophen (TYLENOL) tablet 650 mg  650 mg Oral Q6H PRN Marcelyn Bruins, MD       Or   acetaminophen (TYLENOL) suppository 650 mg  650 mg Rectal Q6H PRN Marcelyn Bruins, MD       acetaminophen (TYLENOL) tablet 650 mg  650 mg Oral TID Bonnielee Haff, MD   650 mg at 04/24/21 0908   aspirin EC tablet 81 mg  81 mg Oral Daily Marcelyn Bruins, MD   81 mg at 04/24/21 0908   colestipol (COLESTID) tablet 1 g  1 g Oral Threasa Heads, MD   1 g at 04/24/21 0908   escitalopram (LEXAPRO) tablet 10 mg  10 mg Oral Daily Marcelyn Bruins, MD   10 mg at 04/24/21 0908   HYDROcodone-acetaminophen (NORCO/VICODIN) 5-325 MG per tablet 1 tablet  1 tablet Oral Q6H PRN Marcelyn Bruins, MD   1 tablet at 04/24/21 0247   HYDROmorphone (DILAUDID) injection 0.5 mg  0.5 mg Intravenous Q4H PRN Marcelyn Bruins, MD   0.5 mg at 04/19/21 0246   insulin aspart (novoLOG) injection 0-20 Units  0-20 Units Subcutaneous TID WC Bonnielee Haff, MD   11 Units at 04/24/21 1214   [START ON 04/25/2021] insulin  glargine-yfgn (SEMGLEE) injection 20 Units  20 Units Subcutaneous Daily Bonnielee Haff, MD       levothyroxine (SYNTHROID) tablet 100 mcg  100 mcg Oral QAC breakfast Marcelyn Bruins, MD   100 mcg at 04/24/21 0908   lidocaine (LIDODERM) 5 % 1 patch  1 patch Transdermal Q24H Drenda Freeze, MD   1 patch at 04/23/21 2218   lipase/protease/amylase (CREON) capsule 36,000 Units  36,000 Units Oral TID Gevena Mart, MD   36,000 Units at 04/24/21 1214   loperamide (IMODIUM) capsule 2 mg  2 mg Oral PRN Marcelyn Bruins, MD       polyethylene glycol (MIRALAX / GLYCOLAX) packet 17 g  17 g Oral Daily PRN Marcelyn Bruins, MD       polyethylene glycol (MIRALAX / GLYCOLAX) packet 17 g  17 g Oral Daily Bonnielee Haff, MD   17 g at 04/24/21 0909   predniSONE (DELTASONE) tablet 60 mg  60 mg Oral Q breakfast Bonnielee Haff, MD   60 mg at 04/24/21 [phone]  senna (SENOKOT) tablet 17.2 mg  2 tablet Oral QHS PRN Marcelyn Bruins, MD       sodium chloride flush (NS) 0.9 % injection 3 mL  3 mL Intravenous Q12H Marcelyn Bruins, MD   3 mL at 04/24/21 4037   traZODone (DESYREL) tablet 25 mg  25 mg Oral QHS Marcelyn Bruins, MD   25 mg at 04/23/21 2218     Discharge Medications: Please see discharge summary for a list of discharge medications.  Relevant Imaging Results:  Relevant Lab Results:   Additional Information SSN [id number]  Vinie Sill, LCSW

## 2021-04-24 NOTE — Progress Notes (Signed)
[facility] PROGRESS NOTE   Troy Hicks ZCH:[id number] DOB: 1919/12/28 DOA: 04/18/2021  4 DOS: the patient was seen and examined on 04/24/2021  PCP: [name], FNP  Brief History and Hospital Course:  88 y.o. male with medical history significant of diabetes, high thrombocytopenia, hyperlipidemia, peripheral vascular disease, atrial fibrillation, depression, hypothyroidism, CHF, gout, glaucoma, hypertension, dysphagia, dementia, CKD 3 who presented with ongoing back pain.  Patient has had chronic back pain for a very long time and over the last 6 months has had difficulty walking.  Follows with pain management.  Pain was thought to be particularly worse this past week.  Daughter discussed with patient's primary care provider who recommended bringing the patient to the emergency department.  MRI was considered however patient has IVC filter in place and as a result MRI could not be done since the filter material could not be verified.  Apparently this filter was placed about 25 to 30 years ago at outside facility.  Patient was hospitalized for pain management.  Started on steroids.  Back pain has improved.   Consultants: Phone discussion with neurosurgery by admitting team/EDP  Procedures: None    Subjective: Patient denies any complaints this morning.  Mentions that his glucose level was low this morning so he is feeling a little tired.  Back pain is much better and is not constantly present.    Assessment/Plan:   * Intractable back pain- (present on admission) Patient with longstanding history of chronic back pain.  Has been on opioid pain medications.  Follows with pain management clinic.  Was brought in because of significant worsening in the pain.  No history of any falls or injuries recently.  No neurological deficits noted on examination.   MRI was initially ordered but could not be done since he has metal IVC filter.  This filter was apparently placed in outside facility  more than 20 years ago.   I did try to call his cardiologist in [place] but have not heard back.  I also called [facility] in [place] where apparently the filter was placed and the radiology department does not have any records of the type of IVC that was placed.  So with this limited information we will not be able to do MRI in this patient.  Patient subsequently underwent CT of the lumbar spine which does suggest a significant spinal stenosis in the lumbar spine.  Findings were discussed with neurosurgery.   Pain likely from significant spinal stenosis and lumbar area.  No role for any operative intervention currently.   After discussions with his daughter and since likelihood of infection is low patient was started on steroids.  Patient's pain has significantly improved compared to initial presentation.  He is more comfortable sitting upright.  Not constantly complaining of pain.  Mobility appears to have improved as well.  Continue PT and OT.  Short-term rehab was recommended.  Changed over to oral steroids yesterday.  We will plan for a slow taper.  Can follow-up with neurosurgery in the next few weeks depending on pain and mobility.  Hyperglycemia due to type 2 diabetes mellitus ([place])- (present on admission) HbA1c 6.6.  Prior to admission he was on Lantus.  It was held initially due to low glucose levels presumably from poor oral intake. Subsequently noted to have hyperglycemia from steroids.  Started back on Lantus.  Also placed on SSI and meal coverage. Noted to be quite hypoglycemic this morning.  Likely because of change in dose of  steroid.  We will discontinue the meal coverage.  Stop the night SSI coverage.  Will decrease the dose of glargine.  Skip the dose today for an monitor CBGs.  May need dose of basal insulin later today.   Plan will be to decrease the dose of Lantus at discharge considering low HbA1c in elderly patient.  CKD (chronic kidney disease) stage 4, GFR 15-29  ml/min (HCC)- (present on admission) Acute Kidney Injury Hyperkalemia Hyponatremia  Baseline renal function noted to be between 1.8-2.1.   Creatinine did climb up to 2.67 with elevated BUN.  Elevated BUN likely from steroids. Patient has had poor oral intake for several days due to his pain issues.  He was likely dehydrated.  Patient was given IV fluids with improvement in renal function and is close to baseline.  Stop IV fluids today.   Potassium improved with Lokelma.  Normal today. Sodium level dropped as low as 126. Urine sodium was less than 10, urine osmolality 319.  Thought to be secondary to hypovolemia.  Was given normal saline infusion with improvement in sodium.  Up to 132 today.  Stop IV fluids.  No evidence for fluid overload currently.  Weight has been stable.  Hypothyroid- (present on admission) Continue levothyroxine.  Chronic systolic CHF (congestive heart failure) ([place])- (present on admission) Last echo in our system in 2021 showed EF less than 20%, G1 DD, RV function mildly reduced. Weight has been stable.   His furosemide was held due to worsening renal function and hyponatremia.  Essential hypertension- (present on admission) Patient daughter informed me that he is no longer on losartan at home.  This has been discontinued. Blood pressure stable for the most part.  Dementia ([place])- (present on admission) Stable.  Continue home medication  Peripheral vascular disease ([place])- (present on admission) Stable  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- (present on admission) Stable  Moderate recurrent major depression ([place])- (present on admission) Noted to be on Lexapro and trazodone.  Thrombocytopenia ([place])- (present on admission) Leukocytosis Normocytic anemia  Counts are low but stable.   Platelet counts have improved.  Lovenox was held a few days ago.  Remains on SCDs.      DVT Prophylaxis: SCDs Code Status: DNR Family  Communication: Daughter being updated at least every other day. Disposition Plan: SNF  Status is: Inpatient  Remains inpatient appropriate because: Intractable back pain, ambulatory dysfunction   Planned Discharge Destination: SNF       Medications: Scheduled:  acetaminophen  650 mg Oral TID   aspirin EC  81 mg Oral Daily   colestipol  1 g Oral QODAY   escitalopram  10 mg Oral Daily   insulin aspart  0-20 Units Subcutaneous TID WC   [START ON 04/25/2021] insulin glargine-yfgn  20 Units Subcutaneous Daily   levothyroxine  100 mcg Oral QAC breakfast   lidocaine  1 patch Transdermal Q24H   lipase/protease/amylase  36,000 Units Oral TID AC   polyethylene glycol  17 g Oral Daily   predniSONE  60 mg Oral Q breakfast   sodium chloride flush  3 mL Intravenous Q12H   traZODone  25 mg Oral QHS   Continuous:  MCN:[id number] **OR** acetaminophen, HYDROcodone-acetaminophen, HYDROmorphone (DILAUDID) injection, loperamide, polyethylene glycol, senna  Antibiotics: Anti-infectives (From admission, onward)    None       Objective:  Vital Signs  Vitals:   04/23/21 2339 04/24/21 0318 04/24/21 0500 04/24/21 0740  BP: 111/61 (!) 120/53  (!) 136/57  Pulse: Marland Kitchen)  57 61  61  Resp: 19 10  18   Temp: 98 F (36.7 C) 98.2 F (36.8 C)  98.2 F (36.8 C)  TempSrc: Oral Oral  Axillary  SpO2: 94% 95%  97%  Weight:   78.9 kg   Height:        Intake/Output Summary (Last 24 hours) at 04/24/2021 0836 Last data filed at 04/24/2021 8937 Gross per 24 hour  Intake 1126.99 ml  Output 400 ml  Net 726.99 ml    Filed Weights   04/21/21 0414 04/22/21 0500 04/24/21 0500  Weight: 73.8 kg 75.3 kg 78.9 kg    General appearance: Awake alert.  In no distress.  Hard of hearing Resp: Clear to auscultation bilaterally.  Normal effort Cardio: S1-S2 is normal regular.  No S3-S4.  No rubs murmurs or bruit GI: Abdomen is soft.  Nontender nondistended.  Bowel sounds are present normal.  No masses  organomegaly Extremities: No edema.  Able to move his lower extremities Neurologic: No focal neurological deficits.       Lab Results:  Data Reviewed: I have personally reviewed labs and imaging study reports  CBC: Recent Labs  Lab 04/18/21 1653 04/19/21 0435 04/20/21 0145 04/21/21 0337 04/22/21 0417 04/23/21 0213 04/24/21 0413  WBC 12.9*   < > 12.2* 12.2* 8.6 9.3 13.8*  NEUTROABS 9.9*  --   --   --   --   --   --   HGB 12.5*   < > 12.2* 11.5* 11.3* 11.7* 11.5*  HCT 37.8*   < > 36.0* 32.7* 32.2* 34.6* 32.6*  MCV 99.0   < > 97.6 95.6 93.9 95.3 93.9  PLT 104*   < > 91* 91* 76* 97* 112*   < > = values in this interval not displayed.     Basic Metabolic Panel: Recent Labs  Lab 04/20/21 0145 04/21/21 0337 04/22/21 0417 04/23/21 0213 04/24/21 0413  NA 133* 128* 126* 128* 132*  K 5.2* 5.2* 5.3* 4.4 4.1  CL 98 95* 97* 96* 102  CO2 23 21* 18* 21* 21*  GLUCOSE 235* 275* 180* 159* 43*  BUN 49* 89* 99* 97* 94*  CREATININE 2.05* 2.67* 2.21* 2.29* 2.16*  CALCIUM 8.5* 8.3* 8.0* 8.2* 7.8*     GFR: Estimated Creatinine Clearance: 18.4 mL/min (A) (by C-G formula based on SCr of 2.16 mg/dL (H)).  Liver Function Tests: Recent Labs  Lab 04/18/21 1653 04/19/21 0435  AST 22 19  ALT 17 15  ALKPHOS 145* 131*  BILITOT 0.8 0.7  PROT 6.8 6.3*  ALBUMIN 3.0* 2.7*     CBG: Recent Labs  Lab 04/24/21 0545 04/24/21 0604 04/24/21 0606 04/24/21 0618 04/24/21 0734  GLUCAP 34* 49* 52* 81 137*       Radiology Studies: No results found.     LOS: 4 days   Troy Hicks [employer] on [email or web address]  04/24/2021, 8:36 AM

## 2021-04-25 LAB — CBC
HCT: 33.7 % — ABNORMAL LOW (ref 39.0–52.0)
Hemoglobin: 11.5 g/dL — ABNORMAL LOW (ref 13.0–17.0)
MCH: 32.4 pg (ref 26.0–34.0)
MCHC: 34.1 g/dL (ref 30.0–36.0)
MCV: 94.9 fL (ref 80.0–100.0)
Platelets: 100 10*3/uL — ABNORMAL LOW (ref 150–400)
RBC: 3.55 MIL/uL — ABNORMAL LOW (ref 4.22–5.81)
RDW: 13.3 % (ref 11.5–15.5)
WBC: 14.6 10*3/uL — ABNORMAL HIGH (ref 4.0–10.5)
nRBC: 0 % (ref 0.0–0.2)

## 2021-04-25 LAB — BASIC METABOLIC PANEL
Anion gap: 6 (ref 5–15)
BUN: 95 mg/dL — ABNORMAL HIGH (ref 8–23)
CO2: 20 mmol/L — ABNORMAL LOW (ref 22–32)
Calcium: 7.8 mg/dL — ABNORMAL LOW (ref 8.9–10.3)
Chloride: 103 mmol/L (ref 98–111)
Creatinine, Ser: 1.8 mg/dL — ABNORMAL HIGH (ref 0.61–1.24)
GFR, Estimated: 33 mL/min — ABNORMAL LOW (ref >60)
Glucose, Bld: 169 mg/dL — ABNORMAL HIGH (ref 70–99)
Potassium: 5 mmol/L (ref 3.5–5.1)
Sodium: 129 mmol/L — ABNORMAL LOW (ref 135–145)

## 2021-04-25 LAB — GLUCOSE, CAPILLARY
Glucose-Capillary: 171 mg/dL — ABNORMAL HIGH (ref 70–99)
Glucose-Capillary: 236 mg/dL — ABNORMAL HIGH (ref 70–99)
Glucose-Capillary: 303 mg/dL — ABNORMAL HIGH (ref 70–99)
Glucose-Capillary: 337 mg/dL — ABNORMAL HIGH (ref 70–99)

## 2021-04-25 MED ORDER — FUROSEMIDE 40 MG PO TABS
80.0000 mg | ORAL_TABLET | Freq: Every day | ORAL | Status: DC
  Administered 2021-04-25 – 2021-04-27 (×3): 80 mg via ORAL
  Filled 2021-04-25 (×3): qty 2

## 2021-04-25 NOTE — TOC Progression Note (Signed)
Transition of Care [facility]) - Progression Note    Patient Details  Name: Reginaldo Hazard MRN: [id number] Date of Birth: 1919-05-03  Transition of Care [facility]) CM/SW [place], [place] Phone Number: 04/25/2021, 5:09 PM  Clinical Narrative:     CSW met with patient and daughter at bedside. CSW informed of bed offers. Patient's daughter requested to inquire with [facility] for short term rehab. CSW advised will send message but consider other SNFs as back up plan. She states understanding.  CSW sent message to [facility]- SNF will review and follow up with CSW. CSW will continue to follow and assist with discharge planning.  Thurmond Butts, MSW, LCSW Clinical Social Worker     Expected Discharge Plan: Skilled Nursing Facility Barriers to Discharge: [facility] broker, Continued Medical Work up, SNF Pending bed offer  Expected Discharge Plan and Services Expected Discharge Plan: [place] In-house Referral: Clinical Social Work     Living arrangements for the past 2 months: [place]                                       Social Determinants of Health (SDOH) Interventions    Readmission Risk Interventions No flowsheet data found.

## 2021-04-25 NOTE — TOC Progression Note (Signed)
Transition of Care [facility]) - Progression Note    Patient Details  Name: Hodges Treiber MRN: [id number] Date of Birth: 10-24-1919  Transition of Care [facility]) CM/SW [place], [place] Phone Number: 04/25/2021, 12:20 PM  Clinical Narrative:     Called patient's daughter to provide bed offers- no answer, will follow up later today  [facility]- no availability [place] - left voice message   TOC will continue to follow and assist with discharge planning.  Thurmond Butts, MSW, LCSW Clinical Social Worker    Expected Discharge Plan: Skilled Nursing Facility Barriers to Discharge: [facility] broker, Continued Medical Work up, SNF Pending bed offer  Expected Discharge Plan and Services Expected Discharge Plan: [place] In-house Referral: Clinical Social Work     Living arrangements for the past 2 months: [place]                                       Social Determinants of Health (SDOH) Interventions    Readmission Risk Interventions No flowsheet data found.

## 2021-04-25 NOTE — TOC Progression Note (Signed)
Transition of Care [facility]) - Progression Note    Patient Details  Name: Troy Hicks MRN: [id number] Date of Birth: December 26, 1919  Transition of Care [facility]) CM/SW [place], [place] Phone Number: 04/25/2021, 4:38 PM  Clinical Narrative:     Called patient's daughter, left voice message of SNF offers and requested call back with SNF choice.  Expected Discharge Plan: Skilled Nursing Facility Barriers to Discharge: Ship broker, Continued Medical Work up, SNF Pending bed offer  Expected Discharge Plan and Services Expected Discharge Plan: [place] In-house Referral: Clinical Social Work     Living arrangements for the past 2 months: [place]                                       Social Determinants of Health (SDOH) Interventions    Readmission Risk Interventions No flowsheet data found.

## 2021-04-25 NOTE — Progress Notes (Signed)
Physical Therapy Treatment Patient Details Name: Troy Hicks MRN: [id number] DOB: 1920-02-21 Today's Date: 04/25/2021   History of Present Illness 88 y.o. male with medical history significant of diabetes, high thrombocytopenia, hyperlipidemia, peripheral vascular disease, atrial fibrillation, depression, hypothyroidism, CHF, gout, glaucoma, hypertension, dysphagia, dementia, CKD 3 who presented with ongoing back pain.    PT Comments    Patient progressing this session to in room ambulation.  Still with some pain, but reports mostly resolved from his initial issue.  States feeling stiff and generally weak.  Patient able to toilet and complete hygiene on his own after ambulation, but visibly fatigued.  Feel he remains appropriate for SNF level rehab.  PT will continue to follow acutely.    Recommendations for follow up therapy are one component of a multi-disciplinary discharge planning process, led by the attending physician.  Recommendations may be updated based on patient status, additional functional criteria and insurance authorization.  Follow Up Recommendations  Skilled nursing-short term rehab (<3 hours/day)     Assistance Recommended at Discharge Frequent or constant Supervision/Assistance  Patient can return home with the following A lot of help with bathing/dressing/bathroom;A little help with walking and/or transfers;Assistance with cooking/housework;Assist for transportation;Help with stairs or ramp for entrance   Equipment Recommendations  None recommended by PT    Recommendations for Other Services       Precautions / Restrictions Precautions Precautions: Fall     Mobility  Bed Mobility Overal bed mobility: Needs Assistance Bed Mobility: Rolling, Sidelying to Sit, Sit to Sidelying Rolling: Min assist Sidelying to sit: Mod assist     Sit to sidelying: Mod assist General bed mobility comments: cues for technique, assist for legs and trunk to upright, to sidelying  assist for legs and positioning for trunk    Transfers Overall transfer level: Needs assistance Equipment used: Rolling walker (2 wheels) Transfers: Sit to/from Stand Sit to Stand: Mod assist           General transfer comment: some lifting help from EOB then from toilet in bathroom    Ambulation/Gait Ambulation/Gait assistance: Min assist Gait Distance (Feet): 40 Feet Assistive device: Rolling walker (2 wheels) Gait Pattern/deviations: Step-through pattern, Decreased stride length, Trunk flexed, Wide base of support       General Gait Details: slow pace and increased flexed with fatigue with ambulation walked to window then door then to bathroom   Stairs             Wheelchair Mobility    Modified Rankin (Stroke Patients Only)       Balance Overall balance assessment: Needs assistance Sitting-balance support: Feet supported Sitting balance-Leahy Scale: Fair     Standing balance support: Bilateral upper extremity supported, Reliant on assistive device for balance Standing balance-Leahy Scale: Poor                              Cognition Arousal/Alertness: Awake/alert Behavior During Therapy: WFL for tasks assessed/performed Overall Cognitive Status: History of cognitive impairments - at baseline                                 General Comments: decreased STM; very [facility]        Exercises General Exercises - Lower Extremity Ankle Circles/Pumps: AROM, 10 reps, Both, Supine Heel Slides: AROM, 5 reps, Both, Supine    General Comments General comments (skin integrity, edema, etc.): toileted  in bathroom, able to complete toilet hygiene with time and effort and endorsed fatigue      Pertinent Vitals/Pain Pain Assessment Faces Pain Scale: Hurts little more Pain Location: Low back Pain Descriptors / Indicators: Tightness Pain Intervention(s): Monitored during session, Repositioned    Home Living                           Prior Function            PT Goals (current goals can now be found in the care plan section) Progress towards PT goals: Progressing toward goals    Frequency    Min 2X/week      PT Plan Current plan remains appropriate    Co-evaluation              AM-PAC PT "6 Clicks" Mobility   Outcome Measure  Help needed turning from your back to your side while in a flat bed without using bedrails?: A Lot Help needed moving from lying on your back to sitting on the side of a flat bed without using bedrails?: A Lot Help needed moving to and from a bed to a chair (including a wheelchair)?: A Lot Help needed standing up from a chair using your arms (e.g., wheelchair or bedside chair)?: A Lot Help needed to walk in hospital room?: A Lot Help needed climbing 3-5 steps with a railing? : Total 6 Click Score: 11    End of Session Equipment Utilized During Treatment: Gait belt Activity Tolerance: Patient tolerated treatment well;Patient limited by fatigue Patient left: in bed;with call bell/phone within reach;with bed alarm set   PT Visit Diagnosis: Other abnormalities of gait and mobility (R26.89);Muscle weakness (generalized) (M62.81)     Time: 1610-9604 PT Time Calculation (min) (ACUTE ONLY): 30 min  Charges:  $Gait Training: 8-22 mins $Therapeutic Activity: 8-22 mins                     Troy Hicks, PT Acute Rehabilitation Services Pager:[phone] [phone] 04/25/2021    [name] 04/25/2021, 4:58 PM

## 2021-04-25 NOTE — Progress Notes (Signed)
[facility] PROGRESS NOTE   Troy Hicks UVO:[id number] DOB: 12-18-1919 DOA: 04/18/2021  5 DOS: the patient was seen and examined on 04/25/2021  PCP: [name], FNP  Brief History and Hospital Course:  88 y.o. male with medical history significant of diabetes, high thrombocytopenia, hyperlipidemia, peripheral vascular disease, atrial fibrillation, depression, hypothyroidism, CHF, gout, glaucoma, hypertension, dysphagia, dementia, CKD 3 who presented with ongoing back pain.  Patient has had chronic back pain for a very long time and over the last 6 months has had difficulty walking.  Follows with pain management.  Pain was thought to be particularly worse this past week.  Daughter discussed with patient's primary care provider who recommended bringing the patient to the emergency department.  MRI was considered however patient has IVC filter in place and as a result MRI could not be done since the filter material could not be verified.  Apparently this filter was placed about 25 to 30 years ago at outside facility.  Patient was hospitalized for pain management.  Started on steroids.  Back pain has improved.  Subsequently developed hyponatremia which was initially thought to be due to hypovolemia for which she was given IV fluids.  Sodium started improving.  Now appears to be slightly volume overloaded.  Will be given Lasix today.  Consultants: Phone discussion with neurosurgery by admitting team/EDP  Procedures: None    Subjective: Patient very hard of hearing.  Mentions that his back pain has improved.  Denies any new complaints at this time.     Assessment/Plan:   * Intractable back pain- (present on admission) Patient with longstanding history of chronic back pain.  Has been on opioid pain medications.  Follows with pain management clinic.  Was brought in because of significant worsening in the pain.  No history of any falls or injuries recently.  No neurological deficits noted on  examination.   MRI was initially ordered but could not be done since he has metal IVC filter.  This filter was apparently placed in outside facility more than 20 years ago.   I did try to call his cardiologist in [place] but have not heard back.  I also called [facility] in [place] where apparently the filter was placed and the radiology department does not have any records of the type of IVC that was placed.  So with this limited information we will not be able to do MRI in this patient.  Patient subsequently underwent CT of the lumbar spine which does suggest a significant spinal stenosis in the lumbar spine.  Findings were discussed with neurosurgery.   Pain likely from significant spinal stenosis and lumbar area.  No role for any operative intervention currently.   After discussions with his daughter and since likelihood of infection is low patient was started on steroids.  Patient's pain has significantly improved compared to initial presentation.  He is more comfortable sitting upright.  Not constantly complaining of pain.  Mobility appears to have improved as well.  Continue PT and OT.  Short-term rehab was recommended.  Changed over to oral steroids.  We will plan for a slow taper.  Can follow-up with neurosurgery in the next few weeks depending on pain and mobility.  Hyperglycemia due to type 2 diabetes mellitus ([place])- (present on admission) HbA1c 6.6.  Prior to admission he was on Lantus.  It was held initially due to low glucose levels presumably from poor oral intake. Subsequently noted to have hyperglycemia from steroids.  Started back on  Lantus.  Also placed on SSI and meal coverage. Noted to have hypoglycemia yesterday morning.  Insulin dose was adjusted.  Continue current dose for now.  CBG stable.  CKD (chronic kidney disease) stage 4, GFR 15-29 ml/min (HCC)- (present on admission) Acute Kidney Injury Hyperkalemia Hyponatremia  Baseline renal function noted to be  between 1.8-2.1.   Creatinine did climb up to 2.67 with elevated BUN.  Elevated BUN likely from steroids. Patient has had poor oral intake for several days due to his pain issues.  He was likely dehydrated.  Patient was given IV fluids with improvement in renal function and is close to baseline.  Potassium improved with Lokelma.   Sodium level dropped as low as 126. Urine sodium was less than 10, urine osmolality 319.  Thought to be secondary to hypovolemia.  Was given normal saline infusion with improvement in sodium.  Sodium came up to 132 yesterday.  Noted to be 129 today.  Some degree of volume overload is noted.  Will be given furosemide.  Recheck labs tomorrow.  Hypothyroid- (present on admission) Continue levothyroxine.  Chronic systolic CHF (congestive heart failure) ([place])- (present on admission) Last echo in our system in 2021 showed EF less than 20%, G1 DD, RV function mildly reduced. Was somewhat hypokalemic during her reported admission.  Today is noted to have JVD and mild pedal edema.  Will be given a dose of Lasix today and we will see his response over the next 24 hours.  Patient was on Lasix 80 mg twice a day prior to admission which was held due to acute renal failure. Not a candidate for ACE inhibitor or ARB due to chronic kidney disease. Not on beta-blocker either. His furosemide was held due to worsening renal function and hyponatremia.  Essential hypertension- (present on admission) Patient daughter informed me that he is no longer on losartan at home.  This has been discontinued. Blood pressure stable for the most part.  Dementia ([place])- (present on admission) Stable.  Continue home medication  Peripheral vascular disease ([place])- (present on admission) Stable  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- (present on admission) Stable  Moderate recurrent major depression ([place])- (present on admission) Noted to be on Lexapro and  trazodone.  Thrombocytopenia ([place])- (present on admission) Leukocytosis Normocytic anemia  Stable for the most part. Platelet counts have improved.  Lovenox was held a few days ago.  Remains on SCDs.      DVT Prophylaxis: SCDs Code Status: DNR Family Communication: Daughter being updated at least every other day. Disposition Plan: SNF hopefully in the next 24 to 48 hours.  Status is: Inpatient  Remains inpatient appropriate because: Intractable back pain, ambulatory dysfunction   Planned Discharge Destination: SNF       Medications: Scheduled:  acetaminophen  650 mg Oral TID   aspirin EC  81 mg Oral Daily   colestipol  1 g Oral QODAY   escitalopram  10 mg Oral Daily   furosemide  80 mg Oral Daily   insulin aspart  0-20 Units Subcutaneous TID WC   insulin glargine-yfgn  20 Units Subcutaneous Daily   levothyroxine  100 mcg Oral QAC breakfast   lidocaine  1 patch Transdermal Q24H   lipase/protease/amylase  36,000 Units Oral TID AC   polyethylene glycol  17 g Oral Daily   predniSONE  60 mg Oral Q breakfast   sodium chloride flush  3 mL Intravenous Q12H   traZODone  25 mg Oral QHS   Continuous:  FIE:[id number] **OR** acetaminophen, HYDROcodone-acetaminophen, HYDROmorphone (DILAUDID) injection, loperamide, polyethylene glycol, senna  Antibiotics: Anti-infectives (From admission, onward)    None       Objective:  Vital Signs  Vitals:   04/24/21 1924 04/24/21 2320 04/25/21 0322 04/25/21 0330  BP: 136/64 126/71 127/86   Pulse: 61 64 70   Resp: (!) 22 (!) 21 20   Temp: 97.6 F (36.4 C) 98.2 F (36.8 C) 97.6 F (36.4 C)   TempSrc: Oral Axillary Axillary   SpO2: 94% 96% 98%   Weight:    78.7 kg  Height:        Intake/Output Summary (Last 24 hours) at 04/25/2021 1120 Last data filed at 04/25/2021 0329 Gross per 24 hour  Intake --  Output 850 ml  Net -850 ml    Filed Weights   04/22/21 0500 04/24/21 0500 04/25/21 0330  Weight: 75.3 kg 78.9 kg 78.7  kg    General appearance: Awake alert.  In no distress.  Hard of hearing Resp: Clear to auscultation bilaterally.  Normal effort Cardio: S1-S2 is normal regular.  No S3-S4.  No rubs murmurs or bruit GI: Abdomen is soft.  Nontender nondistended.  Bowel sounds are present normal.  No masses organomegaly Extremities: No edema.  Able to move both of his lower extremities Neurologic:   No focal neurological deficits.       Lab Results:  Data Reviewed: I have personally reviewed labs and imaging study reports  CBC: Recent Labs  Lab 04/18/21 1653 04/19/21 0435 04/21/21 0337 04/22/21 0417 04/23/21 0213 04/24/21 0413 04/25/21 0142  WBC 12.9*   < > 12.2* 8.6 9.3 13.8* 14.6*  NEUTROABS 9.9*  --   --   --   --   --   --   HGB 12.5*   < > 11.5* 11.3* 11.7* 11.5* 11.5*  HCT 37.8*   < > 32.7* 32.2* 34.6* 32.6* 33.7*  MCV 99.0   < > 95.6 93.9 95.3 93.9 94.9  PLT 104*   < > 91* 76* 97* 112* 100*   < > = values in this interval not displayed.     Basic Metabolic Panel: Recent Labs  Lab 04/21/21 0337 04/22/21 0417 04/23/21 0213 04/24/21 0413 04/25/21 0142  NA 128* 126* 128* 132* 129*  K 5.2* 5.3* 4.4 4.1 5.0  CL 95* 97* 96* 102 103  CO2 21* 18* 21* 21* 20*  GLUCOSE 275* 180* 159* 43* 169*  BUN 89* 99* 97* 94* 95*  CREATININE 2.67* 2.21* 2.29* 2.16* 1.80*  CALCIUM 8.3* 8.0* 8.2* 7.8* 7.8*     GFR: Estimated Creatinine Clearance: 22.1 mL/min (A) (by C-G formula based on SCr of 1.8 mg/dL (H)).  Liver Function Tests: Recent Labs  Lab 04/18/21 1653 04/19/21 0435  AST 22 19  ALT 17 15  ALKPHOS 145* 131*  BILITOT 0.8 0.7  PROT 6.8 6.3*  ALBUMIN 3.0* 2.7*     CBG: Recent Labs  Lab 04/24/21 0734 04/24/21 1201 04/24/21 1619 04/24/21 2114 04/25/21 0743  GLUCAP 137* 267* 321* 240* 171*       Radiology Studies: No results found.     LOS: 5 days   Saylee Sherrill [employer] on [email or web address]  04/25/2021, 11:20 AM

## 2021-04-26 LAB — CBC
HCT: 33.4 % — ABNORMAL LOW (ref 39.0–52.0)
Hemoglobin: 11.4 g/dL — ABNORMAL LOW (ref 13.0–17.0)
MCH: 32.4 pg (ref 26.0–34.0)
MCHC: 34.1 g/dL (ref 30.0–36.0)
MCV: 94.9 fL (ref 80.0–100.0)
Platelets: 99 10*3/uL — ABNORMAL LOW (ref 150–400)
RBC: 3.52 MIL/uL — ABNORMAL LOW (ref 4.22–5.81)
RDW: 13.5 % (ref 11.5–15.5)
WBC: 13.6 10*3/uL — ABNORMAL HIGH (ref 4.0–10.5)
nRBC: 0 % (ref 0.0–0.2)

## 2021-04-26 LAB — GLUCOSE, CAPILLARY
Glucose-Capillary: 175 mg/dL — ABNORMAL HIGH (ref 70–99)
Glucose-Capillary: 232 mg/dL — ABNORMAL HIGH (ref 70–99)
Glucose-Capillary: 311 mg/dL — ABNORMAL HIGH (ref 70–99)
Glucose-Capillary: 336 mg/dL — ABNORMAL HIGH (ref 70–99)

## 2021-04-26 LAB — BASIC METABOLIC PANEL
Anion gap: 11 (ref 5–15)
BUN: 90 mg/dL — ABNORMAL HIGH (ref 8–23)
CO2: 18 mmol/L — ABNORMAL LOW (ref 22–32)
Calcium: 7.9 mg/dL — ABNORMAL LOW (ref 8.9–10.3)
Chloride: 102 mmol/L (ref 98–111)
Creatinine, Ser: 1.88 mg/dL — ABNORMAL HIGH (ref 0.61–1.24)
GFR, Estimated: 32 mL/min — ABNORMAL LOW (ref >60)
Glucose, Bld: 202 mg/dL — ABNORMAL HIGH (ref 70–99)
Potassium: 5.1 mmol/L (ref 3.5–5.1)
Sodium: 131 mmol/L — ABNORMAL LOW (ref 135–145)

## 2021-04-26 MED ORDER — MELATONIN 3 MG PO TABS
3.0000 mg | ORAL_TABLET | Freq: Every day | ORAL | Status: DC
  Administered 2021-04-26: 3 mg via ORAL
  Filled 2021-04-26: qty 1

## 2021-04-26 MED ORDER — INSULIN ASPART 100 UNIT/ML IJ SOLN
0.0000 [IU] | Freq: Every day | INTRAMUSCULAR | Status: DC
  Administered 2021-04-26: 4 [IU] via SUBCUTANEOUS

## 2021-04-26 MED ORDER — INSULIN ASPART 100 UNIT/ML IJ SOLN
0.0000 [IU] | Freq: Three times a day (TID) | INTRAMUSCULAR | Status: DC
  Administered 2021-04-26: 7 [IU] via SUBCUTANEOUS
  Administered 2021-04-26: 15 [IU] via SUBCUTANEOUS
  Administered 2021-04-27 (×2): 11 [IU] via SUBCUTANEOUS

## 2021-04-26 NOTE — Progress Notes (Signed)
Occupational Therapy Treatment Patient Details Name: Troy Hicks MRN: [id number] DOB: 1919/06/03 Today's Date: 04/26/2021   History of present illness 88 y.o. male with medical history significant of diabetes, high thrombocytopenia, hyperlipidemia, peripheral vascular disease, atrial fibrillation, depression, hypothyroidism, CHF, gout, glaucoma, hypertension, dysphagia, dementia, CKD 3 who presented with ongoing back pain.   OT comments  Patient continues with ongoing back pain issues, and generalized weakness.  Continues to make attempts at mobility, needing constant cues for log rolling due to memory deficits.  Patient left back in bed, continue to recommend SNF for post acute rehab to maximize functional status prior to returning to ALF.  OT can continue efforts in the acute setting.     Recommendations for follow up therapy are one component of a multi-disciplinary discharge planning process, led by the attending physician.  Recommendations may be updated based on patient status, additional functional criteria and insurance authorization.    Follow Up Recommendations  Skilled nursing-short term rehab (<3 hours/day)    Assistance Recommended at Discharge Frequent or constant Supervision/Assistance  Patient can return home with the following  A lot of help with walking and/or transfers;A lot of help with bathing/dressing/bathroom;Assist for transportation;Assistance with cooking/housework   Equipment Recommendations       Recommendations for Other Services      Precautions / Restrictions Precautions Precautions: Fall Restrictions Weight Bearing Restrictions: No       Mobility Bed Mobility Overal bed mobility: Needs Assistance Bed Mobility: Sidelying to Sit, Sit to Sidelying   Sidelying to sit: Mod assist     Sit to sidelying: Mod assist      Transfers                         Balance Overall balance assessment: Needs assistance Sitting-balance support: Feet  supported Sitting balance-Leahy Scale: Fair                                     ADL either performed or assessed with clinical judgement   ADL       Grooming: Supervision/safety;Sitting                                        Cognition Arousal/Alertness: Awake/alert Behavior During Therapy: WFL for tasks assessed/performed Overall Cognitive Status: History of cognitive impairments - at baseline                                 General Comments: decreased STM and very HOH                           Pertinent Vitals/ Pain       Pain Assessment Pain Assessment: Faces Faces Pain Scale: Hurts even more Pain Location: Low back Pain Descriptors / Indicators: Tightness, Shooting Pain Intervention(s): Monitored during session                                                          Frequency  Min 2X/week  Progress Toward Goals  OT Goals(current goals can now be found in the care plan section)  Progress towards OT goals: Progressing toward goals  Acute Rehab OT Goals OT Goal Formulation: With patient Time For Goal Achievement: 05/04/21 Potential to Achieve Goals: Good  Plan Discharge plan remains appropriate;Frequency remains appropriate    Co-evaluation                 AM-PAC OT "6 Clicks" Daily Activity     Outcome Measure   Help from another person eating meals?: None Help from another person taking care of personal grooming?: A Little Help from another person toileting, which includes using toliet, bedpan, or urinal?: A Lot Help from another person bathing (including washing, rinsing, drying)?: A Lot Help from another person to put on and taking off regular upper body clothing?: A Little Help from another person to put on and taking off regular lower body clothing?: A Lot 6 Click Score: 16    End of Session    OT Visit Diagnosis: Pain Pain - Right/Left:  (back)    Activity Tolerance Patient limited by pain   Patient Left with call bell/phone within reach;in bed   Nurse Communication Mobility status        Time: 6415-8309 OT Time Calculation (min): 16 min  Charges: OT General Charges $OT Visit: 1 Visit OT Treatments $Self Care/Home Management : 8-22 mins  04/26/2021  RP, OTR/L  Acute Rehabilitation Services  Office:  [phone]   Metta Clines 04/26/2021, 10:20 AM

## 2021-04-26 NOTE — Assessment & Plan Note (Signed)
Melatonin as needed.

## 2021-04-26 NOTE — Assessment & Plan Note (Signed)
Continue colestid as at home.

## 2021-04-26 NOTE — Assessment & Plan Note (Signed)
Noted. Continue PT/OT and pain control.

## 2021-04-26 NOTE — TOC Transition Note (Addendum)
Transition of Care [facility]) - CM/SW Discharge Note   Patient Details  Name: Troy Hicks MRN: [id number] Date of Birth: 23-Feb-1920  [facility] CM/SW Contact:  Vinie Sill, LCSW Phone Number: 04/26/2021, 3:54 PM   Clinical Narrative:     Patient will Discharge to: [facility] Discharge Date: 04/27/2021 Family Notified: daughter Transport OK:[id number] will transport to facility  Per MD patient is ready for discharge. RN, patient, and facility notified of discharge. Discharge Summary sent to facility. RN given number for [phone], Room 101-B.  Clinical Social Worker signing off.  Thurmond Butts, MSW, LCSW Clinical Social Worker       Final next level of care: Skilled Nursing Facility Barriers to Discharge: Barriers Resolved   Patient Goals and CMS Choice        Discharge Placement                Patient to be transferred to facility by: [facility]   Patient and family notified of of transfer: 04/26/21  Discharge Plan and Services In-house Referral: Clinical Social Work                                   Social Determinants of Health (SDOH) Interventions     Readmission Risk Interventions No flowsheet data found.

## 2021-04-26 NOTE — TOC Progression Note (Signed)
Transition of Care [facility]) - Progression Note    Patient Details  Name: Troy Hicks MRN: [id number] Date of Birth: Jul 19, 1919  [facility]) CM/SW Contact  Vinie Sill, [place] Phone Number: 04/26/2021, 3:45 PM  Clinical Narrative:     Patinet's daughter has accepted bed offer with [facility] will start insurance authorization   If approved, patient can d/c tomorrow to [facility].   TOC will continue to follow and assist with discharge planning.   Thurmond Butts, MSW, LCSW Clinical Social Worker    Expected Discharge Plan: Skilled Nursing Facility Barriers to Discharge: Ship broker, Continued Medical Work up, SNF Pending bed offer  Expected Discharge Plan and Services Expected Discharge Plan: [place] In-house Referral: Clinical Social Work     Living arrangements for the past 2 months: [place]                                       Social Determinants of Health (SDOH) Interventions    Readmission Risk Interventions No flowsheet data found.

## 2021-04-26 NOTE — Progress Notes (Signed)
PROGRESS NOTE  Troy Hicks HDQ:[id number] DOB: 12/04/1919 DOA: 04/18/2021 PCP: [name], FNP  Brief History   88 y.o. male with medical history significant of diabetes, high thrombocytopenia, hyperlipidemia, peripheral vascular disease, atrial fibrillation, depression, hypothyroidism, CHF, gout, glaucoma, hypertension, dysphagia, dementia, CKD 3 who presented with ongoing back pain.  Patient has had chronic back pain for a very long time and over the last 6 months has had difficulty walking.  Follows with pain management.  Pain was thought to be particularly worse this past week.  Daughter discussed with patient's primary care provider who recommended bringing the patient to the emergency department.  MRI was considered however patient has IVC filter in place and as a result MRI could not be done since the filter material could not be verified.  Apparently this filter was placed about 25 to 30 years ago at outside facility.  Patient was hospitalized for pain management.  Started on steroids.  Back pain has improved.  Subsequently developed hyponatremia which was initially thought to be due to hypovolemia for which she was given IV fluids.  Sodium started improving.  Now appears to be slightly volume overloaded.  Will be given Lasix today.  Consultants  Phone discussion with neurosurgery by admitting team/EDP  Procedures  None  Antibiotics   Anti-infectives (From admission, onward)    None        Subjective  The patient is resting comfortably. No new complaints.  Objective   Vitals:  Vitals:   04/26/21 1213 04/26/21 1501  BP: 123/70 133/63  Pulse: 69 65  Resp: (!) 25 (!) 24  Temp: 98.2 F (36.8 C) 98.8 F (37.1 C)  SpO2: 95% 94%    Exam:  Constitutional:  The patient is awake, alert, and oriented x 3. No acute distress. Respiratory:  No increased work of breathing. No wheezes, rales, or rhonchi No tactile fremitus Cardiovascular:  Regular rate and rhythm No  murmurs, ectopy, or gallups. No lateral PMI. No thrills. Abdomen:  Abdomen is soft, non-tender, non-distended No hernias, masses, or organomegaly Normoactive bowel sounds.  Musculoskeletal:  No cyanosis, clubbing, or edema Skin:  No rashes, lesions, ulcers palpation of skin: no induration or nodules Neurologic:  CN 2-12 intact Sensation all 4 extremities intact Psychiatric:  Mental status Mood, affect appropriate Orientation to person, place, time  judgment and insight appear intact  I have personally reviewed the following:   Today's Data  vitals  Lab Data  BNP CBC  Micro Data    Imaging  CT abdomen and pelvis without contrast  Cardiology Data    Other Data    Scheduled Meds:  acetaminophen  650 mg Oral TID   aspirin EC  81 mg Oral Daily   colestipol  1 g Oral QODAY   escitalopram  10 mg Oral Daily   furosemide  80 mg Oral Daily   insulin aspart  0-20 Units Subcutaneous TID WC   insulin aspart  0-5 Units Subcutaneous QHS   insulin glargine-yfgn  20 Units Subcutaneous Daily   levothyroxine  100 mcg Oral QAC breakfast   lidocaine  1 patch Transdermal Q24H   lipase/protease/amylase  36,000 Units Oral TID AC   polyethylene glycol  17 g Oral Daily   predniSONE  60 mg Oral Q breakfast   sodium chloride flush  3 mL Intravenous Q12H   traZODone  25 mg Oral QHS   Continuous Infusions:  Principal Problem:   Intractable back pain Active Problems:   Thrombocytopenia ([place])  Adjustment disorder with depressed mood   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   Chronic pain   Dementia (HCC)   Essential hypertension   Peripheral vascular disease (HCC)   Hyperglycemia due to type 2 diabetes mellitus ([place])   Hyperlipidemia   Hypothyroid   Lumbosacral spondylosis without myelopathy   Moderate recurrent major depression (HCC)   Insomnia   Chronic systolic CHF (congestive heart failure) (HCC)   CKD (chronic kidney disease)  stage 4, GFR 15-29 ml/min (HCC)   LOS: 6 days   A & P  Intractable back pain Patient with longstanding history of chronic back pain.  Has been on opioid pain medications.  Follows with pain management clinic.  Was brought in because of significant worsening in the pain.  No history of any falls or injuries recently.  No neurological deficits noted on examination.   MRI was initially ordered but could not be done since he has metal IVC filter.  This filter was apparently placed in outside facility more than 20 years ago.   I did try to call his cardiologist in [place] but have not heard back.  I also called [facility] in [place] where apparently the filter was placed and the radiology department does not have any records of the type of IVC that was placed.  So with this limited information we will not be able to do MRI in this patient.  Patient subsequently underwent CT of the lumbar spine which does suggest a significant spinal stenosis in the lumbar spine.  Findings were discussed with neurosurgery.   Pain likely from significant spinal stenosis and lumbar area.  No role for any operative intervention currently.   After discussions with his daughter and since likelihood of infection is low patient was started on steroids.  Patient's pain has significantly improved compared to initial presentation.  He is more comfortable sitting upright.  Not constantly complaining of pain.  Mobility appears to have improved as well.  Continue PT and OT.  Short-term rehab was recommended.  Changed over to oral steroids.  We will plan for a slow taper.  Can follow-up with neurosurgery in the next few weeks depending on pain and mobility.  Hyperglycemia due to type 2 diabetes mellitus (HCC) HbA1c 6.6.  Prior to admission he was on Lantus.  It was held initially due to low glucose levels presumably from poor oral intake. Subsequently noted to have hyperglycemia from steroids.  Started back on Lantus.   Also placed on SSI and meal coverage. Noted to have hypoglycemia yesterday morning.  Insulin dose was adjusted.  Continue current dose for now.  CBG stable.  Hypothyroid Continue levothyroxine 100 mcg daily as at home.  Essential hypertension Patient daughter informed me that he is no longer on losartan at home.  This has been discontinued. Blood pressure stable for the most part.  Dementia ([place]) Stable.  Continue home medication  Moderate recurrent major depression ([place]) Noted to be on Lexapro and trazodone.  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 Stable  Peripheral vascular disease (HCC) Stable  Thrombocytopenia (HCC) Leukocytosis Normocytic anemia  Stable for the most part. Platelet counts have improved.  Lovenox was held a few days ago.  Remains on SCDs.    Chronic systolic CHF (congestive heart failure) ([place]) Last echo in our system in 2021 showed EF less than 20%, G1 DD, RV function mildly reduced. Was somewhat hypokalemic during her reported admission.  Today is noted to have  JVD and mild pedal edema.  Will be given a dose of Lasix today and we will see his response over the next 24 hours.  Patient was on Lasix 80 mg twice a day prior to admission which was held due to acute renal failure. Not a candidate for ACE inhibitor or ARB due to chronic kidney disease. Not on beta-blocker either. His furosemide was held due to worsening renal function and hyponatremia. It has now been resumed at 80 mg by mouth daily. Monitor creatinine, electrolytes and volume status.  CKD (chronic kidney disease) stage 4, GFR 15-29 ml/min (HCC) Acute Kidney Injury Hyperkalemia Hyponatremia  Baseline renal function noted to be between 1.8-2.1. Creatinine 1.88 today. Creatinine did climb up to 2.67 with elevated BUN.  Elevated BUN likely from steroids. Patient has had poor oral intake for several days due to his pain issues.  He was likely dehydrated.   Patient was given IV fluids with improvement in renal function and is close to baseline. Potassium improved with Lokelma.   Sodium level dropped as low as 126. Urine sodium was less than 10, urine osmolality 319.  Thought to be secondary to hypovolemia.  Was given normal saline infusion with improvement in sodium.  Sodium came up to 131 today.    Some degree of volume overload is noted. Continue to monitor closely now that lasix has been resumed, although at 1/2 the frequency that he takes it at home.  Hyperlipidemia Continue colestid as at home.  Insomnia Melatonin as needed.  Lumbosacral spondylosis without myelopathy Noted. Continue PT/OT and pain control.  I have seen and examined this patient myself. I have spent 34 minutes in his evaluation and care.  DVT prophylaxis: SCD's Code Status: DNR Family Communication: None available Disposition Plan: tbd    Deklyn Gibbon, DO [facility] Direct contact: see [email or web address]  7PM-7AM contact night coverage as above 04/26/2021, 5:31 PM  LOS: 6 days

## 2021-04-27 LAB — CBC
HCT: 32.8 % — ABNORMAL LOW (ref 39.0–52.0)
Hemoglobin: 11.3 g/dL — ABNORMAL LOW (ref 13.0–17.0)
MCH: 32.8 pg (ref 26.0–34.0)
MCHC: 34.5 g/dL (ref 30.0–36.0)
MCV: 95.3 fL (ref 80.0–100.0)
Platelets: 100 10*3/uL — ABNORMAL LOW (ref 150–400)
RBC: 3.44 MIL/uL — ABNORMAL LOW (ref 4.22–5.81)
RDW: 13.5 % (ref 11.5–15.5)
WBC: 11.5 10*3/uL — ABNORMAL HIGH (ref 4.0–10.5)
nRBC: 0 % (ref 0.0–0.2)

## 2021-04-27 LAB — BASIC METABOLIC PANEL
Anion gap: 12 (ref 5–15)
BUN: 86 mg/dL — ABNORMAL HIGH (ref 8–23)
CO2: 17 mmol/L — ABNORMAL LOW (ref 22–32)
Calcium: 7.8 mg/dL — ABNORMAL LOW (ref 8.9–10.3)
Chloride: 101 mmol/L (ref 98–111)
Creatinine, Ser: 1.84 mg/dL — ABNORMAL HIGH (ref 0.61–1.24)
GFR, Estimated: 33 mL/min — ABNORMAL LOW (ref >60)
Glucose, Bld: 344 mg/dL — ABNORMAL HIGH (ref 70–99)
Potassium: 5.2 mmol/L — ABNORMAL HIGH (ref 3.5–5.1)
Sodium: 130 mmol/L — ABNORMAL LOW (ref 135–145)

## 2021-04-27 LAB — GLUCOSE, CAPILLARY
Glucose-Capillary: 259 mg/dL — ABNORMAL HIGH (ref 70–99)
Glucose-Capillary: 299 mg/dL — ABNORMAL HIGH (ref 70–99)
Glucose-Capillary: 198 mg/dL — ABNORMAL HIGH (ref 70–99)

## 2021-04-27 MED ORDER — MELATONIN 3 MG PO TABS: 3.0000 mg | ORAL_TABLET | Freq: Every day | ORAL | 0 refills | Status: DC

## 2021-04-27 MED ORDER — HYDROCODONE-ACETAMINOPHEN 5-325 MG PO TABS: 1.0000 | ORAL_TABLET | Freq: Three times a day (TID) | ORAL | 0 refills | Status: DC | PRN

## 2021-04-27 MED ORDER — INSULIN GLARGINE 100 UNIT/ML ~~LOC~~ SOLN: 20.0000 [IU] | Freq: Every day | SUBCUTANEOUS | 0 refills | Status: DC

## 2021-04-27 MED ORDER — FUROSEMIDE 80 MG PO TABS: 80.0000 mg | ORAL_TABLET | Freq: Every day | ORAL | 0 refills | Status: AC

## 2021-04-27 MED ORDER — LIDOCAINE 5 % EX PTCH: 1.0000 | MEDICATED_PATCH | CUTANEOUS | 0 refills | Status: DC

## 2021-04-27 MED ORDER — PREDNISONE 20 MG PO TABS: ORAL_TABLET | ORAL | 0 refills | Status: DC

## 2021-04-27 MED ORDER — PREDNISONE 20 MG PO TABS: ORAL_TABLET | ORAL | 0 refills | Status: AC

## 2021-04-27 NOTE — Care Management Important Message (Signed)
Important Message  Patient Details  Name: Troy Hicks MRN: [id number] Date of Birth: February 01, 1920   Medicare Important Message Given:  Yes     [name] 04/27/2021, 2:06 PM

## 2021-04-27 NOTE — Progress Notes (Signed)
Inpatient Diabetes Program Recommendations  AACE/ADA: New Consensus Statement on Inpatient Glycemic Control (2015)  Target Ranges:  Prepandial:   less than 140 mg/dL      Peak postprandial:   less than 180 mg/dL (1-2 hours)      Critically ill patients:  140 - 180 mg/dL   Lab Results  Component Value Date   GLUCAP 299 (H) 04/27/2021   HGBA1C 6.6 (H) 04/19/2021    Review of Glycemic Control  Latest Reference Range & Units 04/26/21 15:00 04/26/21 21:09 04/27/21 07:49 04/27/21 11:38  Glucose-Capillary 70 - 99 mg/dL 311 (H) 336 (H) 259 (H) 299 (H)  (H): Data is abnormally high Diabetes history: DM2 Outpatient Diabetes medications: Lantus 35 units daily Current orders for Inpatient glycemic control: Semglee 20 units daily, Novolog 0-20 units TID with meals & HS Prednisone 60 mg QD Inpatient Diabetes Program Recommendations:     Consider ordering Novolog 3 units TID with meals for meal coverage if patient eats at least 50% of meals.  Thanks, Bronson Curb, MSN, RNC-OB Diabetes Coordinator [phone] (8a-5p)

## 2021-04-27 NOTE — Discharge Summary (Signed)
Physician Discharge Summary   Patient: Troy Hicks MRN: [id number] DOB: December 06, 1919  Admit date:     04/18/2021  Discharge date: 04/27/21  Discharge Physician: Karie Kirks   PCP: [name], FNP   Recommendations at discharge:   Discharge to SNF for rehab. Chemistry to be drawn in one week and to be reported to facility physician. FSBS to be checked daily and reported to facility physician is <80 or >250. Follow up with PCP in 7-10 days after discharge from rehab. Chemistry should be drawn at that time to be reported to PCP.  Discharge Diagnoses: Principal Problem:   Intractable back pain Active Problems:   Thrombocytopenia (HCC)   Adjustment disorder with depressed mood   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   Chronic pain   Dementia (HCC)   Essential hypertension   Peripheral vascular disease (HCC)   Hyperglycemia due to type 2 diabetes mellitus ([place])   Hyperlipidemia   Hypothyroid   Lumbosacral spondylosis without myelopathy   Moderate recurrent major depression (HCC)   Insomnia   Chronic systolic CHF (congestive heart failure) (HCC)   CKD (chronic kidney disease) stage 4, GFR 15-29 ml/min (HCC)  Resolved Problems:   * No resolved hospital problems. *   Hospital Course: 88 y.o. male with medical history significant of diabetes, high thrombocytopenia, hyperlipidemia, peripheral vascular disease, atrial fibrillation, depression, hypothyroidism, CHF, gout, glaucoma, hypertension, dysphagia, dementia, CKD 3 who presented with ongoing back pain.  Patient has had chronic back pain for a very long time and over the last 6 months has had difficulty walking.  Follows with pain management.  Pain was thought to be particularly worse this past week.  Daughter discussed with patient's primary care provider who recommended bringing the patient to the emergency department.  MRI was considered however patient has IVC filter in place and as a result  MRI could not be done since the filter material could not be verified.  Apparently this filter was placed about 25 to 30 years ago at outside facility.  Patient was hospitalized for pain management.  Started on steroids.  Back pain has improved.  Subsequently developed hyponatremia which was initially thought to be due to hypovolemia for which she was given IV fluids.  Sodium started improving.  Now appears to be slightly volume overloaded.  Will be given Lasix today.  Assessment and Plan: * Intractable back pain- (present on admission) Patient with longstanding history of chronic back pain.  Has been on opioid pain medications.  Follows with pain management clinic.  Was brought in because of significant worsening in the pain.  No history of any falls or injuries recently.  No neurological deficits noted on examination.   MRI was initially ordered but could not be done since he has metal IVC filter.  This filter was apparently placed in outside facility more than 20 years ago.   I did try to call his cardiologist in [place] but have not heard back.  I also called [facility] in [place] where apparently the filter was placed and the radiology department does not have any records of the type of IVC that was placed.  So with this limited information we will not be able to do MRI in this patient.  Patient subsequently underwent CT of the lumbar spine which does suggest a significant spinal stenosis in the lumbar spine.  Findings were discussed with neurosurgery.   Pain likely from significant spinal stenosis and lumbar area.  No role  for any operative intervention currently.   After discussions with his daughter and since likelihood of infection is low patient was started on steroids.  Patient's pain has significantly improved compared to initial presentation.  He is more comfortable sitting upright.  Not constantly complaining of pain.  Mobility appears to have improved as well.  Continue PT  and OT.  Short-term rehab was recommended.  Changed over to oral steroids.  We will plan for a slow taper.  Can follow-up with neurosurgery in the next few weeks depending on pain and mobility.  CKD (chronic kidney disease) stage 4, GFR 15-29 ml/min (HCC)- (present on admission) Acute Kidney Injury Hyperkalemia Hyponatremia  Baseline renal function noted to be between 1.8-2.1. Creatinine 1.88 today. Creatinine did climb up to 2.67 with elevated BUN.  Elevated BUN likely from steroids. Patient has had poor oral intake for several days due to his pain issues.  He was likely dehydrated.  Patient was given IV fluids with improvement in renal function and is close to baseline. Potassium improved with Lokelma.   Sodium level dropped as low as 126. Urine sodium was less than 10, urine osmolality 319.  Thought to be secondary to hypovolemia.  Was given normal saline infusion with improvement in sodium.  Sodium came up to 131 today.    Some degree of volume overload is noted. Continue to monitor closely now that lasix has been resumed, although at 1/2 the frequency that he takes it at home.  Chronic systolic CHF (congestive heart failure) ([place])- (present on admission) Last echo in our system in 2021 showed EF less than 20%, G1 DD, RV function mildly reduced. Was somewhat hypokalemic during her reported admission.  Today is noted to have JVD and mild pedal edema.  Will be given a dose of Lasix today and we will see his response over the next 24 hours.  Patient was on Lasix 80 mg twice a day prior to admission which was held due to acute renal failure. Not a candidate for ACE inhibitor or ARB due to chronic kidney disease. Not on beta-blocker either. His furosemide was held due to worsening renal function and hyponatremia. It has now been resumed at 80 mg by mouth daily. Monitor creatinine, electrolytes and volume status.  Insomnia- (present on admission) Melatonin as needed.  Moderate recurrent major  depression ([place])- (present on admission) Noted to be on Lexapro and trazodone.  Lumbosacral spondylosis without myelopathy- (present on admission) Noted. Continue PT/OT and pain control.  Hypothyroid- (present on admission) Continue levothyroxine 100 mcg daily as at home.  Hyperlipidemia- (present on admission) Continue colestid as at home.  Hyperglycemia due to type 2 diabetes mellitus ([place])- (present on admission) HbA1c 6.6.  Prior to admission he was on Lantus.  It was held initially due to low glucose levels presumably from poor oral intake. Subsequently noted to have hyperglycemia from steroids.  Started back on Lantus.  Also placed on SSI and meal coverage. Noted to have hypoglycemia yesterday morning.  Insulin dose was adjusted.  Continue current dose for now.  CBG stable.  Peripheral vascular disease ([place])- (present on admission) Stable  Essential hypertension- (present on admission) Patient daughter informed me that he is no longer on losartan at home.  This has been discontinued. Blood pressure stable for the most part.  Dementia ([place])- (present on admission) Stable.  Continue home medication  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- (present on admission) Stable  Thrombocytopenia ([place])- (present on admission) Leukocytosis Normocytic anemia  Stable for the most part. Platelet counts have improved.  Lovenox was held a few days ago.  Remains on SCDs.     I have seen and examined this patient myself. I have spent 34 minutes in her evaluation and care.  Consultants: Phone discussion with neurosurgery by admitting team/EDP Procedures performed: None  Disposition: Rehabilitation facility Diet recommendation:  Discharge Diet Orders (From admission, onward)     Start     Ordered   04/27/21 0000  Diet - low sodium heart healthy        04/27/21 1201   04/27/21 0000  Diet Carb Modified        04/27/21 1201           Cardiac  and Carb modified diet  DISCHARGE MEDICATION: Allergies as of 04/27/2021       Reactions   Codeine    Other Rash        Medication List     STOP taking these medications    bacitracin ointment   colchicine 0.6 MG tablet   Oxycodone HCl 10 MG Tabs   oxyCODONE-acetaminophen 5-325 MG tablet Commonly known as: PERCOCET/ROXICET   traMADol 50 MG tablet Commonly known as: ULTRAM   traZODone 50 MG tablet Commonly known as: DESYREL   triamcinolone ointment 0.1 % Commonly known as: KENALOG       TAKE these medications    acetaminophen 500 MG tablet Commonly known as: TYLENOL Take 500 mg by mouth 3 (three) times daily.   Alcohol Wipes 70 % Pads USE FOR INSULIN INJECTIONS OR TO FINGER STICK AS DIRECTED   Alogliptin Benzoate 25 MG Tabs Take 25 mg by mouth daily.   aspirin EC 81 MG tablet Take 81 mg by mouth daily.   colestipol 1 g tablet Commonly known as: COLESTID Take 1 g by mouth every other day.   Dextran 70-Hypromellose (PF) 0.1-0.3 % Soln Place 1 drop into both eyes 3 (three) times daily.   escitalopram 10 MG tablet Commonly known as: LEXAPRO Take 10 mg by mouth daily.   furosemide 80 MG tablet Commonly known as: LASIX Take 1 tablet (80 mg total) by mouth daily. What changed: when to take this   HYDROcodone-acetaminophen 5-325 MG tablet Commonly known as: NORCO/VICODIN Take 1 tablet by mouth 3 (three) times daily as needed for moderate pain.   insulin glargine 100 UNIT/ML injection Commonly known as: LANTUS Inject 0.2 mLs (20 Units total) into the skin at bedtime. What changed: how much to take   KLOR-CON M20 PO Take 20 mEq by mouth daily.   latanoprost 0.005 % ophthalmic solution Commonly known as: XALATAN Place 1 drop into both eyes at bedtime.   levothyroxine 100 MCG tablet Commonly known as: SYNTHROID Take 100 mcg by mouth daily before breakfast.   lidocaine 5 % Commonly known as: LIDODERM Place 1 patch onto the skin daily. Remove  & Discard patch within 12 hours or as directed by MD   lipase/protease/amylase 36000 UNITS Cpep capsule Commonly known as: CREON See admin instructions.   loperamide 2 MG capsule Commonly known as: IMODIUM Take 2 mg by mouth as needed for diarrhea or loose stools. Do not exceed 6 doses in 24 hours.   melatonin 3 MG Tabs tablet Take 1 tablet (3 mg total) by mouth at bedtime.   polyethylene glycol 17 g packet Commonly known as: MIRALAX / GLYCOLAX Take 17 g by mouth daily.   predniSONE 20 MG tablet Commonly known as: DELTASONE Take 3 tablets (60  mg total) by mouth daily with breakfast for 3 days, THEN 2.5 tablets (50 mg total) daily with breakfast for 5 days, THEN 2 tablets (40 mg total) daily with breakfast for 5 days, THEN 1.5 tablets (30 mg total) daily with breakfast for 5 days, THEN 1 tablet (20 mg total) daily with breakfast for 5 days, THEN 0.5 tablets (10 mg total) daily with breakfast for 5 days. Start taking on: April 28, 2021   PreserVision AREDS 2 Caps See admin instructions.   senna 8.6 MG Tabs tablet Commonly known as: SENOKOT 2 tablets at bedtime as needed        Discharge Exam: Filed Weights   04/24/21 0500 04/25/21 0330 04/26/21 0420  Weight: 78.9 kg 78.7 kg 80.2 kg   Exam:   Constitutional:  The patient is awake, alert, and oriented x 3. No acute distress. Respiratory:  No increased work of breathing. No wheezes, rales, or rhonchi No tactile fremitus Cardiovascular:  Regular rate and rhythm No murmurs, ectopy, or gallups. No lateral PMI. No thrills. Abdomen:  Abdomen is soft, non-tender, non-distended No hernias, masses, or organomegaly Normoactive bowel sounds.  Musculoskeletal:  No cyanosis, clubbing, or edema Skin:  No rashes, lesions, ulcers palpation of skin: no induration or nodules Neurologic:  CN 2-12 intact Sensation all 4 extremities intact Psychiatric:  Mental status Mood, affect appropriate Orientation to person, place,  time  judgment and insight appear intact  Condition at discharge: fair  The results of significant diagnostics from this hospitalization (including imaging, microbiology, ancillary and laboratory) are listed below for reference.   Imaging Studies: CT ABDOMEN PELVIS WO CONTRAST  Result Date: 04/18/2021 CLINICAL DATA:  Flank pain, kidney stone suspected. Back pain and left hip pain. EXAM: CT ABDOMEN AND PELVIS WITHOUT CONTRAST TECHNIQUE: Multidetector CT imaging of the abdomen and pelvis was performed following the standard protocol without IV contrast. RADIATION DOSE REDUCTION: This exam was performed according to the departmental dose-optimization program which includes automated exposure control, adjustment of the mA and/or kV according to patient size and/or use of iterative reconstruction technique. COMPARISON:  None. FINDINGS: Lower chest: The heart is enlarged and there is no pericardial effusion. Coronary artery calcifications are noted. Bronchiectasis is noted in the lower lobes bilaterally and mild atelectasis is seen at the lung bases. Hepatobiliary: No focal liver abnormality is seen. No gallstones, gallbladder wall thickening, or biliary dilatation. Pancreas: Fatty atrophy is noted. No pancreatic ductal dilatation or surrounding inflammatory changes. Spleen: Normal in size without focal abnormality. Adrenals/Urinary Tract: The adrenal glands are within normal limits. A cyst is present in the mid right kidney measuring 1.7 cm. No renal calculus or hydronephrosis. The bladder is unremarkable. Stomach/Bowel: The stomach is within normal limits. No bowel obstruction, free air, or pneumatosis. Scattered diverticula are present along the colon without evidence of diverticulitis. The appendix is not visualized on exam. Vascular/Lymphatic: Aortic atherosclerotic calcification. Extensive vascular calcifications are noted in the abdomen and pelvis. An IVC filter is noted. No abdominal or pelvic  lymphadenopathy. Reproductive: The prostate gland is enlarged. Other: A small fat containing inguinal hernia is noted on the left. Small fat containing umbilical hernia. No ascites. Musculoskeletal: Sternotomy wires are noted over the midline. Degenerative changes are present in the thoracolumbar spine. There is bilateral spondylolysis at L5 with grade 2 anterolisthesis. IMPRESSION: 1. No acute intra-abdominal process. 2. No renal calculus or obstructive uropathy bilaterally. Right renal cyst. 3. Diverticulosis without diverticulitis. Electronically Signed   By: Brett Fairy M.D.   On: 04/18/2021  22:11   CT L-SPINE NO CHARGE  Result Date: 04/18/2021 CLINICAL DATA:  Back pain, left hip pain. EXAM: CT LUMBAR SPINE WITHOUT CONTRAST TECHNIQUE: Multidetector CT imaging of the lumbar spine was performed without intravenous contrast administration. Multiplanar CT image reconstructions were also generated. RADIATION DOSE REDUCTION: This exam was performed according to the departmental dose-optimization program which includes automated exposure control, adjustment of the mA and/or kV according to patient size and/or use of iterative reconstruction technique. COMPARISON:  04/18/2021. FINDINGS: Segmentation: 5 lumbar type vertebrae. Alignment: There is bilateral spondylolysis at L5 with grade 2 anterolisthesis at L5-S1. Mild retrolisthesis is present at L2-L3. There is dextroscoliosis of the lumbar spine. Vertebrae: No acute fracture. There is partial fusion of the T12, L1, and L2 vertebral bodies. Degenerative changes are present at the sacroiliac joints bilaterally Paraspinal and other soft tissues: No acute abnormality. Disc levels: Vacuum disc phenomena is noted from L2-S1. L1-L2: There is partial fusion of the L1-L2 vertebral bodies. Disc osteophyte complexes and facet arthropathy is noted resulting in moderate spinal canal and mild neural foraminal stenosis bilaterally. L2-L3: There disc osteophyte complexes with  facet arthropathy resulting in moderate spinal canal stenosis. There is moderate neural foraminal stenosis on left and mild neural foraminal stenosis on the right. L3-L4: There is a disc osteophyte complex with facet arthropathy resulting in severe spinal canal and left neural foraminal stenosis. The right neural foramen is within normal limits. L4-L5: There is a disc herniation with endplate osteophyte formation and facet arthropathy resulting in severe spinal canal stenosis. Moderate neural foraminal stenosis is present bilaterally. L5-S1: There is a disc herniation with osteophyte formation and facet arthropathy resulting in moderate to severe spinal canal stenosis. Moderate neural foraminal stenosis is present bilaterally. IMPRESSION: 1. No acute fracture. 2. Dextroscoliosis. 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 3. Moderate to severe degenerative changes and spinal canal stenosis in the lumbar spine. Electronically Signed   By: Brett Fairy M.D.   On: 04/18/2021 22:21   DG Abd Portable 1 View  Result Date: 04/18/2021 CLINICAL DATA:  MRI clearance. EXAM: PORTABLE ABDOMEN - 1 VIEW COMPARISON:  None. FINDINGS: Sternotomy wires and mediastinal surgical clips are present. IVC filter is present. There are dense calcifications of the splenic artery. Bowel-gas pattern is nonobstructive. There is average stool burden. No suspicious calcifications. There is curvature of the lumbar spine with multilevel degenerative change. IMPRESSION: 1. Sternotomy wires, mediastinal clips and IVC filter present. 2. Nonobstructive bowel gas pattern. Electronically Signed   By: Ronney Asters M.D.   On: 04/18/2021 19:44    Microbiology: Results for orders placed or performed during the hospital encounter of 04/18/21  Resp Panel by RT-PCR (Flu A&B, Covid) Nasopharyngeal Swab     Status: None   Collection Time: 04/19/21 10:10 AM   Specimen: Nasopharyngeal Swab; Nasopharyngeal(NP) swabs in vial  transport medium  Result Value Ref Range Status   SARS Coronavirus 2 by RT PCR NEGATIVE NEGATIVE Final    Comment: (NOTE) SARS-CoV-2 target nucleic acids are NOT DETECTED.  The SARS-CoV-2 RNA is generally detectable in upper respiratory specimens during the acute phase of infection. The lowest concentration of SARS-CoV-2 viral copies this assay can detect is 138 copies/mL. A negative result does not preclude SARS-Cov-2 infection and should not be used as the sole basis for treatment or other patient management decisions. A negative result may occur with  improper specimen collection/handling, submission of specimen other than nasopharyngeal swab, presence of viral mutation(s) within the  areas targeted by this assay, and inadequate number of viral copies(<138 copies/mL). A negative result must be combined with clinical observations, patient history, and epidemiological information. The expected result is Negative.  Fact Sheet for Patients:  EntrepreneurPulse.com.au  Fact Sheet for Healthcare Providers:  IncredibleEmployment.be  This test is no t yet approved or cleared by the [place] FDA and  has been authorized for detection and/or diagnosis of SARS-CoV-2 by FDA under an Emergency Use Authorization (EUA). This EUA will remain  in effect (meaning this test can be used) for the duration of the COVID-19 declaration under Section 564(b)(1) of the Act, 21 U.S.C.section 360bbb-3(b)(1), unless the authorization is terminated  or revoked sooner.       Influenza A by PCR NEGATIVE NEGATIVE Final   Influenza B by PCR NEGATIVE NEGATIVE Final    Comment: (NOTE) The Xpert Xpress SARS-CoV-2/FLU/RSV plus assay is intended as an aid in the diagnosis of influenza from Nasopharyngeal swab specimens and should not be used as a sole basis for treatment. Nasal washings and aspirates are unacceptable for Xpert Xpress SARS-CoV-2/FLU/RSV testing.  Fact  Sheet for Patients: EntrepreneurPulse.com.au  Fact Sheet for Healthcare Providers: IncredibleEmployment.be  This test is not yet approved or cleared by the [place] FDA and has been authorized for detection and/or diagnosis of SARS-CoV-2 by FDA under an Emergency Use Authorization (EUA). This EUA will remain in effect (meaning this test can be used) for the duration of the COVID-19 declaration under Section 564(b)(1) of the Act, 21 U.S.C. section 360bbb-3(b)(1), unless the authorization is terminated or revoked.  Performed at [facility], [place] [address] [place], [place] [address]     Labs: CBC: Recent Labs  Lab 04/23/21 [phone] 04/24/21 0413 04/25/21 0142 04/26/21 0620 04/27/21 0710  WBC 9.3 13.8* 14.6* 13.6* 11.5*  HGB 11.7* 11.5* 11.5* 11.4* 11.3*  HCT 34.6* 32.6* 33.7* 33.4* 32.8*  MCV 95.3 93.9 94.9 94.9 95.3  PLT 97* 112* 100* 99* 408*   Basic Metabolic Panel: Recent Labs  Lab 04/23/21 0213 04/24/21 0413 04/25/21 0142 04/26/21 0620 04/27/21 0406  NA 128* 132* 129* 131* 130*  K 4.4 4.1 5.0 5.1 5.2*  CL 96* 102 103 102 101  CO2 21* 21* 20* 18* 17*  GLUCOSE 159* 43* 169* 202* 344*  BUN 97* 94* 95* 90* 86*  CREATININE 2.29* 2.16* 1.80* 1.88* 1.84*  CALCIUM 8.2* 7.8* 7.8* 7.9* 7.8*   Liver Function Tests: No results for input(s): AST, ALT, ALKPHOS, BILITOT, PROT, ALBUMIN in the last 168 hours. CBG: Recent Labs  Lab 04/26/21 1204 04/26/21 1500 04/26/21 2109 04/27/21 0749 04/27/21 1138  GLUCAP 232* 311* 336* 259* 299*    Discharge time spent: greater than 30 minutes.  Signed: Herbie Lehrmann, DO [facility] 04/27/2021

## 2021-04-27 NOTE — Progress Notes (Signed)
SATURATION QUALIFICATIONS: (This note is used to comply with regulatory documentation for home oxygen)  Patient Saturations on Room Air at Rest = 94%  Patient Saturations on Room Air while Ambulating = 91%  Patient Saturations on n/a Liters of oxygen while Ambulating = n/a%  Please briefly explain why patient needs home oxygen: No need for home O2 at d/c.  Magda Kiel, PT Acute Rehabilitation Services Pager:[phone] Office:[phone] 04/27/2021

## 2021-04-27 NOTE — Progress Notes (Signed)
Physical Therapy Treatment Patient Details Name: Troy Hicks MRN: [id number] DOB: Mar 20, 1919 Today's Date: 04/27/2021   History of Present Illness 88 y.o. male with medical history significant of diabetes, high thrombocytopenia, hyperlipidemia, peripheral vascular disease, atrial fibrillation, depression, hypothyroidism, CHF, gout, glaucoma, hypertension, dysphagia, dementia, CKD 3 who presented with ongoing back pain.    PT Comments    Patient progressing slowly, but planned d/c to rehab today with increased frequency of therapy should progress faster.  Patient with SpO2 WNL throughout ambulation on RA in the room.  Able to sit and complete oral care with min A.  Patient will benefit from continued skilled PT until d/c.  Continues to be appropriate for SNF level rehab at d/c.   Recommendations for follow up therapy are one component of a multi-disciplinary discharge planning process, led by the attending physician.  Recommendations may be updated based on patient status, additional functional criteria and insurance authorization.  Follow Up Recommendations  Skilled nursing-short term rehab (<3 hours/day)     Assistance Recommended at Discharge Frequent or constant Supervision/Assistance  Patient can return home with the following A little help with walking and/or transfers;Assistance with cooking/housework;Assist for transportation;Help with stairs or ramp for entrance;A little help with bathing/dressing/bathroom   Equipment Recommendations  None recommended by PT    Recommendations for Other Services       Precautions / Restrictions Precautions Precautions: Fall     Mobility  Bed Mobility Overal bed mobility: Needs Assistance Bed Mobility: Rolling, Sidelying to Sit Rolling: Min assist Sidelying to sit: Mod assist     Sit to sidelying: Mod assist General bed mobility comments: cues for using rail and assist for trunk to upright and for legs to supine    Transfers Overall  transfer level: Needs assistance Equipment used: Rolling walker (2 wheels) Transfers: Sit to/from Stand Sit to Stand: Mod assist, Min assist   Step pivot transfers: Min assist       General transfer comment: up from EOB to RW with some lifting help, then back to bed step pivot with RW and min A    Ambulation/Gait Ambulation/Gait assistance: Min assist Gait Distance (Feet): 40 Feet Assistive device: Rolling walker (2 wheels) Gait Pattern/deviations: Step-through pattern, Decreased stride length, Trunk flexed, Shuffle       General Gait Details: flexed posture and increased assist for balance/safety with walker as pt fatigues   Stairs             Wheelchair Mobility    Modified Rankin (Stroke Patients Only)       Balance Overall balance assessment: Needs assistance   Sitting balance-Leahy Scale: Fair     Standing balance support: Reliant on assistive device for balance, Bilateral upper extremity supported Standing balance-Leahy Scale: Poor                              Cognition Arousal/Alertness: Awake/alert Behavior During Therapy: WFL for tasks assessed/performed Overall Cognitive Status: History of cognitive impairments - at baseline                                          Exercises General Exercises - Lower Extremity Ankle Circles/Pumps: AROM, Both, 10 reps, Seated Long Arc Quad: AROM, Both, 10 reps, Seated Hip Flexion/Marching: AROM, Both, 10 reps, Seated Other Exercises Other Exercises: stretch in figure 4 (modified x 10  sec each leg with assist) Other Exercises: stretch to hamstrings bilateral ~10s each leg    General Comments General comments (skin integrity, edema, etc.): mild SOB with activity, SpO2 remained 91-97% on RA with ambulation      Pertinent Vitals/Pain Pain Assessment Faces Pain Scale: Hurts little more Pain Location: Low back Pain Descriptors / Indicators: Tightness Pain Intervention(s):  Monitored during session, Repositioned    Home Living                          Prior Function            PT Goals (current goals can now be found in the care plan section) Progress towards PT goals: Progressing toward goals    Frequency    Min 2X/week      PT Plan Current plan remains appropriate    Co-evaluation              AM-PAC PT "6 Clicks" Mobility   Outcome Measure  Help needed turning from your back to your side while in a flat bed without using bedrails?: A Lot Help needed moving from lying on your back to sitting on the side of a flat bed without using bedrails?: A Lot Help needed moving to and from a bed to a chair (including a wheelchair)?: A Lot Help needed standing up from a chair using your arms (e.g., wheelchair or bedside chair)?: A Lot Help needed to walk in hospital room?: A Lot Help needed climbing 3-5 steps with a railing? : Total 6 Click Score: 11    End of Session Equipment Utilized During Treatment: Gait belt Activity Tolerance: Patient tolerated treatment well Patient left: in bed;with call bell/phone within reach   PT Visit Diagnosis: Other abnormalities of gait and mobility (R26.89);Muscle weakness (generalized) (M62.81)     Time: 0355-9741 PT Time Calculation (min) (ACUTE ONLY): 32 min  Charges:  $Gait Training: 8-22 mins $Therapeutic Activity: 8-22 mins                     Magda Kiel, PT Acute Rehabilitation Services Pager:[phone] Office:[phone] 04/27/2021    [name] 04/27/2021, 4:55 PM

## 2021-04-27 NOTE — TOC Progression Note (Signed)
Transition of Care [facility]) - Progression Note    Patient Details  Name: Troy Hicks MRN: [id number] Date of Birth: Jan 15, 1920  Transition of Care [facility]) CM/SW [place], [place] Phone Number: 04/27/2021, 9:02 AM  Clinical Narrative:     Auth approved 04/27/2021-05/01/2021 Ref# [id number]  Expected Discharge Plan: Skilled Nursing Facility Barriers to Discharge: Barriers Resolved  Expected Discharge Plan and Services Expected Discharge Plan: [place] In-house Referral: Clinical Social Work     Living arrangements for the past 2 months: [place]                                       Social Determinants of Health (SDOH) Interventions    Readmission Risk Interventions No flowsheet data found.

## 2021-05-15 ENCOUNTER — Inpatient Hospital Stay (HOSPITAL_COMMUNITY)
Admission: EM | Admit: 2021-05-15 | Discharge: 2021-05-20 | DRG: 291 | Disposition: A | Discharge status: Hospice | Payer: Medicare Other | Attending: Internal Medicine | Admitting: Internal Medicine

## 2021-05-15 ENCOUNTER — Other Ambulatory Visit: Payer: Self-pay

## 2021-05-15 ENCOUNTER — Emergency Department (HOSPITAL_COMMUNITY): Payer: Medicare Other

## 2021-05-15 DIAGNOSIS — Z6827 Body mass index (BMI) 27.0-27.9, adult: Secondary | ICD-10-CM

## 2021-05-15 DIAGNOSIS — E1151 Type 2 diabetes mellitus with diabetic peripheral angiopathy without gangrene: Secondary | ICD-10-CM | POA: Diagnosis present

## 2021-05-15 DIAGNOSIS — Z7982 Long term (current) use of aspirin: Secondary | ICD-10-CM

## 2021-05-15 DIAGNOSIS — F32A Depression, unspecified: Secondary | ICD-10-CM | POA: Diagnosis present

## 2021-05-15 DIAGNOSIS — F0394 Unspecified dementia, unspecified severity, with anxiety: Secondary | ICD-10-CM | POA: Diagnosis present

## 2021-05-15 DIAGNOSIS — Z79899 Other long term (current) drug therapy: Secondary | ICD-10-CM

## 2021-05-15 DIAGNOSIS — M5459 Other low back pain: Secondary | ICD-10-CM | POA: Diagnosis present

## 2021-05-15 DIAGNOSIS — Z20822 Contact with and (suspected) exposure to covid-19: Secondary | ICD-10-CM | POA: Diagnosis present

## 2021-05-15 DIAGNOSIS — Z888 Allergy status to other drugs, medicaments and biological substances status: Secondary | ICD-10-CM

## 2021-05-15 DIAGNOSIS — R682 Dry mouth, unspecified: Secondary | ICD-10-CM | POA: Diagnosis present

## 2021-05-15 DIAGNOSIS — E1122 Type 2 diabetes mellitus with diabetic chronic kidney disease: Secondary | ICD-10-CM | POA: Diagnosis present

## 2021-05-15 DIAGNOSIS — I5084 End stage heart failure: Secondary | ICD-10-CM | POA: Diagnosis present

## 2021-05-15 DIAGNOSIS — Z87891 Personal history of nicotine dependence: Secondary | ICD-10-CM

## 2021-05-15 DIAGNOSIS — Z515 Encounter for palliative care: Secondary | ICD-10-CM | POA: Diagnosis not present

## 2021-05-15 DIAGNOSIS — R0600 Dyspnea, unspecified: Secondary | ICD-10-CM | POA: Diagnosis not present

## 2021-05-15 DIAGNOSIS — I509 Heart failure, unspecified: Secondary | ICD-10-CM

## 2021-05-15 DIAGNOSIS — Z8673 Personal history of transient ischemic attack (TIA), and cerebral infarction without residual deficits: Secondary | ICD-10-CM

## 2021-05-15 DIAGNOSIS — I130 Hypertensive heart and chronic kidney disease with heart failure and stage 1 through stage 4 chronic kidney disease, or unspecified chronic kidney disease: Secondary | ICD-10-CM | POA: Diagnosis present

## 2021-05-15 DIAGNOSIS — M4307 Spondylolysis, lumbosacral region: Secondary | ICD-10-CM | POA: Diagnosis present

## 2021-05-15 DIAGNOSIS — N1832 Chronic kidney disease, stage 3b: Secondary | ICD-10-CM | POA: Diagnosis not present

## 2021-05-15 DIAGNOSIS — I2581 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: Secondary | ICD-10-CM | POA: Diagnosis present

## 2021-05-15 DIAGNOSIS — Z789 Other specified health status: Secondary | ICD-10-CM | POA: Diagnosis not present

## 2021-05-15 DIAGNOSIS — Z66 Do not resuscitate: Secondary | ICD-10-CM | POA: Diagnosis present

## 2021-05-15 DIAGNOSIS — M545 Low back pain, unspecified: Secondary | ICD-10-CM | POA: Diagnosis present

## 2021-05-15 DIAGNOSIS — M109 Gout, unspecified: Secondary | ICD-10-CM | POA: Diagnosis present

## 2021-05-15 DIAGNOSIS — F411 Generalized anxiety disorder: Secondary | ICD-10-CM | POA: Diagnosis present

## 2021-05-15 DIAGNOSIS — G8929 Other chronic pain: Secondary | ICD-10-CM | POA: Diagnosis present

## 2021-05-15 DIAGNOSIS — Z8249 Family history of ischemic heart disease and other diseases of the circulatory system: Secondary | ICD-10-CM

## 2021-05-15 DIAGNOSIS — Z833 Family history of diabetes mellitus: Secondary | ICD-10-CM

## 2021-05-15 DIAGNOSIS — Z7189 Other specified counseling: Secondary | ICD-10-CM | POA: Diagnosis not present

## 2021-05-15 DIAGNOSIS — Z885 Allergy status to narcotic agent status: Secondary | ICD-10-CM | POA: Diagnosis not present

## 2021-05-15 DIAGNOSIS — I482 Chronic atrial fibrillation, unspecified: Secondary | ICD-10-CM | POA: Diagnosis present

## 2021-05-15 DIAGNOSIS — I5023 Acute on chronic systolic (congestive) heart failure: Secondary | ICD-10-CM | POA: Diagnosis present

## 2021-05-15 DIAGNOSIS — N184 Chronic kidney disease, stage 4 (severe): Secondary | ICD-10-CM | POA: Diagnosis present

## 2021-05-15 DIAGNOSIS — E785 Hyperlipidemia, unspecified: Secondary | ICD-10-CM | POA: Diagnosis present

## 2021-05-15 DIAGNOSIS — I255 Ischemic cardiomyopathy: Secondary | ICD-10-CM | POA: Diagnosis present

## 2021-05-15 DIAGNOSIS — F0390 Unspecified dementia without behavioral disturbance: Secondary | ICD-10-CM | POA: Diagnosis present

## 2021-05-15 DIAGNOSIS — D696 Thrombocytopenia, unspecified: Secondary | ICD-10-CM | POA: Diagnosis present

## 2021-05-15 DIAGNOSIS — Z794 Long term (current) use of insulin: Secondary | ICD-10-CM | POA: Diagnosis not present

## 2021-05-15 DIAGNOSIS — Z79890 Hormone replacement therapy (postmenopausal): Secondary | ICD-10-CM

## 2021-05-15 DIAGNOSIS — R0602 Shortness of breath: Secondary | ICD-10-CM | POA: Diagnosis present

## 2021-05-15 DIAGNOSIS — F419 Anxiety disorder, unspecified: Secondary | ICD-10-CM | POA: Diagnosis not present

## 2021-05-15 DIAGNOSIS — E039 Hypothyroidism, unspecified: Secondary | ICD-10-CM | POA: Diagnosis present

## 2021-05-15 DIAGNOSIS — I2510 Atherosclerotic heart disease of native coronary artery without angina pectoris: Secondary | ICD-10-CM | POA: Diagnosis present

## 2021-05-15 DIAGNOSIS — I4891 Unspecified atrial fibrillation: Secondary | ICD-10-CM

## 2021-05-15 DIAGNOSIS — I5021 Acute systolic (congestive) heart failure: Secondary | ICD-10-CM | POA: Diagnosis not present

## 2021-05-15 DIAGNOSIS — Z7952 Long term (current) use of systemic steroids: Secondary | ICD-10-CM

## 2021-05-15 DIAGNOSIS — R262 Difficulty in walking, not elsewhere classified: Secondary | ICD-10-CM | POA: Diagnosis present

## 2021-05-15 LAB — GLUCOSE, CAPILLARY: Glucose-Capillary: 345 mg/dL — ABNORMAL HIGH (ref 70–99)

## 2021-05-15 LAB — TSH: TSH: 1.227 u[IU]/mL (ref 0.350–4.500)

## 2021-05-15 LAB — CBC
HCT: 29.5 % — ABNORMAL LOW (ref 39.0–52.0)
Hemoglobin: 9.4 g/dL — ABNORMAL LOW (ref 13.0–17.0)
MCH: 31.5 pg (ref 26.0–34.0)
MCHC: 31.9 g/dL (ref 30.0–36.0)
MCV: 99 fL (ref 80.0–100.0)
Platelets: 90 10*3/uL — ABNORMAL LOW (ref 150–400)
RBC: 2.98 MIL/uL — ABNORMAL LOW (ref 4.22–5.81)
RDW: 14.6 % (ref 11.5–15.5)
WBC: 14 10*3/uL — ABNORMAL HIGH (ref 4.0–10.5)
nRBC: 0 % (ref 0.0–0.2)

## 2021-05-15 LAB — BASIC METABOLIC PANEL
Anion gap: 12 (ref 5–15)
BUN: 52 mg/dL — ABNORMAL HIGH (ref 8–23)
CO2: 20 mmol/L — ABNORMAL LOW (ref 22–32)
Calcium: 7.7 mg/dL — ABNORMAL LOW (ref 8.9–10.3)
Chloride: 103 mmol/L (ref 98–111)
Creatinine, Ser: 1.76 mg/dL — ABNORMAL HIGH (ref 0.61–1.24)
GFR, Estimated: 34 mL/min — ABNORMAL LOW (ref >60)
Glucose, Bld: 234 mg/dL — ABNORMAL HIGH (ref 70–99)
Potassium: 5.1 mmol/L (ref 3.5–5.1)
Sodium: 135 mmol/L (ref 135–145)

## 2021-05-15 LAB — RESP PANEL BY RT-PCR (FLU A&B, COVID) ARPGX2
Influenza A by PCR: NEGATIVE
Influenza B by PCR: NEGATIVE
SARS Coronavirus 2 by RT PCR: NEGATIVE

## 2021-05-15 LAB — TROPONIN I (HIGH SENSITIVITY)
Troponin I (High Sensitivity): 164 ng/L (ref <18)
Troponin I (High Sensitivity): 429 ng/L (ref <18)

## 2021-05-15 LAB — MAGNESIUM: Magnesium: 2 mg/dL (ref 1.7–2.4)

## 2021-05-15 LAB — PROTIME-INR
INR: 1.1 (ref 0.8–1.2)
Prothrombin Time: 14.1 seconds (ref 11.4–15.2)

## 2021-05-15 MED ORDER — ACETAMINOPHEN 325 MG PO TABS: 650.0000 mg | ORAL_TABLET | Freq: Four times a day (QID) | ORAL | Status: DC | PRN

## 2021-05-15 MED ORDER — POTASSIUM CHLORIDE CRYS ER 20 MEQ PO TBCR
20.0000 meq | EXTENDED_RELEASE_TABLET | Freq: Every day | ORAL | Status: DC
  Administered 2021-05-16: 20 meq via ORAL
  Filled 2021-05-15 (×3): qty 1

## 2021-05-15 MED ORDER — ASPIRIN EC 81 MG PO TBEC
81.0000 mg | DELAYED_RELEASE_TABLET | Freq: Every day | ORAL | Status: DC
  Administered 2021-05-16 – 2021-05-17 (×2): 81 mg via ORAL
  Filled 2021-05-15 (×3): qty 1

## 2021-05-15 MED ORDER — DIGOXIN 0.25 MG/ML IJ SOLN
0.1250 mg | Freq: Once | INTRAMUSCULAR | Status: AC
  Administered 2021-05-15: 0.125 mg via INTRAVENOUS
  Filled 2021-05-15: qty 2

## 2021-05-15 MED ORDER — LATANOPROST 0.005 % OP SOLN
1.0000 [drp] | Freq: Every day | OPHTHALMIC | Status: DC
  Administered 2021-05-16 – 2021-05-18 (×3): 1 [drp] via OPHTHALMIC
  Filled 2021-05-15: qty 2.5

## 2021-05-15 MED ORDER — ALOGLIPTIN BENZOATE 25 MG PO TABS: 25.0000 mg | ORAL_TABLET | Freq: Every day | ORAL | Status: DC

## 2021-05-15 MED ORDER — INSULIN ASPART 100 UNIT/ML IJ SOLN: 0.0000 [IU] | Freq: Three times a day (TID) | INTRAMUSCULAR | Status: DC

## 2021-05-15 MED ORDER — PREDNISONE 20 MG PO TABS
20.0000 mg | ORAL_TABLET | Freq: Every day | ORAL | Status: DC
Start: 2021-05-16 — End: 2021-05-20
  Administered 2021-05-16 – 2021-05-19 (×4): 20 mg via ORAL
  Filled 2021-05-15 (×4): qty 1

## 2021-05-15 MED ORDER — MIDODRINE HCL 5 MG PO TABS
5.0000 mg | ORAL_TABLET | Freq: Three times a day (TID) | ORAL | Status: DC
  Administered 2021-05-15 – 2021-05-17 (×7): 5 mg via ORAL
  Filled 2021-05-15 (×7): qty 1

## 2021-05-15 MED ORDER — MORPHINE SULFATE (PF) 2 MG/ML IV SOLN: 0.5000 mg | INTRAVENOUS | Status: DC | PRN

## 2021-05-15 MED ORDER — HYDROCODONE-ACETAMINOPHEN 5-325 MG PO TABS: 1.0000 | ORAL_TABLET | Freq: Three times a day (TID) | ORAL | Status: DC | PRN

## 2021-05-15 MED ORDER — ACETAMINOPHEN 650 MG RE SUPP: 650.0000 mg | Freq: Four times a day (QID) | RECTAL | Status: DC | PRN

## 2021-05-15 MED ORDER — LEVOTHYROXINE SODIUM 100 MCG PO TABS
100.0000 ug | ORAL_TABLET | Freq: Every day | ORAL | Status: DC
  Administered 2021-05-16 – 2021-05-17 (×2): 100 ug via ORAL
  Filled 2021-05-15 (×2): qty 1

## 2021-05-15 MED ORDER — FUROSEMIDE 10 MG/ML IJ SOLN
40.0000 mg | Freq: Once | INTRAMUSCULAR | Status: AC
Start: 2021-05-15 — End: 2021-05-15
  Administered 2021-05-15: 40 mg via INTRAVENOUS
  Filled 2021-05-15: qty 4

## 2021-05-15 MED ORDER — MELATONIN 3 MG PO TABS
3.0000 mg | ORAL_TABLET | Freq: Every day | ORAL | Status: DC
  Filled 2021-05-15: qty 1

## 2021-05-15 MED ORDER — PANCRELIPASE (LIP-PROT-AMYL) 36000-114000 UNITS PO CPEP: 36000.0000 [IU] | ORAL_CAPSULE | Freq: Three times a day (TID) | ORAL | Status: DC

## 2021-05-15 MED ORDER — DILTIAZEM LOAD VIA INFUSION
20.0000 mg | Freq: Once | INTRAVENOUS | Status: AC
  Administered 2021-05-15: 20 mg via INTRAVENOUS
  Filled 2021-05-15: qty 20

## 2021-05-15 MED ORDER — INSULIN ASPART 100 UNIT/ML IJ SOLN
0.0000 [IU] | Freq: Three times a day (TID) | INTRAMUSCULAR | Status: DC
  Administered 2021-05-16: 7 [IU] via SUBCUTANEOUS
  Administered 2021-05-16: 2 [IU] via SUBCUTANEOUS
  Administered 2021-05-17: 1 [IU] via SUBCUTANEOUS
  Administered 2021-05-17: 2 [IU] via SUBCUTANEOUS
  Administered 2021-05-17 – 2021-05-18 (×3): 5 [IU] via SUBCUTANEOUS
  Administered 2021-05-18: 1 [IU] via SUBCUTANEOUS
  Administered 2021-05-18: 7 [IU] via SUBCUTANEOUS
  Administered 2021-05-19: 2 [IU] via SUBCUTANEOUS
  Administered 2021-05-19: 5 [IU] via SUBCUTANEOUS
  Administered 2021-05-19: 7 [IU] via SUBCUTANEOUS

## 2021-05-15 MED ORDER — COLESTIPOL HCL 1 G PO TABS
1.0000 g | ORAL_TABLET | ORAL | Status: DC
  Administered 2021-05-16: 1 g via ORAL
  Filled 2021-05-15: qty 1

## 2021-05-15 MED ORDER — DILTIAZEM HCL-DEXTROSE 125-5 MG/125ML-% IV SOLN (PREMIX)
5.0000 mg/h | INTRAVENOUS | Status: DC
  Administered 2021-05-15: 5 mg/h via INTRAVENOUS
  Filled 2021-05-15: qty 125

## 2021-05-15 MED ORDER — HEPARIN SODIUM (PORCINE) 5000 UNIT/ML IJ SOLN
5000.0000 [IU] | Freq: Three times a day (TID) | INTRAMUSCULAR | Status: DC
  Administered 2021-05-16 – 2021-05-17 (×6): 5000 [IU] via SUBCUTANEOUS
  Filled 2021-05-15 (×6): qty 1

## 2021-05-15 MED ORDER — ESCITALOPRAM OXALATE 10 MG PO TABS
10.0000 mg | ORAL_TABLET | Freq: Every day | ORAL | Status: DC
  Administered 2021-05-16 – 2021-05-19 (×4): 10 mg via ORAL
  Filled 2021-05-15 (×5): qty 1

## 2021-05-15 MED ORDER — FUROSEMIDE 10 MG/ML IJ SOLN
40.0000 mg | Freq: Two times a day (BID) | INTRAMUSCULAR | Status: DC
  Administered 2021-05-16: 40 mg via INTRAVENOUS
  Filled 2021-05-15: qty 4

## 2021-05-15 MED ORDER — INSULIN GLARGINE-YFGN 100 UNIT/ML ~~LOC~~ SOLN
10.0000 [IU] | Freq: Every day | SUBCUTANEOUS | Status: DC
  Administered 2021-05-16 – 2021-05-18 (×4): 10 [IU] via SUBCUTANEOUS
  Filled 2021-05-15 (×5): qty 0.1

## 2021-05-15 NOTE — ED Notes (Signed)
Lab called with critical value: troponin 164. MD notified.

## 2021-05-15 NOTE — ED Notes (Signed)
Bladder scanner is missing in department. Will continue to look for bladder scanner.

## 2021-05-15 NOTE — ED Provider Notes (Signed)
[facility] EMERGENCY DEPARTMENT Provider Note   CSN: [id number] Arrival date & time: 05/15/21  1507     History  Chief Complaint  Patient presents with   Shortness of [place] is a 88 y.o. male.  Pt is a 88 yo with dm, htn, ckd, oa, cad, afib, syncope, tia, and chronic back pain.  He presents to the ED today with sob.  Pt has been at [facility] for rehab and has been there for just a few days.  He was admitted from 1/31 to 2/9 with intractable back pain.  Pain managed with steroids.  He does have a hx of CHF (EF in 2021 less than 20%).  Lasix was held for a little while while admitted due to AKI, but it was resumed upon d/c.  Rehab gave pt an extra lasix 80 mg today due to sob.  Pt also has a hx of afib, but is not on blood thinners as he is a fall risk.  Pt's daughter said sob started last night.  Back pain has been tolerable.         Home Medications Prior to Admission medications   Medication Sig Start Date End Date Taking? Authorizing Provider  traZODone (DESYREL) 50 MG tablet Take 25 mg by mouth at bedtime. Pt's daughter at bedside stated pt has been taking for years.   Yes [provider]  acetaminophen (TYLENOL) 500 MG tablet Take 500 mg by mouth 3 (three) times daily.     [provider]  Alcohol Swabs (ALCOHOL WIPES) 70 % PADS USE FOR INSULIN INJECTIONS OR TO FINGER STICK AS DIRECTED 03/29/20   [provider]  Alogliptin Benzoate 25 MG TABS Take 25 mg by mouth daily.     [provider]  aspirin EC 81 MG tablet Take 81 mg by mouth daily.    [provider]  colestipol (COLESTID) 1 g tablet Take 1 g by mouth every other day.     [provider]  Dextran 70-Hypromellose, PF, 0.1-0.3 % SOLN Place 1 drop into both eyes 3 (three) times daily.    [provider]  escitalopram (LEXAPRO) 10 MG tablet Take 10 mg by mouth daily.    [provider]  furosemide (LASIX) 80 MG tablet  Take 1 tablet (80 mg total) by mouth daily. 04/27/21   Swayze, Ava, DO  HYDROcodone-acetaminophen (NORCO/VICODIN) 5-325 MG tablet Take 1 tablet by mouth 3 (three) times daily as needed for moderate pain. 04/27/21   Swayze, Ava, DO  insulin glargine (LANTUS) 100 UNIT/ML injection Inject 0.2 mLs (20 Units total) into the skin at bedtime. 04/27/21   Swayze, Ava, DO  latanoprost (XALATAN) 0.005 % ophthalmic solution Place 1 drop into both eyes at bedtime. 03/16/20   [provider]  levothyroxine (SYNTHROID, LEVOTHROID) 100 MCG tablet Take 100 mcg by mouth daily before breakfast.    [provider]  lidocaine (LIDODERM) 5 % Place 1 patch onto the skin daily. Remove & Discard patch within 12 hours or as directed by MD 04/27/21   Swayze, Ava, DO  lipase/protease/amylase (CREON) 36000 UNITS CPEP capsule See admin instructions.    [provider]  loperamide (IMODIUM) 2 MG capsule Take 2 mg by mouth as needed for diarrhea or loose stools. Do not exceed 6 doses in 24 hours. Patient not taking: Reported on 04/19/2021    [provider]  melatonin 3 MG TABS tablet Take 1 tablet (3 mg total) by mouth  at bedtime. 04/27/21   Swayze, Ava, DO  Multiple Vitamins-Minerals (PRESERVISION AREDS 2) CAPS See admin instructions.    [provider]  polyethylene glycol (MIRALAX / GLYCOLAX) 17 g packet Take 17 g by mouth daily.    [provider]  Potassium Chloride Crys ER (KLOR-CON M20 PO) Take 20 mEq by mouth daily.    [provider]  predniSONE (DELTASONE) 20 MG tablet Take 3 tablets (60 mg total) by mouth daily with breakfast for 3 days, THEN 2.5 tablets (50 mg total) daily with breakfast for 5 days, THEN 2 tablets (40 mg total) daily with breakfast for 5 days, THEN 1.5 tablets (30 mg total) daily with breakfast for 5 days, THEN 1 tablet (20 mg total) daily with breakfast for 5 days, THEN 0.5 tablets (10 mg total) daily with breakfast for 5 days. 04/28/21 05/26/21  Swayze,  Ava, DO  senna (SENOKOT) 8.6 MG TABS tablet 2 tablets at bedtime as needed    [provider]      Allergies    Codeine and Other    Review of Systems   Review of Systems  Respiratory:  Positive for shortness of breath.   Cardiovascular:  Positive for palpitations and leg swelling.  All other systems reviewed and are negative.  Physical Exam Updated Vital Signs BP (!) 143/79 (BP Location: Right Arm)    Pulse 93    Temp (!) 97.3 F (36.3 C) (Oral)    Resp 20    Ht 5\' 7"  (1.702 m)    Wt 86.1 kg    SpO2 99%    BMI 29.73 kg/m  Physical Exam Vitals and nursing note reviewed.  Constitutional:      Appearance: He is well-developed.  HENT:     Head: Normocephalic and atraumatic.     Mouth/Throat:     Mouth: Mucous membranes are moist.     Pharynx: Oropharynx is clear.  Eyes:     Extraocular Movements: Extraocular movements intact.     Pupils: Pupils are equal, round, and reactive to light.  Cardiovascular:     Rate and Rhythm: Tachycardia present. Rhythm irregular.  Pulmonary:     Effort: Tachypnea present.     Breath sounds: Rhonchi present.  Abdominal:     General: Bowel sounds are normal.     Palpations: Abdomen is soft.  Musculoskeletal:        General: Normal range of motion.     Cervical back: Normal range of motion and neck supple.     Right lower leg: Edema present.     Left lower leg: Edema present.  Skin:    General: Skin is warm.     Capillary Refill: Capillary refill takes less than 2 seconds.  Neurological:     General: No focal deficit present.     Mental Status: He is alert and oriented to person, place, and time.  Psychiatric:        Mood and Affect: Mood normal.        Behavior: Behavior normal.    ED Results / Procedures / Treatments   Labs (all labs ordered are listed, but only abnormal results are displayed) Labs Reviewed  BASIC METABOLIC PANEL - Abnormal; Notable for the following components:      Result Value   CO2 20 (*)    Glucose,  Bld 234 (*)    BUN 52 (*)    Creatinine, Ser 1.76 (*)    Calcium 7.7 (*)    GFR, Estimated  34 (*)    All other components within normal limits  CBC - Abnormal; Notable for the following components:   WBC 14.0 (*)    RBC 2.98 (*)    Hemoglobin 9.4 (*)    HCT 29.5 (*)    Platelets 90 (*)    All other components within normal limits  BASIC METABOLIC PANEL - Abnormal; Notable for the following components:   Sodium 134 (*)    CO2 20 (*)    Glucose, Bld 268 (*)    BUN 54 (*)    Creatinine, Ser 1.66 (*)    Calcium 7.6 (*)    GFR, Estimated 37 (*)    All other components within normal limits  CBC - Abnormal; Notable for the following components:   WBC 10.9 (*)    RBC 2.80 (*)    Hemoglobin 9.1 (*)    HCT 26.8 (*)    Platelets 88 (*)    All other components within normal limits  GLUCOSE, CAPILLARY - Abnormal; Notable for the following components:   Glucose-Capillary 345 (*)    All other components within normal limits  TROPONIN I (HIGH SENSITIVITY) - Abnormal; Notable for the following components:   Troponin I (High Sensitivity) 164 (*)    All other components within normal limits  TROPONIN I (HIGH SENSITIVITY) - Abnormal; Notable for the following components:   Troponin I (High Sensitivity) 429 (*)    All other components within normal limits  TROPONIN I (HIGH SENSITIVITY) - Abnormal; Notable for the following components:   Troponin I (High Sensitivity) 369 (*)    All other components within normal limits  RESP PANEL BY RT-PCR (FLU A&B, COVID) ARPGX2  MAGNESIUM  TSH  PROTIME-INR    EKG EKG Interpretation  Date/Time:  Monday May 15 2021 15:41:54 EST Ventricular Rate:  114 PR Interval:    QRS Duration: 140 QT Interval:  336 QTC Calculation: 463 R Axis:   -30 Text Interpretation: Atrial fibrillation Ventricular premature complex LVH with secondary repolarization abnormality Inferior infarct, old Anterior infarct, old Confirmed by Isla Pence [phone]) on 05/15/2021  4:21:12 PM  Radiology DG Chest Port 1 View  Result Date: 05/15/2021 CLINICAL DATA:  Shortness of breath. EXAM: PORTABLE CHEST 1 VIEW COMPARISON:  September 23, 2019. FINDINGS: Stable cardiomediastinal silhouette. Sternotomy wires are noted. Increased bibasilar atelectasis or infiltrates are noted with possible small pleural effusions. Bony thorax is unremarkable. IMPRESSION: Increased bibasilar atelectasis or infiltrates are noted with possible small pleural effusions. Electronically Signed   By: Marijo Conception M.D.   On: 05/15/2021 16:38    Procedures Procedures    Medications Ordered in ED Medications  diltiazem (CARDIZEM) 1 mg/mL load via infusion 20 mg (20 mg Intravenous Bolus from Bag 05/15/21 1540)    And  diltiazem (CARDIZEM) 125 mg in dextrose 5% 125 mL (1 mg/mL) infusion (0 mg/hr Intravenous Stopped 05/15/21 2336)  midodrine (PROAMATINE) tablet 5 mg (5 mg Oral Given 05/15/21 1754)  aspirin EC tablet 81 mg (81 mg Oral Not Given 05/15/21 2330)  HYDROcodone-acetaminophen (NORCO/VICODIN) 5-325 MG per tablet 1 tablet (has no administration in time range)  colestipol (COLESTID) tablet 1 g (has no administration in time range)  escitalopram (LEXAPRO) tablet 10 mg (10 mg Oral Not Given 05/15/21 2330)  Alogliptin Benzoate TABS 25 mg (has no administration in time range)  insulin glargine-yfgn (SEMGLEE) injection 10 Units (10 Units Subcutaneous Given 05/16/21 0014)  levothyroxine (SYNTHROID) tablet 100 mcg (has no administration in time range)  predniSONE (DELTASONE)  tablet 20 mg (has no administration in time range)  lipase/protease/amylase (CREON) capsule 36,000 Units (has no administration in time range)  melatonin tablet 3 mg (3 mg Oral Not Given 05/15/21 2330)  latanoprost (XALATAN) 0.005 % ophthalmic solution 1 drop (1 drop Both Eyes Given 05/16/21 0014)  potassium chloride SA (KLOR-CON M) CR tablet 20 mEq (20 mEq Oral Not Given 05/15/21 2330)  heparin injection 5,000 Units (5,000 Units  Subcutaneous Given 05/16/21 0616)  acetaminophen (TYLENOL) tablet 650 mg (has no administration in time range)    Or  acetaminophen (TYLENOL) suppository 650 mg (has no administration in time range)  morphine (PF) 2 MG/ML injection 0.5 mg (has no administration in time range)  furosemide (LASIX) injection 40 mg (has no administration in time range)  insulin aspart (novoLOG) injection 0-9 Units (has no administration in time range)  furosemide (LASIX) injection 40 mg (40 mg Intravenous Given 05/15/21 1755)  digoxin (LANOXIN) 0.25 MG/ML injection 0.125 mg (0.125 mg Intravenous Given 05/15/21 1843)    ED Course/ Medical Decision Making/ A&P                           Medical Decision Making Amount and/or Complexity of Data Reviewed Labs: ordered. Radiology: ordered.  Risk Prescription drug management. Decision regarding hospitalization.   This patient presents to the ED for concern of sob, this involves an extensive number of treatment options, and is a complaint that carries with it a high risk of complications and morbidity.  The differential diagnosis includes chf, afib with rvr, pna   Co morbidities that complicate the patient evaluation  dm, htn, ckd, oa, cad, afib, syncope, tia, and chronic back pain   Additional history obtained:  Additional history obtained from epic chart review External records from outside source obtained and reviewed including daughter   Lab Tests:  I Ordered, and personally interpreted labs.  The pertinent results include:   Cbc is stable, bmp is stable.  Imaging Studies ordered:  I ordered imaging studies including cxr  I independently visualized and interpreted imaging which showed    IMPRESSION:  Increased bibasilar atelectasis or infiltrates are noted with  possible small pleural effusions.   I agree with the radiologist interpretation   Cardiac Monitoring:  The patient was maintained on a cardiac monitor.  I personally viewed and  interpreted the cardiac monitored which showed an underlying rhythm of: afib with rvr   Medicines ordered and prescription drug management:  I ordered medication including cardizem bolus and drip  for afib  Reevaluation of the patient after these medicines showed that the patient improved I have reviewed the patients home medicines and have made adjustments as needed    Consultations Obtained:  I requested consultation with the hospitalist (Dr. [facility]),  and discussed lab and imaging findings as well as pertinent plan - they recommend: admission   Problem List / ED Course:  Afib with rvr:  pt started on cardizem bolus and drip.  HR has improved. CHF:  pt had an additional lasix given at the SNF pta, so IV lasix pending.   Reevaluation:  After the interventions noted above, I reevaluated the patient and found that they have :improved   Social Determinants of Health:  At rehab   Dispostion:  After consideration of the diagnostic results and the patients response to treatment, I feel that the patent would benefit from admission.    CRITICAL CARE Performed by: Isla Pence   Total critical  care time: 30 minutes  Critical care time was exclusive of separately billable procedures and treating other patients.  Critical care was necessary to treat or prevent imminent or life-threatening deterioration.  Critical care was time spent personally by me on the following activities: development of treatment plan with patient and/or surrogate as well as nursing, discussions with consultants, evaluation of patient's response to treatment, examination of patient, obtaining history from patient or surrogate, ordering and performing treatments and interventions, ordering and review of laboratory studies, ordering and review of radiographic studies, pulse oximetry and re-evaluation of patient's condition.         Final Clinical Impression(s) / ED Diagnoses Final diagnoses:   Atrial fibrillation with rapid ventricular response ([place])  Acute on chronic congestive heart failure, unspecified heart failure type ([place])  Stage 3b chronic kidney disease ([place])    Rx / DC Orders ED Discharge Orders     None         Isla Pence, MD 05/16/21 [phone]

## 2021-05-15 NOTE — ED Notes (Signed)
Attempted to call report, RN unavailable. RN to call ED when available. Charge aware.

## 2021-05-15 NOTE — ED Triage Notes (Signed)
Pt BIB EMS due to [facility]. Pt is at [facility] for rehab and has been there for 9 days. Pt sating at 92 on RA> Pt has abd swelling and bilateral lower extremity swelling. Pt is axox4.

## 2021-05-15 NOTE — Progress Notes (Addendum)
HOSPITAL MEDICINE OVERNIGHT EVENT NOTE    Notified by nursing that patient's troponin is 164.  Per my discussion with nursing patient has no evidence of associated chest discomfort.  Chart reviewed, patient currently hospitalized for shortness of breath thought to be secondary to acute on chronic systolic congestive heart failure complicated by afib.  Elevated troponin thought to be secondary to supply/demand mismatch in the setting of acute congestive heart failure and atrial fibrillation.  Obtaining serial troponin, continue to monitor on telemetry.  Continuing to monitor for episodes of chest discomfort.  Vernelle Emerald  MD [facility]   ADDENDUM (2/28 12:30am)  Troponin revealing continue to rise at 429.  Per my discussion with nursing patient continues to be chest pain-free.  Atrial fibrillation rate is now controlled.  I anticipate that troponin will plateau now the atrial fibrillation is controlled.  We will obtain repeat EKG and continue to trend troponin to peak.  Sherryll Burger Galya Dunnigan

## 2021-05-15 NOTE — H&P (Signed)
TRH H&P   Patient Demographics:    Troy Hicks, is a 88 y.o. male  MRN: [id number]   DOB - 1919-06-10  Admit Date - 05/15/2021  Outpatient Primary MD for the patient is [name], FNP  Referring MD/NP/PA: Dr Gilford Raid  Outpatient Specialists: Cardiology Dr. Debara Pickett signed off last year  as end-stage CHF  Patient coming from: Subacute rehab/[facility]  Chief Complaint  Patient presents with   Shortness of Breath      HPI:    Troy Hicks  is a 88 y.o. male, medical history significant of diabetes, high thrombocytopenia, hyperlipidemia, peripheral vascular disease, atrial fibrillation, depression, hypothyroidism, CHF, gout, glaucoma, hypertension, dysphagia, dementia, CKD 3 who presented with complaints of shortness of breath. -Patient recently hospitalized due to chronic lower back pain, where he was discharged on prednisone. -Patient presents to ED secondary to complaints of shortness of breath, he is with dyspnea at baseline, but reported has been significant problem over the last 24 hours, patient reports orthopnea, even currently dyspnea at rest without laying supine, as well reports worsening lower extremity edema, he denies any chest pain, fever, chills, cough, nausea or vomiting, patient with known history of CHF, compliant with his Lasix, he is on 80 mg oral daily, his dose was doubled yesterday where he received 80x2, as well he received 80x2 today without much relief, so patient was sent to ED for further evaluation, with known history of end-stage CHF, with EF less than 20%, daughter at bedside report father has been end-stage CHF where he has been following with palliative for last 2 years, and cardiology has signed off. -in ED exam significant for volume overload, JVD, and volume overload on imaging, he was in A-fib with RVR heart rate in the 140s where he required Cardizem  drip, [facility] hospitalist consulted to admit.    Review of systems:    In addition to the HPI above,   A full 10 point Review of Systems was done, except as stated above, all other Review of Systems were negative.   With Past History of the following :    Past Medical History:  Diagnosis Date   Chronic kidney disease (CKD), stage III (moderate) ([place])    Per New Patient Packet,[facility]    Coronary artery disease    Per New Patient Packet,PSC    Edema    Per New Patient Packet,PSC    Gait instability    Per New Patient Packet,PSC    Gout    Per [facility] [place]    Hypertension    Per New Patient Packet,PSC    Insulin dependent diabetes mellitus    Per New Patient Packet,PSC    Osteoarthritis    Per New Patient [place]    Syncope 2015   Per New Patient Packet,PSC    TIA (transient ischemic attack) 2010   Per New Patient [place]  Past Surgical History:  Procedure Laterality Date   CORONARY ARTERY BYPASS GRAFT  1990   Per New Patient Packet,PSC    HEMORRHOID SURGERY     over 25 to 40 years ago   IVC FILTER INSERTION     Per New Patient [place] CYST EXCISION  1944   Per [facility] [place]    SKIN SURGERY  06/2018   Spot removed from head   TONSILLECTOMY     Per New Patient [place] (childhood)       Social History:     Social History   Tobacco Use   Smoking status: Former    Packs/day: 0.50    Years: 15.00    Pack years: 7.50    Types: Cigarettes    Quit date: 03/19/1957    Years since quitting: 64.2   Smokeless tobacco: Never   Tobacco comments:    60 years ago as of 2019   Substance Use Topics   Alcohol use: Yes    Comment: 1 drink per day        Family History :     Family History  Problem Relation Age of Onset   Diabetes Mother    Heart disease Mother    Throat cancer Father    Cirrhosis Son    Alcoholism Son    Ovarian cancer Daughter    Depression Daughter    Alcoholism Daughter    Colon cancer Neg  Hx       Home Medications:   Prior to Admission medications   Medication Sig Start Date End Date Taking? Authorizing Provider  acetaminophen (TYLENOL) 500 MG tablet Take 500 mg by mouth 3 (three) times daily.     [provider]  Alcohol Swabs (ALCOHOL WIPES) 70 % PADS USE FOR INSULIN INJECTIONS OR TO FINGER STICK AS DIRECTED 03/29/20   [provider]  Alogliptin Benzoate 25 MG TABS Take 25 mg by mouth daily.     [provider]  aspirin EC 81 MG tablet Take 81 mg by mouth daily.    [provider]  colestipol (COLESTID) 1 g tablet Take 1 g by mouth every other day.     [provider]  Dextran 70-Hypromellose, PF, 0.1-0.3 % SOLN Place 1 drop into both eyes 3 (three) times daily.    [provider]  escitalopram (LEXAPRO) 10 MG tablet Take 10 mg by mouth daily.    [provider]  furosemide (LASIX) 80 MG tablet Take 1 tablet (80 mg total) by mouth daily. 04/27/21   Swayze, Ava, DO  HYDROcodone-acetaminophen (NORCO/VICODIN) 5-325 MG tablet Take 1 tablet by mouth 3 (three) times daily as needed for moderate pain. 04/27/21   Swayze, Ava, DO  insulin glargine (LANTUS) 100 UNIT/ML injection Inject 0.2 mLs (20 Units total) into the skin at bedtime. 04/27/21   Swayze, Ava, DO  latanoprost (XALATAN) 0.005 % ophthalmic solution Place 1 drop into both eyes at bedtime. 03/16/20   [provider]  levothyroxine (SYNTHROID, LEVOTHROID) 100 MCG tablet Take 100 mcg by mouth daily before breakfast.    [provider]  lidocaine (LIDODERM) 5 % Place 1 patch onto the skin daily. Remove & Discard patch within 12 hours or as directed by MD 04/27/21   Swayze, Ava, DO  lipase/protease/amylase (CREON) 36000 UNITS CPEP capsule See admin instructions.    [provider]  loperamide (IMODIUM) 2 MG capsule Take 2 mg by mouth as needed for diarrhea or loose stools.  Do not exceed 6 doses in 24 hours. Patient not taking: Reported on 04/19/2021     [provider]  melatonin 3 MG TABS tablet Take 1 tablet (3 mg total) by mouth at bedtime. 04/27/21   Swayze, Ava, DO  Multiple Vitamins-Minerals (PRESERVISION AREDS 2) CAPS See admin instructions.    [provider]  polyethylene glycol (MIRALAX / GLYCOLAX) 17 g packet Take 17 g by mouth daily.    [provider]  Potassium Chloride Crys ER (KLOR-CON M20 PO) Take 20 mEq by mouth daily.    [provider]  predniSONE (DELTASONE) 20 MG tablet Take 3 tablets (60 mg total) by mouth daily with breakfast for 3 days, THEN 2.5 tablets (50 mg total) daily with breakfast for 5 days, THEN 2 tablets (40 mg total) daily with breakfast for 5 days, THEN 1.5 tablets (30 mg total) daily with breakfast for 5 days, THEN 1 tablet (20 mg total) daily with breakfast for 5 days, THEN 0.5 tablets (10 mg total) daily with breakfast for 5 days. 04/28/21 05/26/21  Swayze, Ava, DO  senna (SENOKOT) 8.6 MG TABS tablet 2 tablets at bedtime as needed    [provider]     Allergies:     Allergies  Allergen Reactions   Codeine    Other Rash     Physical Exam:   Vitals  Blood pressure 126/67, pulse 75, temperature 97.9 F (36.6 C), temperature source Oral, resp. rate (!) 28, SpO2 99 %.   1. General frail, chronically ill-appearing male, laying in bed in mild discomfort and dyspnea  2.  Demented, but pleasant, answering some questions appropriately, but impaired cognition and insight, awake alert x2   3. No F.N deficits, ALL C.Nerves Intact, Strength 5/5 all 4 extremities, Sensation intact all 4 extremities, Plantars down going.  4. Ears and Eyes appear Normal, Conjunctivae clear, PERRLA. Moist Oral Mucosa.  5. Supple Neck, No JVD, No cervical lymphadenopathy appriciated, No Carotid Bruits.  +3 edema lower extremity  6. Symmetrical Chest wall movement, Neck, some use of accessory muscles, bibasilar Rales  7.  Irregular irregular, No Gallops, Rubs or Murmurs, No  Parasternal Heave.  8. Positive Bowel Sounds, Abdomen Soft, No tenderness, No organomegaly appriciated,No rebound -guarding or rigidity.  9.  No Cyanosis, Normal Skin Turgor, No Skin Rash or Bruise.  10. Good muscle tone,  joints appear normal , no effusions, Normal ROM.  11. No Palpable Lymph Nodes in Neck or Axillae    Data Review:    CBC Recent Labs  Lab 05/15/21 1452  WBC 14.0*  HGB 9.4*  HCT 29.5*  PLT 90*  MCV 99.0  MCH 31.5  MCHC 31.9  RDW 14.6   ------------------------------------------------------------------------------------------------------------------  Chemistries  Recent Labs  Lab 05/15/21 1452  NA 135  K 5.1  CL 103  CO2 20*  GLUCOSE 234*  BUN 52*  CREATININE 1.76*  CALCIUM 7.7*  MG 2.0   ------------------------------------------------------------------------------------------------------------------ CrCl cannot be calculated (Unknown ideal weight.). ------------------------------------------------------------------------------------------------------------------ Recent Labs    05/15/21 1452  TSH 1.227    Coagulation profile Recent Labs  Lab 05/15/21 1452  INR 1.1   ------------------------------------------------------------------------------------------------------------------- No results for input(s): DDIMER in the last 72 hours. -------------------------------------------------------------------------------------------------------------------  Cardiac Enzymes No results for input(s): CKMB, TROPONINI, MYOGLOBIN in the last 168 hours.  Invalid input(s): CK ------------------------------------------------------------------------------------------------------------------    Component Value Date/Time   BNP 230.4 (H) 09/23/2019 0047     ---------------------------------------------------------------------------------------------------------------  Urinalysis    Component Value Date/Time   COLORURINE  YELLOW 04/18/2021 [place] 04/18/2021 1628   LABSPEC 1.010 04/18/2021 1628   PHURINE 6.0 04/18/2021 1628   GLUCOSEU NEGATIVE 04/18/2021 1628   HGBUR NEGATIVE 04/18/2021 1628   BILIRUBINUR NEGATIVE 04/18/2021 1628   KETONESUR NEGATIVE 04/18/2021 1628   PROTEINUR NEGATIVE 04/18/2021 1628   NITRITE NEGATIVE 04/18/2021 1628   LEUKOCYTESUR NEGATIVE 04/18/2021 1628    ----------------------------------------------------------------------------------------------------------------   Imaging Results:    DG Chest Port 1 View  Result Date: 05/15/2021 CLINICAL DATA:  Shortness of breath. EXAM: PORTABLE CHEST 1 VIEW COMPARISON:  September 23, 2019. FINDINGS: Stable cardiomediastinal silhouette. Sternotomy wires are noted. Increased bibasilar atelectasis or infiltrates are noted with possible small pleural effusions. Bony thorax is unremarkable. IMPRESSION: Increased bibasilar atelectasis or infiltrates are noted with possible small pleural effusions. Electronically Signed   By: Marijo Conception M.D.   On: 05/15/2021 16:38    My personal review of EKG: Rhythm A Fib with RVR, Rate  114 /min, QTc 463   Assessment & Plan:    Active Problems:   Ischemic cardiomyopathy   Acute CHF (congestive heart failure) (HCC)   Chronic atrial fibrillation with RVR (HCC)   Shortness of breath   CKD (chronic kidney disease) stage 4, GFR 15-29 ml/min (HCC)   Dementia (HCC)  Acute on chronic systemic CHF/ischemic cardiomyopathy -Patient presents with evidence of volume overload, +3 edema all the way to the abdomen, bibasilar Rales, volume overload on imaging and elevated BNP and JVD -This is certainly worsened by A-fib with RVR - Echocardiogram 08/25/2019 showed EF less than 20, G1 DD, trivial mitral valve regurgitation, and mild aortic dilation. -Patient admitted under CHF pathway, continue with daily weights, strict ins and out, will be started on Lasix 40 mg IV twice daily, may need to uptitrate dose for more symptomatic  relief. -I have started patient on midodrine to allow more room in blood pressure for further diuresis, as well I given 1 dose of digoxin for better heart rate control hoping to minimize Cardizem and to allow for more diuresis. -Patient end-stage CHF, cardiology has signed off on him as an outpatient and he is under palliative as an outpatient, please see discussion below under goals of care   A-fib with RVR -Heart rate uncontrolled in the 140s on presentation, he is currently on Cardizem drip for heart rate control, will give 1 dose digoxin -Not a candidate for anticoagulation given his thrombocytopenia, age, frailty and fall   Dyspnea -Patient quite symptomatic with dyspnea, causing him anxiety and great amount of concern. 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   History of depression -Continue with Lexapro and trazodone Hypothyroidism-continue with Synthroid  Hyperlipidemia- (present on admission) Continue Colestid  Diabetes mellitus, type II, insulin-dependent -Well-controlled with A1c 6.6 during recent admission, will resume Lantus at a lower dose, home dose 20 units, will start on Semglee 10 units here and will add insulin sliding scale   Dementia  Stable.    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    DVT Prophylaxis Heparin   AM Labs Ordered, also please review Full Orders  Family Communication: Admission, patients condition and plan of care including tests being ordered have been discussed with the patient and daughter at bedside who indicate understanding and agree with the plan and Code Status.  Code Status Full  Likely DC to  Pending  Condition GUARDED    Consults called: palliative requested    Admission status: inpatient    Time spent in minutes : 75 minutes   Phillips Climes M.D on 05/15/2021 at 6:24 PM   [facility] - Office  [phone]

## 2021-05-15 NOTE — Progress Notes (Signed)
Pt arrived to unit from ED. Paged Dr. Cyd Silence about pt CBG 345. For tonight pt is scheduled to get Semglee 10 units. MD placed orders for SSI on pt as well review MAR for details. Will monitor pt CBG as needed.

## 2021-05-16 ENCOUNTER — Encounter (HOSPITAL_COMMUNITY): Payer: Self-pay | Admitting: Internal Medicine

## 2021-05-16 DIAGNOSIS — N184 Chronic kidney disease, stage 4 (severe): Secondary | ICD-10-CM | POA: Diagnosis not present

## 2021-05-16 DIAGNOSIS — Z66 Do not resuscitate: Secondary | ICD-10-CM

## 2021-05-16 DIAGNOSIS — Z515 Encounter for palliative care: Secondary | ICD-10-CM

## 2021-05-16 DIAGNOSIS — I509 Heart failure, unspecified: Secondary | ICD-10-CM | POA: Diagnosis not present

## 2021-05-16 DIAGNOSIS — I255 Ischemic cardiomyopathy: Secondary | ICD-10-CM

## 2021-05-16 DIAGNOSIS — Z7189 Other specified counseling: Secondary | ICD-10-CM

## 2021-05-16 DIAGNOSIS — I4891 Unspecified atrial fibrillation: Secondary | ICD-10-CM | POA: Diagnosis not present

## 2021-05-16 DIAGNOSIS — R0600 Dyspnea, unspecified: Secondary | ICD-10-CM

## 2021-05-16 DIAGNOSIS — I5021 Acute systolic (congestive) heart failure: Secondary | ICD-10-CM

## 2021-05-16 LAB — GLUCOSE, CAPILLARY
Glucose-Capillary: 103 mg/dL — ABNORMAL HIGH (ref 70–99)
Glucose-Capillary: 111 mg/dL — ABNORMAL HIGH (ref 70–99)
Glucose-Capillary: 176 mg/dL — ABNORMAL HIGH (ref 70–99)
Glucose-Capillary: 325 mg/dL — ABNORMAL HIGH (ref 70–99)

## 2021-05-16 LAB — CBC
HCT: 26.8 % — ABNORMAL LOW (ref 39.0–52.0)
Hemoglobin: 9.1 g/dL — ABNORMAL LOW (ref 13.0–17.0)
MCH: 32.5 pg (ref 26.0–34.0)
MCHC: 34 g/dL (ref 30.0–36.0)
MCV: 95.7 fL (ref 80.0–100.0)
Platelets: 88 10*3/uL — ABNORMAL LOW (ref 150–400)
RBC: 2.8 MIL/uL — ABNORMAL LOW (ref 4.22–5.81)
RDW: 14.4 % (ref 11.5–15.5)
WBC: 10.9 10*3/uL — ABNORMAL HIGH (ref 4.0–10.5)
nRBC: 0 % (ref 0.0–0.2)

## 2021-05-16 LAB — BASIC METABOLIC PANEL
Anion gap: 9 (ref 5–15)
BUN: 54 mg/dL — ABNORMAL HIGH (ref 8–23)
CO2: 20 mmol/L — ABNORMAL LOW (ref 22–32)
Calcium: 7.6 mg/dL — ABNORMAL LOW (ref 8.9–10.3)
Chloride: 105 mmol/L (ref 98–111)
Creatinine, Ser: 1.66 mg/dL — ABNORMAL HIGH (ref 0.61–1.24)
GFR, Estimated: 37 mL/min — ABNORMAL LOW (ref >60)
Glucose, Bld: 268 mg/dL — ABNORMAL HIGH (ref 70–99)
Potassium: 4.7 mmol/L (ref 3.5–5.1)
Sodium: 134 mmol/L — ABNORMAL LOW (ref 135–145)

## 2021-05-16 LAB — TROPONIN I (HIGH SENSITIVITY): Troponin I (High Sensitivity): 369 ng/L (ref <18)

## 2021-05-16 MED ORDER — ACETAMINOPHEN 500 MG PO TABS
500.0000 mg | ORAL_TABLET | Freq: Three times a day (TID) | ORAL | Status: DC
Start: 2021-05-16 — End: 2021-05-20
  Administered 2021-05-16 – 2021-05-19 (×11): 500 mg via ORAL
  Filled 2021-05-16 (×11): qty 1

## 2021-05-16 MED ORDER — FUROSEMIDE 10 MG/ML IJ SOLN
40.0000 mg | Freq: Three times a day (TID) | INTRAMUSCULAR | Status: DC
Start: 2021-05-16 — End: 2021-05-19
  Administered 2021-05-16 – 2021-05-19 (×8): 40 mg via INTRAVENOUS
  Filled 2021-05-16 (×8): qty 4

## 2021-05-16 MED ORDER — METOLAZONE 5 MG PO TABS
2.5000 mg | ORAL_TABLET | Freq: Once | ORAL | Status: AC
  Administered 2021-05-16: 2.5 mg via ORAL
  Filled 2021-05-16: qty 1

## 2021-05-16 MED ORDER — METOPROLOL TARTRATE 25 MG PO TABS
25.0000 mg | ORAL_TABLET | Freq: Two times a day (BID) | ORAL | Status: DC
  Administered 2021-05-16 – 2021-05-19 (×8): 25 mg via ORAL
  Filled 2021-05-16 (×8): qty 1

## 2021-05-16 MED ORDER — FUROSEMIDE 10 MG/ML IJ SOLN: 40.0000 mg | Freq: Once | INTRAMUSCULAR | Status: DC

## 2021-05-16 MED ORDER — TRAZODONE HCL 50 MG PO TABS
25.0000 mg | ORAL_TABLET | Freq: Every day | ORAL | Status: DC
  Administered 2021-05-16 – 2021-05-19 (×4): 25 mg via ORAL
  Filled 2021-05-16 (×4): qty 1

## 2021-05-16 MED ORDER — PANCRELIPASE (LIP-PROT-AMYL) 12000-38000 UNITS PO CPEP
36000.0000 [IU] | ORAL_CAPSULE | Freq: Three times a day (TID) | ORAL | Status: DC
  Administered 2021-05-16 – 2021-05-17 (×4): 36000 [IU] via ORAL
  Filled 2021-05-16 (×4): qty 3

## 2021-05-16 NOTE — Plan of Care (Signed)
°  Problem: Cardiac: Goal: Ability to achieve and maintain adequate cardiopulmonary perfusion will improve Outcome: Progressing   Problem: Activity: Goal: Ability to tolerate increased activity will improve Outcome: Progressing

## 2021-05-16 NOTE — Consult Note (Addendum)
Consultation Note Date: 05/16/2021   Patient Name: Troy Hicks  DOB: 02/22/1920  MRN: [id number]  Age / Sex: 88 y.o., male  PCP: [name], FNP Referring Physician: Albertine Patricia, MD  Reason for Consultation: Establishing goals of care, symptom management  HPI/Patient Profile: 88 y.o. male  with past medical history of end-stage CHF (EF less than 20%), atrial fibrillation, peripheral vascular disease, dementia, CKD stage III, diabetes type 2, high thrombocytopenia, dysphagia, gout, glaucoma, hypothyroidism who presented to the emergency department on 05/15/2021 with shortness of breath. He has dyspnea at baseline, but reported progression over the past 24 hours. He also reported orthopnea, dyspnea at rest even while sitting up, as well as worsening lower extremity edema. In the ED found to have significant volume overload on imaging. Also found to be in afib with RVR requiring cardizem infusion. Admitted to [facility] with acute on chronic CHF secondary to ischemic cardiomyopathy. Of note, cardiology has previously signed off.  Clinical Assessment and Goals of Care: I have reviewed medical records including EPIC notes, labs and imaging, and went to see patient at bedside. He is alert and oriented. Very hard of hearing. He tells me that his breathing is "improved". He shares that he becomes very panicked when he is unable to breathe.   [name] is able to share his life review with me. He was born in [place], right across from [facility]. He served in [employer] during [employer]; was in combat at [place]. He was actually 1 of 6 brothers who all were in active combat during [employer]; all 6 brothers survived and were able to return home after the war. [name] later married and they had 4 children together; however 2 children are deceased. Living children are daughter [name] and another daughter who lives in [place] but  is not involved. [name] is widowed; he wife passed away 4 years ago and he expresses missing her very much.   I later spoke with his daughter Lucia Estelle" by phone to discuss diagnosis, prognosis, GOC, EOL wishes, disposition, and options. I introduced Palliative Medicine as specialized medical care for people living with serious illness. It focuses on providing relief from the symptoms and stress of a serious illness.   [name] shares that her father has lived at [facility] since July 2020. He was hospitalized 04/18/21 - 04/27/21 with intractable back pain. He was started on steroids with improvement in his back pain. He discharged to [facility] for rehab. As far as functional status, he has had difficulty walking for the past 6 months, and for the past 3 months has been mostly in the bed.    We discussed his current illness and what it means in the larger context of his ongoing co-morbidities. Reviewed that patient has multiple medical problems including advanced heart failure and chronic kidney disease.  Provided education on the natural trajectory of chronic illness, emphasizing it is non-curable and progressive. Discussed that chronic illness results in decreased functional status over time, as patients do  not return to previous baseline after having an illness or exacerbation.   The difference between full scope medical intervention and comfort care was considered.  I introduced the concept of a comfort path, emphasizing that this path involves de-escalating and stopping full scope medical interventions, allowing a natural course to occur. Discussed that the goal is comfort and dignity rather than cure/prolonging life.   Introduced hospice philosophy and discussed with [name] my recommendation to consider the addition of hospice support at [facility]. I explained that hospice would be extra care for patient, communication with family, and assistance with symptom management and care when he further declines.  Education provided on the indications for use of opioids and benzodiazepines in management of dyspnea in end-stage heart failure. Beth understands and will consider hospice. We plan to meet tomorrow in person to discuss further.   Primary decision maker: Patient with support from his daughter [employer].    SUMMARY OF RECOMMENDATIONS   DNR/DNI as previously documented Daughter is considering addition of hospice support at SNF Follow up discussion scheduled for tomorrow at 1:30 pm  Symptom Management:  Morphine 0.1 mg every 4 hours prn for dyspnea  Prognosis:  < 6 months  Discharge Planning: To Be Determined      Primary Diagnoses: Present on Admission:  CKD (chronic kidney disease) stage 4, GFR 15-29 ml/min (HCC)  Shortness of breath  Ischemic cardiomyopathy  Dementia (HCC)   I have reviewed the medical record, interviewed the patient and family, and examined the patient. The following aspects are pertinent.  Past Medical History:  Diagnosis Date   Chronic kidney disease (CKD), stage IV    Per New Patient Packet,PSC    Coronary artery disease    Per New Patient Packet,PSC    Edema    Per New Patient Packet,PSC    Gait instability    Per New Patient Packet,PSC    Gout    Per New Patient Packet,PSC    Hypertension    Per New Patient Packet,PSC    Insulin dependent diabetes mellitus    Per New Patient Packet,PSC    Osteoarthritis    Per New Patient [place]    Syncope 2015   Per New Patient Packet,PSC    TIA (transient ischemic attack) 2010   Per New Patient [place]      Family History  Problem Relation Age of Onset   Diabetes Mother    Heart disease Mother    Throat cancer Father    Cirrhosis Son    Alcoholism Son    Ovarian cancer Daughter    Depression Daughter    Alcoholism Daughter    Colon cancer Neg Hx    Scheduled Meds:  Alogliptin Benzoate  25 mg Oral Daily   aspirin EC  81 mg Oral Daily   colestipol  1 g Oral QODAY   escitalopram   10 mg Oral Daily   furosemide  40 mg Intravenous BID   heparin  5,000 Units Subcutaneous Q8H   insulin aspart  0-9 Units Subcutaneous TID AC & HS   insulin glargine-yfgn  10 Units Subcutaneous QHS   latanoprost  1 drop Both Eyes QHS   levothyroxine  100 mcg Oral QAC breakfast   lipase/protease/amylase  36,000 Units Oral TID AC   midodrine  5 mg Oral TID WC   potassium chloride SA  20 mEq Oral Daily   predniSONE  20 mg Oral Q breakfast   traZODone  25 mg Oral QHS   Continuous Infusions:  diltiazem (CARDIZEM) infusion Stopped (05/15/21 2336)   PRN Meds:.acetaminophen **OR** acetaminophen, HYDROcodone-acetaminophen, morphine injection   Allergies  Allergen Reactions   Codeine Nausea Only   Metformin And Related Other (See Comments)    GI upset   Other Rash   Review of Systems  Neurological:  Positive for weakness.   Physical Exam Vitals reviewed.  Constitutional:      General: He is not in acute distress.    Comments: Chronically ill-appearing  HENT:     Right Ear: Decreased hearing noted.     Left Ear: Decreased hearing noted.  Cardiovascular:     Rate and Rhythm: Normal rate.  Pulmonary:     Effort: Pulmonary effort is normal.  Neurological:     Mental Status: He is alert and oriented to person, place, and time.  Psychiatric:        Behavior: Behavior normal.    Vital Signs: BP 132/81 (BP Location: Right Arm)    Pulse 93    Temp 97.9 F (36.6 C) (Oral)    Resp 18    Ht 5\' 7"  (1.702 m)    Wt 86.1 kg    SpO2 99%    BMI 29.73 kg/m  Pain Scale: 0-10   Pain Score: 0-No pain   SpO2: SpO2: 99 % O2 Device:SpO2: 99 % O2 Flow Rate: .O2 Flow Rate (L/min): 2 L/min   LBM: Last BM Date : 05/15/21 Baseline Weight: Weight: 85.9 kg Most recent weight: Weight: 86.1 kg      Palliative Assessment/Data: PPS 30-40%    MDM - High  Signed by: Lavena Bullion, NP   Please contact Palliative Medicine Team phone at [phone] for questions and concerns.  For individual  provider: See Shea Evans

## 2021-05-16 NOTE — Progress Notes (Signed)
PROGRESS NOTE    Troy Hicks  EQA:[id number] DOB: January 22, 1920 DOA: 05/15/2021 PCP: [name], FNP    Chief Complaint  Patient presents with   Shortness of Breath    Brief Narrative:     Troy Hicks  is a 88 y.o. male, medical history significant of diabetes, high thrombocytopenia, hyperlipidemia, peripheral vascular disease, atrial fibrillation, depression, hypothyroidism, CHF, gout, glaucoma, hypertension, dysphagia, dementia, CKD 3 who presented with complaints of shortness of breath. -Patient recently hospitalized due to chronic lower back pain, where he was discharged on prednisone. -Patient presents to ED secondary to complaints of shortness of breath, where he was noted to be in A-fib with RVR and in volume overload, he was admitted for further management.    Assessment & Plan:   Principal Problem:   Acute CHF (congestive heart failure) (HCC) Active Problems:   Ischemic cardiomyopathy   Chronic atrial fibrillation with RVR (HCC)   Shortness of breath   CKD (chronic kidney disease) stage 4, GFR 15-29 ml/min (HCC)   Dementia (HCC)   Acute on chronic systemic CHF/ischemic cardiomyopathy -Patient presents with evidence of volume overload, +3 edema all the way to the abdomen, bibasilar Rales, volume overload on imaging and elevated BNP and JVD - worsened by A-fib with RVR - Echocardiogram 08/25/2019 showed EF less than 20, G1 DD, trivial mitral valve regurgitation, and mild aortic dilation.  No need to repeat as it would not alter management -Daily weight, strict ins and out, he is on Lasix 40 mg IV twice daily, I will increase to 3 times daily from tomorrow, will give 1 dose of 2.5 mg oral metolazone today -Patient end-stage CHF, cardiology has signed off on him as an outpatient and he is under palliative as an outpatient, please see discussion below under goals of care     A-fib with RVR -Heart rate uncontrolled in the 140s on presentation, required Cardizem drip which  has been discontinued overnight -Received 1 dose of digoxin initially. -Started on low-dose metoprolol -Not a candidate for anticoagulation given his thrombocytopenia, age, frailty and fall   Elevated  troponins -Due to above, in the setting of acute CHF, A-fib with RVR, and CKD -He denies any chest pain - 164>>[phone]>>[phone]   Dyspnea -Patient quite symptomatic with dyspnea, causing him anxiety and great amount of concern. 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  He will need to continue his prolonged taper on discharge -Change Tylenol to scheduled 500 mg 3 times daily, continue with as needed Vicodin as well.   History of depression -Continue with Lexapro and trazodone  Hypothyroidism -continue with Synthroid   Hyperlipidemia- (present on admission) Continue Colestid   Diabetes mellitus, type II, insulin-dependent -Well-controlled with A1c 6.6 during recent admission. -CBG acceptable on current dose Semglee of 10 units, continue with insulin sliding scale.   Dementia  Stable.     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- (present on admission) -Continue with aspirin   Thrombocytopenia  -Chronic, at baseline   Goals of care -Palliative medicine input greatly appreciated, overall continue with conservative management, no heroics, and likely plan for discharge  with hospice.    DVT prophylaxis: Heparin Code Status: DNR Family Communication: daughter at bedside Disposition:   Status is: Inpatient Remains inpatient  appropriate because: IV diuresis      Consultants:  Palliative medicine  Subjective: -She denies any chest pain, he had some insomnia overnight, dyspnea at baseline.  Objective: Vitals:   05/16/21 0133 05/16/21 0610 05/16/21 0747 05/16/21 1120  BP: 130/79 (!) 143/79 132/81 129/85  Pulse: 75 93    Resp:   18 18  Temp: 98.1 F (36.7 C) (!) 97.3 F (36.3 C) 97.9 F (36.6 C) (!) 97.1 F (36.2 C)  TempSrc: Axillary Oral Oral Oral  SpO2: 98% 99%    Weight:  86.1 kg    Height:        Intake/Output Summary (Last 24 hours) at 05/16/2021 1403 Last data filed at 05/16/2021 1238 Gross per 24 hour  Intake 369.83 ml  Output 1600 ml  Net -1230.17 ml   Filed Weights   05/15/21 2109 05/16/21 0610  Weight: 85.9 kg 86.1 kg    Examination:   Awake Alert, Oriented X 3, No new F.N deficits, Normal affect Symmetrical Chest wall movement, diminished  entry at the bases with crackles Regular,No Gallops,Rubs or new Murmurs, No Parasternal Heave +ve B.Sounds, Abd Soft, No tenderness, No rebound - guarding or rigidity. No Cyanosis, Clubbing, +3 edema     Data Reviewed: I have personally reviewed following labs and imaging studies  CBC: Recent Labs  Lab 05/15/21 1452 05/16/21 0153  WBC 14.0* 10.9*  HGB 9.4* 9.1*  HCT 29.5* 26.8*  MCV 99.0 95.7  PLT 90* 88*    Basic Metabolic Panel: Recent Labs  Lab 05/15/21 1452 05/16/21 0153  NA 135 134*  K 5.1 4.7  CL 103 105  CO2 20* 20*  GLUCOSE 234* 268*  BUN 52* 54*  CREATININE 1.76* 1.66*  CALCIUM 7.7* 7.6*  MG 2.0  --     GFR: Estimated Creatinine Clearance: 25.4 mL/min (A) (by C-G formula based on SCr of 1.66 mg/dL (H)).  Liver Function Tests: No results for input(s): AST, ALT, ALKPHOS, BILITOT, PROT, ALBUMIN in the last 168 hours.  CBG: Recent Labs  Lab 05/15/21 2122 05/16/21 0748 05/16/21 1206  GLUCAP 345* 103* 111*     Recent Results (from the past 240 hour(s))  Resp Panel by RT-PCR (Flu A&B, Covid)  Nasopharyngeal Swab     Status: None   Collection Time: 05/15/21  3:19 PM   Specimen: Nasopharyngeal Swab; Nasopharyngeal(NP) swabs in vial transport medium  Result Value Ref Range Status   SARS Coronavirus 2 by RT PCR NEGATIVE NEGATIVE Final    Comment: (NOTE) SARS-CoV-2 target nucleic acids are NOT DETECTED.  The SARS-CoV-2 RNA is generally detectable in upper respiratory specimens during the acute phase of infection. The lowest concentration of SARS-CoV-2 viral copies this assay can detect is 138 copies/mL. A negative result does not preclude SARS-Cov-2 infection and should not be used as the sole basis for treatment or other patient management decisions. A negative result may occur with  improper specimen collection/handling, submission of specimen other than nasopharyngeal swab, presence of viral mutation(s) within the areas targeted by this assay, and inadequate number of viral copies(<138 copies/mL). A negative result must be combined with clinical observations, patient history, and epidemiological information. The expected result is Negative.  Fact Sheet for Patients:  EntrepreneurPulse.com.au  Fact Sheet for Healthcare Providers:  IncredibleEmployment.be  This test is no t yet approved or cleared by the [place]  FDA and  has been authorized for detection and/or diagnosis of SARS-CoV-2 by FDA under an Emergency Use Authorization (EUA). This EUA will remain  in effect (meaning this test can be used) for the duration of the COVID-19 declaration under Section 564(b)(1) of the Act, 21 U.S.C.section 360bbb-3(b)(1), unless the authorization is terminated  or revoked sooner.       Influenza A by PCR NEGATIVE NEGATIVE Final   Influenza B by PCR NEGATIVE NEGATIVE Final    Comment: (NOTE) The Xpert Xpress SARS-CoV-2/FLU/RSV plus assay is intended as an aid in the diagnosis of influenza from Nasopharyngeal swab specimens and should not be  used as a sole basis for treatment. Nasal washings and aspirates are unacceptable for Xpert Xpress SARS-CoV-2/FLU/RSV testing.  Fact Sheet for Patients: EntrepreneurPulse.com.au  Fact Sheet for Healthcare Providers: IncredibleEmployment.be  This test is not yet approved or cleared by the [place] FDA and has been authorized for detection and/or diagnosis of SARS-CoV-2 by FDA under an Emergency Use Authorization (EUA). This EUA will remain in effect (meaning this test can be used) for the duration of the COVID-19 declaration under Section 564(b)(1) of the Act, 21 U.S.C. section 360bbb-3(b)(1), unless the authorization is terminated or revoked.  Performed at [facility], [place] [address] [place], [place] [address]          Radiology Studies: DG Chest Port 1 View  Result Date: 05/15/2021 CLINICAL DATA:  Shortness of breath. EXAM: PORTABLE CHEST 1 VIEW COMPARISON:  September 23, 2019. FINDINGS: Stable cardiomediastinal silhouette. Sternotomy wires are noted. Increased bibasilar atelectasis or infiltrates are noted with possible small pleural effusions. Bony thorax is unremarkable. IMPRESSION: Increased bibasilar atelectasis or infiltrates are noted with possible small pleural effusions. Electronically Signed   By: Marijo Conception M.D.   On: 05/15/2021 16:38        Scheduled Meds:  aspirin EC  81 mg Oral Daily   colestipol  1 g Oral QODAY   escitalopram  10 mg Oral Daily   furosemide  40 mg Intravenous BID   heparin  5,000 Units Subcutaneous Q8H   insulin aspart  0-9 Units Subcutaneous TID AC & HS   insulin glargine-yfgn  10 Units Subcutaneous QHS   latanoprost  1 drop Both Eyes QHS   levothyroxine  100 mcg Oral QAC breakfast   lipase/protease/amylase  36,000 Units Oral TID AC   midodrine  5 mg Oral TID WC   potassium chloride SA  20 mEq Oral Daily   predniSONE  20 mg Oral Q breakfast   traZODone  25 mg Oral QHS   Continuous  Infusions:  diltiazem (CARDIZEM) infusion Stopped (05/15/21 2336)     LOS: 1 day       Phillips Climes, MD [facility]   To contact the attending provider between 7A-7P or the covering provider during after hours 7P-7A, please log into the web site [email or web address] and access using universal [place] password for that web site. If you do not have the password, please call the hospital operator.  05/16/2021, 2:03 PM

## 2021-05-17 DIAGNOSIS — I4891 Unspecified atrial fibrillation: Secondary | ICD-10-CM | POA: Diagnosis not present

## 2021-05-17 DIAGNOSIS — I509 Heart failure, unspecified: Secondary | ICD-10-CM

## 2021-05-17 DIAGNOSIS — F0390 Unspecified dementia without behavioral disturbance: Secondary | ICD-10-CM | POA: Diagnosis not present

## 2021-05-17 DIAGNOSIS — I255 Ischemic cardiomyopathy: Secondary | ICD-10-CM | POA: Diagnosis not present

## 2021-05-17 DIAGNOSIS — N184 Chronic kidney disease, stage 4 (severe): Secondary | ICD-10-CM | POA: Diagnosis not present

## 2021-05-17 DIAGNOSIS — N1832 Chronic kidney disease, stage 3b: Secondary | ICD-10-CM

## 2021-05-17 LAB — CBC
HCT: 26.2 % — ABNORMAL LOW (ref 39.0–52.0)
Hemoglobin: 8.9 g/dL — ABNORMAL LOW (ref 13.0–17.0)
MCH: 32.4 pg (ref 26.0–34.0)
MCHC: 34 g/dL (ref 30.0–36.0)
MCV: 95.3 fL (ref 80.0–100.0)
Platelets: 79 10*3/uL — ABNORMAL LOW (ref 150–400)
RBC: 2.75 MIL/uL — ABNORMAL LOW (ref 4.22–5.81)
RDW: 14.2 % (ref 11.5–15.5)
WBC: 9.7 10*3/uL (ref 4.0–10.5)
nRBC: 0 % (ref 0.0–0.2)

## 2021-05-17 LAB — BASIC METABOLIC PANEL
Anion gap: 8 (ref 5–15)
BUN: 61 mg/dL — ABNORMAL HIGH (ref 8–23)
CO2: 20 mmol/L — ABNORMAL LOW (ref 22–32)
Calcium: 7.6 mg/dL — ABNORMAL LOW (ref 8.9–10.3)
Chloride: 105 mmol/L (ref 98–111)
Creatinine, Ser: 1.97 mg/dL — ABNORMAL HIGH (ref 0.61–1.24)
GFR, Estimated: 30 mL/min — ABNORMAL LOW (ref >60)
Glucose, Bld: 201 mg/dL — ABNORMAL HIGH (ref 70–99)
Potassium: 4.9 mmol/L (ref 3.5–5.1)
Sodium: 133 mmol/L — ABNORMAL LOW (ref 135–145)

## 2021-05-17 LAB — GLUCOSE, CAPILLARY
Glucose-Capillary: 121 mg/dL — ABNORMAL HIGH (ref 70–99)
Glucose-Capillary: 167 mg/dL — ABNORMAL HIGH (ref 70–99)
Glucose-Capillary: 253 mg/dL — ABNORMAL HIGH (ref 70–99)
Glucose-Capillary: 262 mg/dL — ABNORMAL HIGH (ref 70–99)

## 2021-05-17 LAB — MAGNESIUM: Magnesium: 2 mg/dL (ref 1.7–2.4)

## 2021-05-17 MED ORDER — MORPHINE SULFATE (PF) 2 MG/ML IV SOLN
1.0000 mg | Freq: Four times a day (QID) | INTRAVENOUS | Status: DC
  Administered 2021-05-17: 1 mg via INTRAVENOUS
  Filled 2021-05-17 (×2): qty 1

## 2021-05-17 MED ORDER — LORAZEPAM 0.5 MG PO TABS
0.5000 mg | ORAL_TABLET | Freq: Three times a day (TID) | ORAL | Status: DC | PRN
  Administered 2021-05-17: 0.5 mg via ORAL
  Filled 2021-05-17: qty 1

## 2021-05-17 MED ORDER — LORAZEPAM 2 MG/ML IJ SOLN
0.5000 mg | INTRAMUSCULAR | Status: DC | PRN
  Filled 2021-05-17: qty 1

## 2021-05-17 MED ORDER — ONDANSETRON 4 MG PO TBDP
4.0000 mg | ORAL_TABLET | Freq: Four times a day (QID) | ORAL | Status: DC | PRN
  Filled 2021-05-17: qty 1

## 2021-05-17 MED ORDER — ALBUMIN HUMAN 25 % IV SOLN
25.0000 g | Freq: Once | INTRAVENOUS | Status: AC
  Administered 2021-05-17: 25 g via INTRAVENOUS
  Filled 2021-05-17: qty 100

## 2021-05-17 MED ORDER — BIOTENE DRY MOUTH MT LIQD: 15.0000 mL | OROMUCOSAL | Status: DC | PRN

## 2021-05-17 MED ORDER — GLYCOPYRROLATE 0.2 MG/ML IJ SOLN: 0.2000 mg | INTRAMUSCULAR | Status: DC | PRN

## 2021-05-17 MED ORDER — HALOPERIDOL 0.5 MG PO TABS
0.5000 mg | ORAL_TABLET | ORAL | Status: DC | PRN
  Filled 2021-05-17: qty 1

## 2021-05-17 MED ORDER — ONDANSETRON HCL 4 MG/2ML IJ SOLN: 4.0000 mg | Freq: Four times a day (QID) | INTRAMUSCULAR | Status: DC | PRN

## 2021-05-17 MED ORDER — MORPHINE SULFATE (PF) 2 MG/ML IV SOLN
1.0000 mg | INTRAVENOUS | Status: DC | PRN
  Administered 2021-05-19: 2 mg via INTRAVENOUS
  Filled 2021-05-17: qty 1

## 2021-05-17 MED ORDER — GLYCOPYRROLATE 1 MG PO TABS
1.0000 mg | ORAL_TABLET | ORAL | Status: DC | PRN
  Filled 2021-05-17: qty 1

## 2021-05-17 MED ORDER — POLYVINYL ALCOHOL 1.4 % OP SOLN
1.0000 [drp] | Freq: Four times a day (QID) | OPHTHALMIC | Status: DC | PRN
  Filled 2021-05-17: qty 15

## 2021-05-17 MED ORDER — HALOPERIDOL LACTATE 2 MG/ML PO CONC
0.5000 mg | ORAL | Status: DC | PRN
  Filled 2021-05-17: qty 0.3

## 2021-05-17 MED ORDER — HALOPERIDOL LACTATE 5 MG/ML IJ SOLN: 0.5000 mg | INTRAMUSCULAR | Status: DC | PRN

## 2021-05-17 MED ORDER — LORAZEPAM 0.5 MG PO TABS
0.5000 mg | ORAL_TABLET | ORAL | Status: DC | PRN
  Administered 2021-05-18: 1 mg via ORAL
  Filled 2021-05-17: qty 2

## 2021-05-17 NOTE — Progress Notes (Signed)
? ?                                                                                                                                                     ?                                                   ?Daily Progress Note  ? ?Patient Name: Troy Hicks       Date: 05/17/2021 ?DOB: [birth year]  Age: 88 y.o. MRN#: [id number] ?Attending Physician: Eugenie Filler, MD ?Primary Care Physician: [name], FNP ?Admit Date: 05/15/2021 ? ? ?HPI/Patient Profile: 88 y.o. male  with past medical history of end-stage CHF (EF less than 20%), atrial fibrillation, peripheral vascular disease, dementia, CKD stage III, diabetes type 2, high thrombocytopenia, dysphagia, gout, glaucoma, hypothyroidism who presented to the emergency department on 05/15/2021 with shortness of breath. He has dyspnea at baseline, but reported progression over the past 24 hours. He also reported orthopnea, dyspnea at rest even while sitting up, as well as worsening lower extremity edema. In the ED found to have significant volume overload on imaging. Also found to be in afib with RVR requiring cardizem infusion. Admitted to [facility] with acute on chronic CHF secondary to ischemic cardiomyopathy. Of note, cardiology has previously signed off. ? ?Subjective: ?Patient is alert. More confused than yesterday. RN reports that prn ativan was given about 30 minutes ago for anxiety.  ? ?I met with his daughter Troy Hicks in the [place] waiting room. We reviewed his PMH and current medical condition. She reports how much he has declined over the past week. She states that he looks "worse every day".  ?She expresses concern regarding his shortness of breath, stating he has become "keenly aware" of this symptom and that it scares him.  ? ?The difference between full scope medical intervention and comfort care was considered. Reviewed the concept of a comfort path, emphasizing that this path involves de-escalating and stopping full scope medical interventions, allowing a natural  course to occur. Discussed that the goal is comfort and dignity rather than cure/prolonging life.  ? ?Discussed transitioning to comfort care while in the hospital, and what that would look like--keeping him clean and dry, no labs, no artificial hydration or feeding, no antibiotics, minimizing of medications, and medication for symptom management at. Discussed natural trajectory at EOL. Provided education on indications for opioids and benzodiazepines to manage pain, dyspnea, and anxiety at EOL.  ? ?Troy Hicks verbalizes agreement with transition to comfort care with focus on symptom management. Reviewed hospice philosophy and provided information on hospice services at SNF versus residential hospice services. In the setting of patient's recent and rapid decline, Troy Hicks  is concerned that her father will not have adequate symptom management at SNF even with the addition of hospice support. She would prefer he transition to a residential hospice facility if possible.  ? ? ?Objective: ? ?Physical Exam ?Vitals reviewed.  ?Constitutional:   ?   General: He is not in acute distress. ?   Appearance: He is ill-appearing.  ?Pulmonary:  ?   Effort: Pulmonary effort is normal.  ?Neurological:  ?   Mental Status: He is alert. He is confused.  ?Psychiatric:     ?   Mood and Affect: Mood is anxious.  ?         ? ?Vital Signs: BP 129/65   Pulse 85   Temp 97.7 ?F (36.5 ?C) (Axillary)   Resp 19   Ht 5' 7"  (1.702 m)   Wt 79.5 kg   SpO2 100%   BMI 27.45 kg/m?  ?SpO2: SpO2: 100 % ?O2 Device: O2 Device: Nasal Cannula ?O2 Flow Rate: O2 Flow Rate (L/min): 2 L/min ? ? ?LBM: Last BM Date : 05/15/21 ?Baseline Weight: Weight: 85.9 kg ?Most recent weight: Weight: 79.5 kg ? ?     ?Palliative Assessment/Data: PPS 20-30% ? ? ? ? ? ? ?Palliative Care Assessment & Plan  ? ?Assessment: ?- acute on chronic HFrEF ?- ischemic cardiomyopathy ?- atrial fibrillation with RVR ?- CKD stage 4 ?- dyspnea ?- dementia ? ?Recommendations/Plan: ?Full comfort  measures initiated ?DNR/DNI as previously documented ?TOC order placed for residential hospice facility [facility]; second choice is [facility] ?Discontinued orders that were not focused on comfort ?PRN medications are available at EOL ?PMT will continue to follow  ? ?Symptom Management:  ?Scheduled morphine 1-2 mg IV every 6 hours ?Morphine 1-2 mg IV every 3 hours PRN for breakthrough pain or dyspnea ?Lorazepam (ATIVAN) prn for anxiety ?Haloperidol (HALDOL) prn for agitation  ?Glycopyrrolate (ROBINUL) for excessive secretions ?Ondansetron (ZOFRAN) prn for nausea ?Polyvinyl alcohol (LIQUIFILM TEARS) prn for dry eyes ?Antiseptic oral rinse (BIOTENE) prn for dry mouth ? ? ?Goals of Care and Additional Recommendations: ?Limitations on Scope of Treatment: Full Comfort Care ? ?Prognosis: ? Difficult to determine;  ? ?Discharge Planning: ?To Be Determined ? ?Care plan was discussed with Dr. Grandville Silos, bedside RN, and TOC ? ?Thank you for allowing the Palliative Medicine Team to assist in the care of this patient. ? ?Total time: 67 minutes ?   ?Greater than 50%  of this time was spent counseling and coordinating care related to the above assessment and plan. ? ?Lavena Bullion, NP ? ?Please contact Palliative Medicine Team phone at [phone] for questions and concerns.  ? ? ? ? ? ?

## 2021-05-17 NOTE — Evaluation (Signed)
Physical Therapy Evaluation Patient Details Name: Troy Hicks MRN: [id number] DOB: 11/28/1919 Today's Date: 05/17/2021  History of Present Illness  Pt is a 88 y.o. male who presented 05/15/21 with SOB. Pt admitted with A-fib with RVR, volume overload, and acute on chronic systemic CHF. Of note, pt recently admitted 04/18/21 - 04/27/21 for back pain and discharged to SNF. PMH: DM, HLD, PVD, afib, depression, hypothyroidism, CHF, gout, glaucoma, HTN, dysphagia, dementia, CKD 3   Clinical Impression  Pt presents with condition above and deficits mentioned below, see PT Problem List. PTA, he was most recently getting rehab at a SNF, ambulating up to ~100 ft with assistance per pt. Prior to his hospitalization 1/31-04/27/21 he was at an ALF. Currently, pt displays deficits in balance, gross strength, and activity tolerance, placing him at risk for falls. Currently, pt is requiring modA for transfers and minA for short gait bouts up to ~40 ft distance with a RW. Pt is desiring to improve functionally and would benefit from continued rehab at a SNF upon d/c. Will continue to follow acutely.       Recommendations for follow up therapy are one component of a multi-disciplinary discharge planning process, led by the attending physician.  Recommendations may be updated based on patient status, additional functional criteria and insurance authorization.  Follow Up Recommendations Skilled nursing-short term rehab (<3 hours/day)    Assistance Recommended at Discharge Frequent or constant Supervision/Assistance  Patient can return home with the following  Assistance with cooking/housework;Assist for transportation;Help with stairs or ramp for entrance;A little help with bathing/dressing/bathroom;A lot of help with walking and/or transfers;Direct supervision/assist for medications management;Direct supervision/assist for financial management    Equipment Recommendations None recommended by PT  Recommendations for  Other Services  OT consult    Functional Status Assessment Patient has had a recent decline in their functional status and/or demonstrates limited ability to make significant improvements in function in a reasonable and predictable amount of time     Precautions / Restrictions Precautions Precautions: Fall Precaution Comments: very HOH Restrictions Weight Bearing Restrictions: No      Mobility  Bed Mobility Overal bed mobility: Needs Assistance Bed Mobility: Supine to Sit, Sit to Supine     Supine to sit: Supervision, HOB elevated Sit to supine: Supervision, HOB elevated   General bed mobility comments: Extra time and cues for hand rail use, supervision for safety.    Transfers Overall transfer level: Needs assistance Equipment used: Rolling walker (2 wheels) Transfers: Sit to/from Stand Sit to Stand: Mod assist           General transfer comment: Pt needing modA to power up to stand and extend hips coming to stand from EOB to RW. Cues for hand placement.    Ambulation/Gait Ambulation/Gait assistance: Min assist Gait Distance (Feet): 40 Feet Assistive device: Rolling walker (2 wheels) Gait Pattern/deviations: Step-through pattern, Decreased stride length, Trunk flexed, Shuffle Gait velocity: reduced Gait velocity interpretation: <1.31 ft/sec, indicative of household ambulator   General Gait Details: Pt with short, shuffling slow steps in room, pushing RW distal to body and maintaining a flexed posture. MinA for stability especially as he fatigued.  Stairs            Wheelchair Mobility    Modified Rankin (Stroke Patients Only)       Balance Overall balance assessment: Needs assistance Sitting-balance support: Feet supported Sitting balance-Leahy Scale: Fair     Standing balance support: Bilateral upper extremity supported, Reliant on assistive device for balance, During  functional activity Standing balance-Leahy Scale: Poor Standing balance  comment: Reliant on external assist and BUE supported on RW.                             Pertinent Vitals/Pain Pain Assessment Pain Assessment: Faces Faces Pain Scale: Hurts little more Pain Location: generalized grimacing with mobility Pain Descriptors / Indicators: Grimacing Pain Intervention(s): Monitored during session, Limited activity within patient's tolerance, Repositioned    Home Living Family/patient expects to be discharged to:: [place]: Conservation officer, nature (2 wheels);Hand held shower head;Grab bars - tub/shower;Grab bars - toilet;Wheelchair - manual;Shower seat - built in Additional Comments: info obtained from entry 04/20/21    Prior Function Prior Level of Function : Needs assist             Mobility Comments: about a week and a half prior to 04/20/21 was able to get up and to breakfast table in his room at ALF.  Since has been too painful to move, daughter reports progressive decline over past 6 months. Recently d/c's to SNF following hospitalization earlier February 2023, where pt reports he was ambulating up to ~100 ft with assistance. ADLs Comments: per entry 04/20/21: was getting meals delivered to his room, daughter reports needs higher level of care     Hand Dominance        Extremity/Trunk Assessment   Upper Extremity Assessment Upper Extremity Assessment: Defer to OT evaluation    Lower Extremity Assessment Lower Extremity Assessment: Generalized weakness    Cervical / Trunk Assessment Cervical / Trunk Assessment: Other exceptions Cervical / Trunk Exceptions: kyphotic  Communication   Communication: HOH  Cognition Arousal/Alertness: Awake/alert Behavior During Therapy: WFL for tasks assessed/performed Overall Cognitive Status: History of cognitive impairments - at baseline                                 General Comments: decreased STM and very HOH        General  Comments General comments (skin integrity, edema, etc.): VSS on RA but DOE noted    Exercises     Assessment/Plan    PT Assessment Patient needs continued PT services  PT Problem List Decreased strength;Decreased mobility;Decreased activity tolerance;Decreased balance;Decreased cognition;Cardiopulmonary status limiting activity       PT Treatment Interventions DME instruction;Therapeutic activities;Balance training;Functional mobility training;Patient/family education;Therapeutic exercise;Gait training;Neuromuscular re-education;Cognitive remediation    PT Goals (Current goals can be found in the Care Plan section)  Acute Rehab PT Goals Patient Stated Goal: to get stronger PT Goal Formulation: With patient Time For Goal Achievement: 05/31/21 Potential to Achieve Goals: Fair    Frequency Min 2X/week     Co-evaluation               AM-PAC PT "6 Clicks" Mobility  Outcome Measure Help needed turning from your back to your side while in a flat bed without using bedrails?: A Little Help needed moving from lying on your back to sitting on the side of a flat bed without using bedrails?: A Little Help needed moving to and from a bed to a chair (including a wheelchair)?: A Lot Help needed standing up from a chair using your arms (e.g., wheelchair or bedside chair)?: A Lot Help needed to walk in hospital room?: A Little Help  needed climbing 3-5 steps with a railing? : Total 6 Click Score: 14    End of Session Equipment Utilized During Treatment: Gait belt Activity Tolerance: Patient tolerated treatment well Patient left: in bed;with call bell/phone within reach;with bed alarm set Nurse Communication: Mobility status PT Visit Diagnosis: Muscle weakness (generalized) (M62.81);Unsteadiness on feet (R26.81);Other abnormalities of gait and mobility (R26.89);Difficulty in walking, not elsewhere classified (R26.2)    Time: 3329-5188 PT Time Calculation (min) (ACUTE ONLY): 27  min   Charges:   PT Evaluation $PT Eval Moderate Complexity: 1 Mod PT Treatments $Therapeutic Activity: 8-22 mins        Moishe Spice, PT, DPT Acute Rehabilitation Services  Pager: [phone] Office: [phone]   Orvan Falconer 05/17/2021, 12:55 PM

## 2021-05-17 NOTE — TOC Initial Note (Addendum)
Transition of Care (TOC) - Initial/Assessment Note  ? ? ?Patient Details  ?Name: Troy Hicks ?MRN: [id number] ?Date of Birth: [birth year] ? ?Transition of Care (TOC) CM/SW Contact:    ?Milas Gain, LCSWA ?Phone Number: ?05/17/2021, 3:46 PM ? ?Clinical Narrative:                 ? ? ?Update- CSW spoke with patients daughter. Patients daughter request for CSW to follow up with Gregary Signs NP with palliative on dc plan for patient. CSW to follow up with Gregary Signs with palliative team. ? ? ?Palliative to speak with patients daughter today. CSW following. CSW will continue to follow and assist with patients dc planning needs. ? ?Patient Goals and CMS Choice ?  ?  ?  ? ?Expected Discharge Plan and Services ?  ?  ?  ?  ?  ?                ?  ?  ?  ?  ?  ?  ?  ?  ?  ?  ? ?Prior Living Arrangements/Services ?  ?  ?  ?       ?  ?  ?  ?  ? ?Activities of Daily Living ?Home Assistive Devices/Equipment: Gilford Rile (specify type), Raised toilet seat with rails (front wheel) ?ADL Screening (condition at time of admission) ?Patient's cognitive ability adequate to safely complete daily activities?: Yes ?Is the patient deaf or have difficulty hearing?: Yes ?Does the patient have difficulty seeing, even when wearing glasses/contacts?: No ?Does the patient have difficulty concentrating, remembering, or making decisions?: Yes ?Patient able to express need for assistance with ADLs?: Yes ?Does the patient have difficulty dressing or bathing?: Yes ?Independently performs ADLs?: No ?Communication: Needs assistance ?Is this a change from baseline?: Pre-admission baseline ?Dressing (OT): Needs assistance ?Is this a change from baseline?: Pre-admission baseline ?Grooming: Needs assistance ?Is this a change from baseline?: Pre-admission baseline ?Feeding: Needs assistance ?Bathing: Needs assistance ?Is this a change from baseline?: Pre-admission baseline ?Toileting: Needs assistance ?Is this a change from baseline?: Pre-admission baseline ?In/Out Bed: Needs  assistance ?Is this a change from baseline?: Pre-admission baseline ?Walks in Home: Needs assistance ?Is this a change from baseline?: Pre-admission baseline ?Does the patient have difficulty walking or climbing stairs?: Yes ?Weakness of Legs: Both ?Weakness of Arms/Hands: Both ? ?Permission Sought/Granted ?  ?  ?   ?   ?   ?   ? ?Emotional Assessment ?  ?  ?  ?  ?  ?  ? ?Admission diagnosis:  Acute CHF (congestive heart failure) ([place]) [I50.9] ?Atrial fibrillation with rapid ventricular response ([place]) [I48.91] ?Chronic atrial fibrillation with RVR (HCC) [I48.20] ?Stage 3b chronic kidney disease ([place]) [N18.32] ?Acute on chronic congestive heart failure, unspecified heart failure type ([place]) [I50.9] ?Patient Active Problem List  ? Diagnosis Date Noted  ? Acute CHF (congestive heart failure) ([place]) 05/15/2021  ? Chronic atrial fibrillation with RVR ([place]) 05/15/2021  ? Chronic systolic CHF (congestive heart failure) ([place]) 04/21/2021  ? CKD (chronic kidney disease) stage 4, GFR 15-29 ml/min (HCC) 04/21/2021  ? Generalized anxiety disorder 04/19/2021  ? Recurrent major depression in remission ([place]) 04/19/2021  ? Intractable back pain 04/18/2021  ? Epidermal cyst 10/05/2020  ? Abnormal gait 06/15/2020  ? Abrasion of right hand 06/15/2020  ? Actinic keratosis 06/15/2020  ? Adjustment disorder with depressed mood 06/15/2020  ? Arthropathy of pelvic region and thigh 06/15/2020  ? 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  Bilateral pseudophakia 06/15/2020  ? Body mass index (BMI) 31.0-31.9, adult 06/15/2020  ? Cellulitis and abscess of trunk 06/15/2020  ? Chronic diarrhea of unknown origin 06/15/2020  ? Chronic diastolic heart failure ([place]) 06/15/2020  ? Chronic pain 06/15/2020  ? Dementia ([place]) 06/15/2020  ? Diabetic renal disease ([place]) 06/15/2020  ? Edema 06/15/2020  ? Esophageal dysphagia 06/15/2020  ? Foreign body in ear 06/15/2020  ? Fecal smearing 06/15/2020  ? Generalized  osteoarthritis 06/15/2020  ? Glaucoma 06/15/2020  ? Heart disease 06/15/2020  ? Peripheral vascular disease ([place]) 06/15/2020  ? History of colonic polyps 06/15/2020  ? History of endocrine disorder 06/15/2020  ? History of fall 06/15/2020  ? Hyperglycemia due to type 2 diabetes mellitus ([place]) 06/15/2020  ? Hyperlipidemia 06/15/2020  ? Hypothyroid 06/15/2020  ? Long term (current) use of insulin ([place]) 06/15/2020  ? Low back pain 06/15/2020  ? Lumbosacral spondylosis without myelopathy 06/15/2020  ? Moderate recurrent major depression ([place]) 06/15/2020  ? Morbid obesity ([place]) 06/15/2020  ? Nonexudative age-related macular degeneration 06/15/2020  ? Orthostatic hypotension 06/15/2020  ? Other ill-defined and unknown causes of morbidity and mortality 06/15/2020  ? Other psoriasis 06/15/2020  ? Paroxysmal atrial fibrillation ([place]) 06/15/2020  ? Patient's noncompliance with dietary regimen 06/15/2020  ? Gout 06/15/2020  ? Insomnia 06/15/2020  ? Psychosexual dysfunction with inhibited sexual excitement 06/15/2020  ? Rectal prolapse 06/15/2020  ? Recurrent falls 06/15/2020  ? Chronic kidney disease due to hypertension 06/15/2020  ? Type 2 diabetes mellitus with other diabetic kidney complication ([place]) 35/46/5681  ? Abdominal aortic atherosclerosis ([place]) 05/16/2020  ? Degeneration of lumbar intervertebral disc 05/16/2020  ? Essential hypertension 05/16/2020  ? Lumbar spondylosis 05/16/2020  ? Thrombocytopenia ([place]) 07/21/2019  ? Perianal abscess 05/14/2019  ? Diarrhea 05/14/2019  ? Osteoarthritis of elbow 11/26/2018  ? Osteoarthritis of glenohumeral joint 11/26/2018  ? Pain in joint of left shoulder 11/25/2018  ? Pain in joint of right shoulder 11/25/2018  ? Pain in elbow 11/25/2018  ? Shortness of breath 04/10/2018  ? Ischemic cardiomyopathy 04/10/2018  ? ?PCP:  [name], FNP ?Pharmacy:  No Pharmacies Listed ? ? ? ?Social Determinants of Health (SDOH) Interventions ?  ? ?Readmission Risk Interventions ?No flowsheet  data found. ? ? ?

## 2021-05-17 NOTE — Evaluation (Signed)
Occupational Therapy Evaluation ?Patient Details ?Name: Troy Hicks ?MRN: [id number] ?DOB: [birth year] ?Today's Date: 05/17/2021 ? ? ?History of Present Illness Pt is a 88 y.o. male who presented 05/15/21 with SOB. Pt admitted with A-fib with RVR, volume overload, and acute on chronic systemic CHF. Of note, pt recently admitted 04/18/21 - 04/27/21 for back pain and discharged to SNF. PMH: DM, HLD, PVD, afib, depression, hypothyroidism, CHF, gout, glaucoma, HTN, dysphagia, dementia, CKD 3  ? ?Clinical Impression ?  ?Chart reviewed, pt greeted in bed with noted spillage of lunch  requiring MIN A for feeding. Pt performed supine>sit with MIN A, consumed lunch with SET UP seated at EOB with improved performance. Pt requires assist for all ADL. Attempted STS to take steps up the bed to reposition with pt requiring MAX A at this time likely due to decreased endurance as pt had been seated at EOB for approx 20 minutes. Pt presents with deficits in endurance, strength, activity tolerance all affecting optimal completion of ADL. Pt reports desire to get stronger. Pt would benefit from STR to address functional deficits. Pt is left as received, NAD, all needs met. OT will continue to follow.  ?   ? ?Recommendations for follow up therapy are one component of a multi-disciplinary discharge planning process, led by the attending physician.  Recommendations may be updated based on patient status, additional functional criteria and insurance authorization.  ? ?Follow Up Recommendations ? Skilled nursing-short term rehab (<3 hours/day)  ?  ?Assistance Recommended at Discharge Frequent or constant Supervision/Assistance  ?Patient can return home with the following A lot of help with walking and/or transfers;A lot of help with bathing/dressing/bathroom;Assist for transportation;Assistance with cooking/housework ? ?  ?Functional Status Assessment ? Patient has had a recent decline in their functional status and demonstrates the ability to make  significant improvements in function in a reasonable and predictable amount of time.  ?Equipment Recommendations ? None recommended by OT  ?  ?Recommendations for Other Services   ? ? ?  ?Precautions / Restrictions Precautions ?Precautions: Fall ?Precaution Comments: HOH ?Restrictions ?Weight Bearing Restrictions: No  ? ?  ? ?Mobility Bed Mobility ?Overal bed mobility: Needs Assistance ?Bed Mobility: Supine to Sit, Sit to Supine ?  ?  ?Supine to sit: Min assist, HOB elevated ?Sit to supine: Min assist, HOB elevated ?  ?  ?  ? ?Transfers ?Overall transfer level: Needs assistance ?Equipment used: Rolling walker (2 wheels) ?Transfers: Sit to/from Stand ?Sit to Stand: Max assist ?  ?  ?  ?  ?  ?  ?  ? ?  ?Balance Overall balance assessment: Needs assistance ?  ?Sitting balance-Leahy Scale: Fair ?  ?Postural control: Left lateral lean ?  ?Standing balance-Leahy Scale: Poor ?  ?  ?  ?  ?  ?  ?  ?  ?  ?  ?  ?  ?   ? ?ADL either performed or assessed with clinical judgement  ? ?ADL Overall ADL's : Needs assistance/impaired ?Eating/Feeding: Supervision/ safety;Minimal assistance ?Eating/Feeding Details (indicate cue type and reason): MIN A in bed, transitioned to EOB with SUP ?Grooming: Set up;Sitting ?  ?Upper Body Bathing: Moderate assistance ?Upper Body Bathing Details (indicate cue type and reason): anticipated ?Lower Body Bathing: Maximal assistance ?Lower Body Bathing Details (indicate cue type and reason): anticipated ?  ?  ?Lower Body Dressing: Maximal assistance ?  ?  ?  ?  ?  ?  ?  ?  ?General ADL Comments: STS with MAX A +1, unable  to take steps up the bed  ? ? ? ?Vision Baseline Vision/History: 1 Wears glasses ?Patient Visual Report: No change from baseline ?   ?   ?Perception   ?  ?Praxis   ?  ? ?Pertinent Vitals/Pain Pain Assessment ?Pain Assessment: No/denies pain  ? ? ? ?Hand Dominance   ?  ?Extremity/Trunk Assessment Upper Extremity Assessment ?Upper Extremity Assessment: Generalized weakness ?  ?Lower  Extremity Assessment ?Lower Extremity Assessment: Defer to PT evaluation ?  ?Cervical / Trunk Assessment ?Cervical / Trunk Exceptions: kyphotic ?  ?Communication Communication ?Communication: HOH ?  ?Cognition Arousal/Alertness: Awake/alert ?Behavior During Therapy: [facility] for tasks assessed/performed ?Overall Cognitive Status: History of cognitive impairments - at baseline ?Area of Impairment: Memory, Safety/judgement, Awareness, Problem solving ?  ?  ?  ?  ?  ?  ?  ?  ?  ?  ?Memory: Decreased short-term memory ?  ?Safety/Judgement: Decreased awareness of deficits ?Awareness: Emergent ?Problem Solving: Slow processing ?  ?  ?  ?General Comments  vss throughout ? ?  ?Exercises   ?  ?Shoulder Instructions    ? ? ?Home Living Family/patient expects to be discharged to:: Skilled nursing facility ?  ?  ?  ?  ?  ?  ?  ?  ?  ?  ?  ?  ?  ?  ?Home Equipment: Conservation officer, nature (2 wheels);Hand held shower head;Grab bars - tub/shower;Grab bars - toilet;Wheelchair - manual;Shower seat - built in ?  ?Additional Comments: per chart review ?  ? ?  ?Prior Functioning/Environment Prior Level of Function : Needs assist ?  ?  ?  ?  ?  ?  ?Mobility Comments: pt was in SNF PTA amb with assistance approx 100 feet ?ADLs Comments: assist with all ADL/IADL ?  ? ?  ?  ?OT Problem List:   ?  ?   ?OT Treatment/Interventions: Self-care/ADL training;Therapeutic activities;DME and/or AE instruction;Balance training;Patient/family education  ?  ?OT Goals(Current goals can be found in the care plan section) Acute Rehab OT Goals ?Patient Stated Goal: to eat my lunch ?OT Goal Formulation: With patient ?Time For Goal Achievement: 05/31/21 ?ADL Goals ?Pt Will Perform Eating: with set-up ?Pt Will Perform Grooming: with min assist;sitting ?Pt Will Perform Upper Body Dressing: with min assist ?Pt Will Transfer to Toilet: with min guard assist  ?OT Frequency: Min 2X/week ?  ? ?Co-evaluation   ?  ?  ?  ?  ? ?  ?AM-PAC OT "6 Clicks" Daily Activity     ?Outcome  Measure Help from another person eating meals?: A Little ?Help from another person taking care of personal grooming?: A Little ?Help from another person toileting, which includes using toliet, bedpan, or urinal?: A Lot ?Help from another person bathing (including washing, rinsing, drying)?: A Lot ?Help from another person to put on and taking off regular upper body clothing?: A Little ?Help from another person to put on and taking off regular lower body clothing?: Total ?6 Click Score: 14 ?  ?End of Session Nurse Communication: Mobility status ? ?Activity Tolerance: Patient tolerated treatment well ?Patient left: in bed;with call bell/phone within reach;with bed alarm set ? ?   ?              ?Time: 5852-7782 ?OT Time Calculation (min): 31 min ?Charges:  OT General Charges ?$OT Visit: 1 Visit ?OT Evaluation ?$OT Eval Moderate Complexity: 1 Mod ? ?Shanon Payor, OTD OTR/L  ?05/17/21, 4:01 PM  ?

## 2021-05-17 NOTE — Progress Notes (Signed)
PROGRESS NOTE    Troy Hicks  ZOX:[id number] DOB: [birth year] DOA: 05/15/2021 PCP: [name], FNP    Chief Complaint  Patient presents with   Shortness of Breath    Brief Narrative:  Troy Hicks  is a 88 y.o. male, medical history significant of diabetes, high thrombocytopenia, hyperlipidemia, peripheral vascular disease, atrial fibrillation, depression, hypothyroidism, CHF, gout, glaucoma, hypertension, dysphagia, dementia, CKD 3 who presented with complaints of shortness of breath. -Patient recently hospitalized due to chronic lower back pain, where he was discharged on prednisone. -Patient presents to ED secondary to complaints of shortness of breath, where he was noted to be in A-fib with RVR and in volume overload, he was admitted for further management.   Palliative care consulted as patient with end-stage CHF.     Assessment & Plan:   Principal Problem:   Acute CHF (congestive heart failure) (HCC) Active Problems:   Shortness of breath   Ischemic cardiomyopathy   Dementia (HCC)   CKD (chronic kidney disease) stage 4, GFR 15-29 ml/min (HCC)   Chronic atrial fibrillation with RVR (HCC)   Acute on chronic congestive heart failure (HCC)   Atrial fibrillation with rapid ventricular response (HCC)   Stage 3b chronic kidney disease (HCC)  Acute on chronic systemic CHF/ischemic cardiomyopathy -Patient presented with evidence of volume overload, +3 edema all the way to the abdomen, bibasilar Rales, volume overload on imaging and elevated BNP and JVD - worsened by A-fib with RVR - Echocardiogram 08/25/2019 showed EF less than 20, G1 DD, trivial mitral valve regurgitation, and mild aortic dilation.  No need to repeat as it would not alter management. -Patient currently on IV Lasix which was increased to 40 mg 3 times daily with a urine output of 3.3 L over the past 24 hours. -Status post dose of metolazone 2.5 mg x 1 05/16/2021. -Strict I's and O's, daily weights -Pressure  borderline, patient on midodrine.  We will give a dose of IV albumin and continue current regimen of IV Lasix 3 times daily. -Patient end-stage CHF, cardiology has signed off on him as an outpatient and he is under palliative as an outpatient. -Palliative care consultation for goals of care.  We will likely need to be discharged with hospice versus residential hospice facility.      A-fib with RVR -Heart rate uncontrolled in the 140s on presentation, required Cardizem drip which has subsequently been discontinued. -Status post IV digoxin initially -Continue metoprolol for rate control.  -Not a candidate for anticoagulation given his thrombocytopenia, age, frailty and fall   Elevated  troponins -Due to above, in the setting of acute CHF, A-fib with RVR, and CKD -He denies any chest pain - 164>>[phone]>>[phone] -Palliative care pending. -Continue current conservative treatment with aspirin, IV diuretics, Lopressor.    Dyspnea -Patient noted to be symptomatic with dyspnea leading to anxiety and great amount of concern.   -Continue low-dose morphine as needed for dyspnea.   -Palliative care consultation pending.    CKD stage 4 -Avoid nephrotoxic agents.   -Renal function stable.   -Monitor with diuresis.     Chronic lower back pain/lumbosacral spondylolysis without myelopathy -During recent hospitalization, continue with as needed pain control -Patient was discharged on prolonged prednisone taper, will continue currently at 20 mg oral daily and taper gradually.  He will need to continue his prolonged taper on discharge -Continue scheduled Tylenol 500 mg 3 times daily.   -Vicodin as needed.    History of depression -Lexapro, trazodone.  Hypothyroidism -Synthroid.     Hyperlipidemia- (present on admission) Colestipol.   Diabetes mellitus, type II, insulin-dependent -Well-controlled with A1c 6.6 (04/19/2021) -CBG 121 this morning . -Continue Semglee 10 units daily, SSI.    Dementia   Stable.     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- (present on admission) -Continue aspirin.   Thrombocytopenia  -Chronic, at baseline   Goals of care -Palliative medicine input greatly appreciated, overall continue with conservative management, no heroics, and likely plan for discharge with hospice at facility versus residential hospice home..   DVT prophylaxis: Heparin Code Status: DNR Family Communication: Updated patient.  No family at bedside Disposition:   Status is: Inpatient Remains inpatient appropriate because: Severity of illness.           Consultants:  Palliative care  Procedures:  Chest x-ray 05/15/2021   Antimicrobials:  None   Subjective: Patient sitting up in bed.  Denies any chest pain.  Still with some shortness of breath however states has improved since admission.  Still with some lower extremity edema.  RN at bedside.  Objective: Vitals:   05/17/21 1024 05/17/21 1113 05/17/21 1409 05/17/21 1705  BP: 129/65 105/66  (!) 133/58  Pulse: 85 88  87  Resp:  19  16  Temp:  97.7 F (36.5 C)  98.1 F (36.7 C)  TempSrc:  Oral  Oral  SpO2:  97% 99% 99%  Weight:      Height:        Intake/Output Summary (Last 24 hours) at 05/17/2021 1709 Last data filed at 05/17/2021 1300 Gross per 24 hour  Intake 630 ml  Output 4350 ml  Net -3720 ml   Filed Weights   05/15/21 2109 05/16/21 0610 05/17/21 0516  Weight: 85.9 kg 86.1 kg 79.5 kg    Examination:  General exam: Appears calm and comfortable  Respiratory system: Clear to auscultation anterior lung fields.  No wheezes.  Fair air movement.  Speaking in full sentences.Marland Kitchen Respiratory effort normal. Cardiovascular system: S1 & S2 heard, RRR. + JVD, murmurs, rubs, gallops or clicks.  2+ bilateral lower extremity edema up to hips. Gastrointestinal system: Abdomen is nondistended, soft and nontender. No organomegaly or masses felt. Normal bowel sounds heard. Central  nervous system: Alert and oriented. No focal neurological deficits. Extremities: Symmetric 5 x 5 power. Skin: No rashes, lesions or ulcers Psychiatry: Judgement and insight appear fair. Mood & affect appropriate.     Data Reviewed: I have personally reviewed following labs and imaging studies  CBC: Recent Labs  Lab 05/15/21 1452 05/16/21 0153 05/17/21 0211  WBC 14.0* 10.9* 9.7  HGB 9.4* 9.1* 8.9*  HCT 29.5* 26.8* 26.2*  MCV 99.0 95.7 95.3  PLT 90* 88* 79*    Basic Metabolic Panel: Recent Labs  Lab 05/15/21 1452 05/16/21 0153 05/17/21 0211  NA 135 134* 133*  K 5.1 4.7 4.9  CL 103 105 105  CO2 20* 20* 20*  GLUCOSE 234* 268* 201*  BUN 52* 54* 61*  CREATININE 1.76* 1.66* 1.97*  CALCIUM 7.7* 7.6* 7.6*  MG 2.0  --  2.0    GFR: Estimated Creatinine Clearance: 20.7 mL/min (A) (by C-G formula based on SCr of 1.97 mg/dL (H)).  Liver Function Tests: No results for input(s): AST, ALT, ALKPHOS, BILITOT, PROT, ALBUMIN in the last 168 hours.  CBG: Recent Labs  Lab 05/16/21 1206 05/16/21 1555 05/16/21 2006 05/17/21 0656 05/17/21 1153  GLUCAP 111* 176* 325* 121* 167*     Recent Results (  from the past 240 hour(s))  Resp Panel by RT-PCR (Flu A&B, Covid) Nasopharyngeal Swab     Status: None   Collection Time: 05/15/21  3:19 PM   Specimen: Nasopharyngeal Swab; Nasopharyngeal(NP) swabs in vial transport medium  Result Value Ref Range Status   SARS Coronavirus 2 by RT PCR NEGATIVE NEGATIVE Final    Comment: (NOTE) SARS-CoV-2 target nucleic acids are NOT DETECTED.  The SARS-CoV-2 RNA is generally detectable in upper respiratory specimens during the acute phase of infection. The lowest concentration of SARS-CoV-2 viral copies this assay can detect is 138 copies/mL. A negative result does not preclude SARS-Cov-2 infection and should not be used as the sole basis for treatment or other patient management decisions. A negative result may occur with  improper specimen  collection/handling, submission of specimen other than nasopharyngeal swab, presence of viral mutation(s) within the areas targeted by this assay, and inadequate number of viral copies(<138 copies/mL). A negative result must be combined with clinical observations, patient history, and epidemiological information. The expected result is Negative.  Fact Sheet for Patients:  EntrepreneurPulse.com.au  Fact Sheet for Healthcare Providers:  IncredibleEmployment.be  This test is no t yet approved or cleared by the [place] FDA and  has been authorized for detection and/or diagnosis of SARS-CoV-2 by FDA under an Emergency Use Authorization (EUA). This EUA will remain  in effect (meaning this test can be used) for the duration of the COVID-19 declaration under Section 564(b)(1) of the Act, 21 U.S.C.section 360bbb-3(b)(1), unless the authorization is terminated  or revoked sooner.       Influenza A by PCR NEGATIVE NEGATIVE Final   Influenza B by PCR NEGATIVE NEGATIVE Final    Comment: (NOTE) The Xpert Xpress SARS-CoV-2/FLU/RSV plus assay is intended as an aid in the diagnosis of influenza from Nasopharyngeal swab specimens and should not be used as a sole basis for treatment. Nasal washings and aspirates are unacceptable for Xpert Xpress SARS-CoV-2/FLU/RSV testing.  Fact Sheet for Patients: EntrepreneurPulse.com.au  Fact Sheet for Healthcare Providers: IncredibleEmployment.be  This test is not yet approved or cleared by the [place] FDA and has been authorized for detection and/or diagnosis of SARS-CoV-2 by FDA under an Emergency Use Authorization (EUA). This EUA will remain in effect (meaning this test can be used) for the duration of the COVID-19 declaration under Section 564(b)(1) of the Act, 21 U.S.C. section 360bbb-3(b)(1), unless the authorization is terminated or revoked.  Performed at [facility], [place] [address] [place], [place] [address]          Radiology Studies: No results found.      Scheduled Meds:  acetaminophen  500 mg Oral TID   aspirin EC  81 mg Oral Daily   colestipol  1 g Oral QODAY   escitalopram  10 mg Oral Daily   furosemide  40 mg Intravenous TID AC   heparin  5,000 Units Subcutaneous Q8H   insulin aspart  0-9 Units Subcutaneous TID AC & HS   insulin glargine-yfgn  10 Units Subcutaneous QHS   latanoprost  1 drop Both Eyes QHS   levothyroxine  100 mcg Oral QAC breakfast   lipase/protease/amylase  36,000 Units Oral TID AC   metoprolol tartrate  25 mg Oral BID   midodrine  5 mg Oral TID WC   predniSONE  20 mg Oral Q breakfast   traZODone  25 mg Oral QHS   Continuous Infusions:  albumin human       LOS: 2 days  Time spent: 40 minutes    Irine Seal, MD [facility]   To contact the attending provider between 7A-7P or the covering provider during after hours 7P-7A, please log into the web site [email or web address] and access using universal [place] password for that web site. If you do not have the password, please call the hospital operator.  05/17/2021, 5:09 PM

## 2021-05-18 DIAGNOSIS — F419 Anxiety disorder, unspecified: Secondary | ICD-10-CM

## 2021-05-18 DIAGNOSIS — Z515 Encounter for palliative care: Secondary | ICD-10-CM

## 2021-05-18 DIAGNOSIS — R0600 Dyspnea, unspecified: Secondary | ICD-10-CM

## 2021-05-18 DIAGNOSIS — Z789 Other specified health status: Secondary | ICD-10-CM

## 2021-05-18 DIAGNOSIS — Z66 Do not resuscitate: Secondary | ICD-10-CM

## 2021-05-18 LAB — BASIC METABOLIC PANEL
Anion gap: 10 (ref 5–15)
BUN: 68 mg/dL — ABNORMAL HIGH (ref 8–23)
CO2: 27 mmol/L (ref 22–32)
Calcium: 8.2 mg/dL — ABNORMAL LOW (ref 8.9–10.3)
Chloride: 97 mmol/L — ABNORMAL LOW (ref 98–111)
Creatinine, Ser: 1.96 mg/dL — ABNORMAL HIGH (ref 0.61–1.24)
GFR, Estimated: 30 mL/min — ABNORMAL LOW (ref >60)
Glucose, Bld: 108 mg/dL — ABNORMAL HIGH (ref 70–99)
Potassium: 4.4 mmol/L (ref 3.5–5.1)
Sodium: 134 mmol/L — ABNORMAL LOW (ref 135–145)

## 2021-05-18 LAB — GLUCOSE, CAPILLARY
Glucose-Capillary: 106 mg/dL — ABNORMAL HIGH (ref 70–99)
Glucose-Capillary: 68 mg/dL — ABNORMAL LOW (ref 70–99)
Glucose-Capillary: 141 mg/dL — ABNORMAL HIGH (ref 70–99)
Glucose-Capillary: 267 mg/dL — ABNORMAL HIGH (ref 70–99)
Glucose-Capillary: 339 mg/dL — ABNORMAL HIGH (ref 70–99)

## 2021-05-18 MED ORDER — SENNA 8.6 MG PO TABS: 2.0000 | ORAL_TABLET | Freq: Every day | ORAL | Status: DC | PRN

## 2021-05-18 MED ORDER — PANTOPRAZOLE SODIUM 40 MG PO TBEC
40.0000 mg | DELAYED_RELEASE_TABLET | Freq: Every day | ORAL | Status: DC
Start: 2021-05-18 — End: 2021-05-18
  Administered 2021-05-18: 40 mg via ORAL
  Filled 2021-05-18: qty 1

## 2021-05-18 MED ORDER — MORPHINE SULFATE (PF) 2 MG/ML IV SOLN
2.0000 mg | Freq: Four times a day (QID) | INTRAVENOUS | Status: DC
  Administered 2021-05-18 – 2021-05-19 (×4): 2 mg via INTRAVENOUS
  Filled 2021-05-18 (×4): qty 1

## 2021-05-18 MED ORDER — PANCRELIPASE (LIP-PROT-AMYL) 12000-38000 UNITS PO CPEP
36000.0000 [IU] | ORAL_CAPSULE | Freq: Three times a day (TID) | ORAL | Status: DC
  Administered 2021-05-18 – 2021-05-19 (×4): 36000 [IU] via ORAL
  Filled 2021-05-18 (×4): qty 3

## 2021-05-18 MED ORDER — LOPERAMIDE HCL 2 MG PO CAPS: 2.0000 mg | ORAL_CAPSULE | ORAL | Status: DC | PRN

## 2021-05-18 MED ORDER — LEVOTHYROXINE SODIUM 100 MCG PO TABS
100.0000 ug | ORAL_TABLET | Freq: Every day | ORAL | Status: DC
  Administered 2021-05-19: 100 ug via ORAL
  Filled 2021-05-18: qty 1

## 2021-05-18 MED ORDER — ALBUMIN HUMAN 25 % IV SOLN
25.0000 g | Freq: Once | INTRAVENOUS | Status: AC
  Administered 2021-05-18: 25 g via INTRAVENOUS
  Filled 2021-05-18: qty 100

## 2021-05-18 NOTE — TOC Progression Note (Signed)
Transition of Care (TOC) - Progression Note  ? ? ?Patient Details  ?Name: Troy Hicks ?MRN: [id number] ?Date of Birth: October 31, 1919 ? ?Transition of Care (TOC) CM/SW Contact  ?Milas Gain, LCSWA ?Phone Number: ?05/18/2021, 11:57 AM ? ?Clinical Narrative:    ? ?CSW received consult for residential hospice for patient. CSW spoke with patients daughter Troy Hicks who gave CSW permission to make residential hospice referral to [facility]. CSW spoke with Cheri with [facility] and made referral for patient. CSW will continue to follow and assist with patients dc planning needs. ? ?  ?  ? ?Expected Discharge Plan and Services ?  ?  ?  ?  ?  ?                ?  ?  ?  ?  ?  ?  ?  ?  ?  ?  ? ? ?Social Determinants of Health (SDOH) Interventions ?  ? ?Readmission Risk Interventions ?No flowsheet data found. ? ?

## 2021-05-18 NOTE — Progress Notes (Addendum)
Daily Progress Note   Patient Name: Troy Hicks       Date: 05/18/2021 DOB: 1919/07/03  Age: 88 y.o. MRN#: [id number] Attending Physician: Eugenie Filler, MD Primary Care Physician: [name], FNP Admit Date: 05/15/2021  Reason for Consultation/Follow-up: Non pain symptom management, Pain control, Psychosocial/spiritual support, and Terminal Care  Subjective: Chart review performed. Received report from primary RN - no acute concerns. Education provided that scheduled medications are for dyspnea and to not skip doses. Per RN, patient did have minimal amount of breakfast.  Went to visit patient - no family/visitors present. Patient was lying in bed asleep - I did not attempt to wake him. No signs or non-verbal gestures of pain or discomfort noted. No respiratory distress or secretions noted; slight increased work of breathing.    Length of Stay: 3  Current Medications: Scheduled Meds:   acetaminophen  500 mg Oral TID   escitalopram  10 mg Oral Daily   furosemide  40 mg Intravenous TID AC   insulin aspart  0-9 Units Subcutaneous TID AC & HS   insulin glargine-yfgn  10 Units Subcutaneous QHS   latanoprost  1 drop Both Eyes QHS   metoprolol tartrate  25 mg Oral BID    morphine injection  2 mg Intravenous Q6H   predniSONE  20 mg Oral Q breakfast   traZODone  25 mg Oral QHS    Continuous Infusions:   PRN Meds: antiseptic oral rinse, glycopyrrolate **OR** glycopyrrolate **OR** glycopyrrolate, haloperidol **OR** haloperidol **OR** haloperidol lactate, LORazepam **OR** LORazepam, morphine injection, ondansetron **OR** ondansetron (ZOFRAN) IV, polyvinyl alcohol  Physical Exam Vitals and nursing note reviewed.  Constitutional:      General: He is not in acute  distress. Pulmonary:     Effort: No respiratory distress.  Skin:    General: Skin is warm and dry.  Neurological:     Mental Status: He is lethargic.     Motor: Weakness present.            Vital Signs: BP (!) 110/57 (BP Location: Right Arm)    Pulse 61    Temp (!) 96.9 F (36.1 C) (Axillary)    Resp 16    Ht 5\' 7"  (1.702 m)    Wt 79.5 kg    SpO2 98%    BMI 27.45  kg/m  SpO2: SpO2: 98 % O2 Device: O2 Device: Room Air O2 Flow Rate: O2 Flow Rate (L/min): 2 L/min  Intake/output summary:  Intake/Output Summary (Last 24 hours) at 05/18/2021 1217 Last data filed at 05/18/2021 1142 Gross per 24 hour  Intake 337.95 ml  Output 1900 ml  Net -1562.05 ml   LBM: Last BM Date : 06/13/21 Baseline Weight: Weight: 85.9 kg Most recent weight: Weight: 79.5 kg       Palliative Assessment/Data: PPS 20%      Patient Active Problem List   Diagnosis Date Noted   Acute on chronic congestive heart failure (HCC)    Atrial fibrillation with rapid ventricular response (HCC)    Stage 3b chronic kidney disease ([place])    Acute CHF (congestive heart failure) ([place]) 05/15/2021   Chronic atrial fibrillation with RVR (HCC) 26/37/8588   Chronic systolic CHF (congestive heart failure) ([place]) 04/21/2021   CKD (chronic kidney disease) stage 4, GFR 15-29 ml/min (HCC) 04/21/2021   Generalized anxiety disorder 04/19/2021   Recurrent major depression in remission ([place]) 04/19/2021   Intractable back pain 04/18/2021   Epidermal cyst 10/05/2020   Abnormal gait 06/15/2020   Abrasion of right hand 06/15/2020   Actinic keratosis 06/15/2020   Adjustment disorder with depressed mood 06/15/2020   Arthropathy of pelvic region and thigh 06/15/2020   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 06/15/2020   Bilateral pseudophakia 06/15/2020   Body mass index (BMI) 31.0-31.9, adult 06/15/2020   Cellulitis and abscess of trunk 06/15/2020   Chronic diarrhea of unknown origin 06/15/2020    Chronic diastolic heart failure ([place]) 06/15/2020   Chronic pain 06/15/2020   Dementia ([place]) 06/15/2020   Diabetic renal disease ([place]) 06/15/2020   Edema 06/15/2020   Esophageal dysphagia 06/15/2020   Foreign body in ear 06/15/2020   Fecal smearing 06/15/2020   Generalized osteoarthritis 06/15/2020   Glaucoma 06/15/2020   Heart disease 06/15/2020   Peripheral vascular disease ([place]) 06/15/2020   History of colonic polyps 06/15/2020   History of endocrine disorder 06/15/2020   History of fall 06/15/2020   Hyperglycemia due to type 2 diabetes mellitus ([place]) 06/15/2020   Hyperlipidemia 06/15/2020   Hypothyroid 06/15/2020   Long term (current) use of insulin ([place]) 06/15/2020   Low back pain 06/15/2020   Lumbosacral spondylosis without myelopathy 06/15/2020   Moderate recurrent major depression ([place]) 06/15/2020   Morbid obesity ([place]) 06/15/2020   Nonexudative age-related macular degeneration 06/15/2020   Orthostatic hypotension 06/15/2020   Other ill-defined and unknown causes of morbidity and mortality 06/15/2020   Other psoriasis 06/15/2020   Paroxysmal atrial fibrillation ([place]) 06/15/2020   Patient's noncompliance with dietary regimen 06/15/2020   Gout 06/15/2020   Insomnia 06/15/2020   Psychosexual dysfunction with inhibited sexual excitement 06/15/2020   Rectal prolapse 06/15/2020   Recurrent falls 06/15/2020   Chronic kidney disease due to hypertension 06/15/2020   Type 2 diabetes mellitus with other diabetic kidney complication ([place]) 50/27/7412   Abdominal aortic atherosclerosis ([place]) 05/16/2020   Degeneration of lumbar intervertebral disc 05/16/2020   Essential hypertension 05/16/2020   Lumbar spondylosis 05/16/2020   Thrombocytopenia ([place]) 07/21/2019   Perianal abscess 05/14/2019   Diarrhea 05/14/2019   Osteoarthritis of elbow 11/26/2018   Osteoarthritis of glenohumeral joint 11/26/2018   Pain in joint of left shoulder 11/25/2018   Pain in joint of right shoulder  11/25/2018   Pain in elbow 11/25/2018   Shortness of breath 04/10/2018   Ischemic cardiomyopathy 04/10/2018    Palliative Care  Assessment & Plan   Patient Profile: 88 y.o. male  with past medical history of end-stage CHF (EF less than 20%), atrial fibrillation, peripheral vascular disease, dementia, CKD stage III, diabetes type 2, high thrombocytopenia, dysphagia, gout, glaucoma, hypothyroidism who presented to the emergency department on 05/15/2021 with shortness of breath. He has dyspnea at baseline, but reported progression over the past 24 hours. He also reported orthopnea, dyspnea at rest even while sitting up, as well as worsening lower extremity edema. In the ED found to have significant volume overload on imaging. Also found to be in afib with RVR requiring cardizem infusion. Admitted to [facility] with acute on chronic CHF secondary to ischemic cardiomyopathy. Of note, cardiology has previously signed off.  Assessment: Acute on chronic HFrEF Ischemic cardiomyopathy Atrial fibrillation with RVR CKD stage 4 Dyspnea Dementia Terminal care  Recommendations/Plan: Continue full comfort measures Continue DNR/DNI as previously documented Transfer to [facility] location - evaluation still pending; second choice is [facility] Discontinued orders that were not focused on comfort Adjusted Morphine 1-2mg  q6h to 2mg  q6h for dyspnea; continue PRN dose Continue lexapro, lasix, synthroid, creon, lopressor, trazodone, prednisone while he's still tolerating POs Continue insulin while he's still eating/drinking and CBGs are above 150 unless he no longer would like CBG checks. If oral intake decreases or CBGs are consistently below 150 would discontinue. Hospice will discontinue at discharge.  Continue other comfort focused medication regimen - patient appears comfortable Nursing to provide frequent assessments and administer PRN medications as clinically necessary to ensure EOL  comfort PMT will continue to follow and support holistically  Goals of Care and Additional Recommendations: Limitations on Scope of Treatment: Full Comfort Care  Code Status:    Code Status Orders  (From admission, onward)           Start     Ordered   05/17/21 1751  Do not attempt resuscitation (DNR)  Continuous       Question Answer Comment  In the event of cardiac or respiratory ARREST Do not call a code blue   In the event of cardiac or respiratory ARREST Do not perform Intubation, CPR, defibrillation or ACLS   In the event of cardiac or respiratory ARREST Use medication by any route, position, wound care, and other measures to relive pain and suffering. May use oxygen, suction and manual treatment of airway obstruction as needed for comfort.      05/17/21 1752           Code Status History     Date Active Date Inactive Code Status Order ID Comments User Context   05/17/2021 1750 05/17/2021 1752 DNR [id number]  Lavena Bullion, NP Inpatient   05/15/2021 1901 05/17/2021 1750 DNR [id number]  Albertine Patricia, MD ED   04/19/2021 0006 04/27/2021 2311 DNR [id number]  Marcelyn Bruins, MD ED   04/18/2021 2254 04/19/2021 0006 Full Code [id number]  Marcelyn Bruins, MD ED      Advance Directive Documentation    Flowsheet Row Most Recent Value  Type of Advance Directive Healthcare Power of Attorney  Pre-existing out of facility DNR order (yellow form or pink MOST form) --  "MOST" Form in Place? --       Prognosis:  Poor in the setting of advanced age, dementia, recurrent hospitalizations, decreased oral intake, and multiple comorbidities  Discharge Planning: Hospice facility  Care plan was discussed with primary RN  Thank you for allowing the Palliative Medicine Team to assist  in the care of this patient.   Lin Landsman, NP  Please contact Palliative Medicine Team phone at [phone] for questions and concerns.

## 2021-05-18 NOTE — Progress Notes (Signed)
PROGRESS NOTE    Troy Hicks  OHY:[id number] DOB: [birth year] DOA: 05/15/2021 PCP: [name], FNP    Chief Complaint  Patient presents with   Shortness of Breath    Brief Narrative:  Troy Hicks  is a 88 y.o. male, medical history significant of diabetes, high thrombocytopenia, hyperlipidemia, peripheral vascular disease, atrial fibrillation, depression, hypothyroidism, CHF, gout, glaucoma, hypertension, dysphagia, dementia, CKD 3 who presented with complaints of shortness of breath. -Patient recently hospitalized due to chronic lower back pain, where he was discharged on prednisone. -Patient presents to ED secondary to complaints of shortness of breath, where he was noted to be in A-fib with RVR and in volume overload, he was admitted for further management.   Palliative care consulted as patient with end-stage CHF.     Assessment & Plan:   Principal Problem:   Acute CHF (congestive heart failure) (HCC) Active Problems:   Shortness of breath   Ischemic cardiomyopathy   Dementia (HCC)   CKD (chronic kidney disease) stage 4, GFR 15-29 ml/min (HCC)   Chronic atrial fibrillation with RVR (HCC)   Acute on chronic congestive heart failure (HCC)   Atrial fibrillation with rapid ventricular response (HCC)   Stage 3b chronic kidney disease (HCC)  Acute on chronic systemic CHF/ischemic cardiomyopathy -Patient presented with evidence of volume overload, +3 edema all the way to the abdomen, bibasilar Rales, volume overload on imaging and elevated BNP and JVD - worsened by A-fib with RVR - Echocardiogram 08/25/2019 showed EF less than 20, G1 DD, trivial mitral valve regurgitation, and mild aortic dilation.  No need to repeat as it would not alter management. -Patient currently on IV Lasix which was increased to 40 mg 3 times daily with a urine output of 2.1 L over the past 24 hours. -Patient is -5.762 L during this hospitalization. -Status post dose of metolazone 2.5 mg x 1  05/16/2021. -Strict I's and O's, daily weights -Pressure borderline, patient on midodrine.  Status post dose of IV albumin on 05/17/2021 with continuation of IV Lasix.   -Slowly clinically improving however still significantly volume overloaded on examination.   -Continue IV Lasix for another 24 hours and if continued improvement could likely transition to oral Lasix in the next 24 to 48 hours.   -Patient end-stage CHF, cardiology has signed off on him as an outpatient and he is under palliative as an outpatient. -Palliative care consulted for goals of care, decision made to transition to full comfort measures and patient to be discharged to a residential hospice home.      A-fib with RVR -Heart rate uncontrolled in the 140s on presentation, required Cardizem drip which has subsequently been discontinued. -Status post IV digoxin initially -Continue metoprolol for rate control.  -Not a candidate for anticoagulation given his thrombocytopenia, age, frailty and fall. -Patient seen by palliative care and transition to full comfort measures.   Elevated  troponins -Due to above, in the setting of acute CHF, A-fib with RVR, and CKD -He denies any chest pain - 164>>429>>369 -Palliative care following and decision made to transition to full comfort measures. -Continue current conservative treatment with  IV diuretics, Lopressor.    Dyspnea -Patient noted to be symptomatic with dyspnea leading to anxiety and great amount of concern.   -Continue low-dose morphine as needed for dyspnea.   -Palliative care following and patient has been transitioned to full comfort measures.  As needed morphine adjusted per palliative care.   CKD stage 4 -Avoid nephrotoxic agents.   -  Renal function stable.   -Monitor with diuresis.     Chronic lower back pain/lumbosacral spondylolysis without myelopathy -During recent hospitalization, continue with as needed pain control -Patient was discharged on prolonged  prednisone taper, will continue currently at 20 mg oral daily and taper gradually.  He will need to continue his prolonged taper on discharge -Continue scheduled Tylenol 500 mg 3 times daily.   -Vicodin as needed.    History of depression -Lexapro, trazodone.    Hypothyroidism -Synthroid discontinued per palliative care as patient transition to full comfort measures, will resume for now.   Hyperlipidemia- (present on admission) Patient transition to full comfort measures and as such colestipol discontinued per palliative care.    Diabetes mellitus, type II, insulin-dependent -Well-controlled with A1c 6.6 (04/19/2021) -CBG 106 this morning.   -Patient transitioned to full comfort measures per palliative care.   -Discontinue CBG checks.   -Continue Semglee 10 units daily.    Dementia  Stable.     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- (present on admission) -Patient transition to full comfort measures.  Aspirin discontinued per palliative care.   Thrombocytopenia  -Chronic, at baseline   Goals of care -Palliative medicine input greatly appreciated, overall continue with conservative management, no heroics, and likely plan for discharge to residential hospice home.  TOC consulted and patient transition to full comfort measures..  Palliative care following.    DVT prophylaxis: Heparin Code Status: DNR Family Communication: Updated patient.  No family at bedside Disposition:   Status is: Inpatient Remains inpatient appropriate because: Severity of illness.           Consultants:  Palliative care  Procedures:  Chest x-ray 05/15/2021   Antimicrobials:  None   Subjective: Sleeping but easily arousable.  Feels shortness of breath is improving.  Denies any chest pain.  No abdominal pain.    Objective: Vitals:   05/17/21 1409 05/17/21 1705 05/17/21 2032 05/18/21 0941  BP:  (!) 133/58 (!) 110/59 (!) 110/57  Pulse:  87 70 61   Resp:  16  16  Temp:  98.1 F (36.7 C) 97.7 F (36.5 C) (!) 96.9 F (36.1 C)  TempSrc:  Oral Oral Axillary  SpO2: 99% 99% 97% 98%  Weight:      Height:        Intake/Output Summary (Last 24 hours) at 05/18/2021 1057 Last data filed at 05/17/2021 2130 Gross per 24 hour  Intake 337.95 ml  Output 1000 ml  Net -662.05 ml    Filed Weights   05/15/21 2109 05/16/21 0610 05/17/21 0516  Weight: 85.9 kg 86.1 kg 79.5 kg    Examination:  General exam: Appears calm and comfortable  Respiratory system: Clear to auscultation anterior lung fields.  No wheezes, fair air movement.  Speaking in full sentences.  Normal respiratory effort. Cardiovascular system: Regular rate rhythm.  Positive JVD.  2+ bilateral lower extremity edema up to hips. Gastrointestinal system: Abdomen is nondistended, soft and nontender. No organomegaly or masses felt. Normal bowel sounds heard. Central nervous system: Alert and oriented. No focal neurological deficits. Extremities: Symmetric 5 x 5 power. Skin: No rashes, lesions or ulcers Psychiatry: Judgement and insight appear fair. Mood & affect appropriate.     Data Reviewed: I have personally reviewed following labs and imaging studies  CBC: Recent Labs  Lab 05/15/21 1452 05/16/21 0153 05/17/21 0211  WBC 14.0* 10.9* 9.7  HGB 9.4* 9.1* 8.9*  HCT 29.5* 26.8* 26.2*  MCV 99.0 95.7 95.3  PLT  90* 88* 79*     Basic Metabolic Panel: Recent Labs  Lab 05/15/21 1452 05/16/21 0153 05/17/21 0211 05/18/21 0824  NA 135 134* 133* 134*  K 5.1 4.7 4.9 4.4  CL 103 105 105 97*  CO2 20* 20* 20* 27  GLUCOSE 234* 268* 201* 108*  BUN 52* 54* 61* 68*  CREATININE 1.76* 1.66* 1.97* 1.96*  CALCIUM 7.7* 7.6* 7.6* 8.2*  MG 2.0  --  2.0  --      GFR: Estimated Creatinine Clearance: 20.8 mL/min (A) (by C-G formula based on SCr of 1.96 mg/dL (H)).  Liver Function Tests: No results for input(s): AST, ALT, ALKPHOS, BILITOT, PROT, ALBUMIN in the last 168  hours.  CBG: Recent Labs  Lab 05/17/21 1153 05/17/21 1755 05/17/21 2054 05/18/21 0751 05/18/21 0822  GLUCAP 167* 253* 262* 68* 106*      Recent Results (from the past 240 hour(s))  Resp Panel by RT-PCR (Flu A&B, Covid) Nasopharyngeal Swab     Status: None   Collection Time: 05/15/21  3:19 PM   Specimen: Nasopharyngeal Swab; Nasopharyngeal(NP) swabs in vial transport medium  Result Value Ref Range Status   SARS Coronavirus 2 by RT PCR NEGATIVE NEGATIVE Final    Comment: (NOTE) SARS-CoV-2 target nucleic acids are NOT DETECTED.  The SARS-CoV-2 RNA is generally detectable in upper respiratory specimens during the acute phase of infection. The lowest concentration of SARS-CoV-2 viral copies this assay can detect is 138 copies/mL. A negative result does not preclude SARS-Cov-2 infection and should not be used as the sole basis for treatment or other patient management decisions. A negative result may occur with  improper specimen collection/handling, submission of specimen other than nasopharyngeal swab, presence of viral mutation(s) within the areas targeted by this assay, and inadequate number of viral copies(<138 copies/mL). A negative result must be combined with clinical observations, patient history, and epidemiological information. The expected result is Negative.  Fact Sheet for Patients:  EntrepreneurPulse.com.au  Fact Sheet for Healthcare Providers:  IncredibleEmployment.be  This test is no t yet approved or cleared by the [place] FDA and  has been authorized for detection and/or diagnosis of SARS-CoV-2 by FDA under an Emergency Use Authorization (EUA). This EUA will remain  in effect (meaning this test can be used) for the duration of the COVID-19 declaration under Section 564(b)(1) of the Act, 21 U.S.C.section 360bbb-3(b)(1), unless the authorization is terminated  or revoked sooner.       Influenza A by PCR NEGATIVE  NEGATIVE Final   Influenza B by PCR NEGATIVE NEGATIVE Final    Comment: (NOTE) The Xpert Xpress SARS-CoV-2/FLU/RSV plus assay is intended as an aid in the diagnosis of influenza from Nasopharyngeal swab specimens and should not be used as a sole basis for treatment. Nasal washings and aspirates are unacceptable for Xpert Xpress SARS-CoV-2/FLU/RSV testing.  Fact Sheet for Patients: EntrepreneurPulse.com.au  Fact Sheet for Healthcare Providers: IncredibleEmployment.be  This test is not yet approved or cleared by the [place] FDA and has been authorized for detection and/or diagnosis of SARS-CoV-2 by FDA under an Emergency Use Authorization (EUA). This EUA will remain in effect (meaning this test can be used) for the duration of the COVID-19 declaration under Section 564(b)(1) of the Act, 21 U.S.C. section 360bbb-3(b)(1), unless the authorization is terminated or revoked.  Performed at [facility], [place] [address], [place], [place] [address]           Radiology Studies: No results found.  Scheduled Meds:  acetaminophen  500 mg Oral TID   escitalopram  10 mg Oral Daily   furosemide  40 mg Intravenous TID AC   insulin aspart  0-9 Units Subcutaneous TID AC & HS   insulin glargine-yfgn  10 Units Subcutaneous QHS   latanoprost  1 drop Both Eyes QHS   metoprolol tartrate  25 mg Oral BID    morphine injection  1-2 mg Intravenous Q6H   predniSONE  20 mg Oral Q breakfast   traZODone  25 mg Oral QHS   Continuous Infusions:     LOS: 3 days    Time spent: 35 minutes    Irine Seal, MD [facility]   To contact the attending provider between 7A-7P or the covering provider during after hours 7P-7A, please log into the web site [email or web address] and access using universal [place] password for that web site. If you do not have the password, please call the hospital operator.  05/18/2021, 10:57 AM

## 2021-05-18 NOTE — Progress Notes (Signed)
A&Ox3 (disoriented to time) with some confusion/memory issues; however conversive (some slurring of words) and in good spirits. Somewhat fidgety in bed. Patient became very fixated with callbell after explaining how to call for assistance and use buttons for the lights in the room many times. Was continuously trying to press the buttons. Also explained primofit many times; patient has removed it twice this shift and needing continued explanation of its purpose and use. Found IV out. Unsure if patient pulled out or if accident. Remains in Afib with heart rate 70-80s. Vitals stable. No complaints of pain or shortness of breath, on room air.  ?

## 2021-05-19 LAB — BASIC METABOLIC PANEL
Anion gap: 10 (ref 5–15)
BUN: 76 mg/dL — ABNORMAL HIGH (ref 8–23)
CO2: 25 mmol/L (ref 22–32)
Calcium: 8 mg/dL — ABNORMAL LOW (ref 8.9–10.3)
Chloride: 96 mmol/L — ABNORMAL LOW (ref 98–111)
Creatinine, Ser: 2.12 mg/dL — ABNORMAL HIGH (ref 0.61–1.24)
GFR, Estimated: 27 mL/min — ABNORMAL LOW (ref >60)
Glucose, Bld: 152 mg/dL — ABNORMAL HIGH (ref 70–99)
Potassium: 4.3 mmol/L (ref 3.5–5.1)
Sodium: 131 mmol/L — ABNORMAL LOW (ref 135–145)

## 2021-05-19 LAB — GLUCOSE, CAPILLARY
Glucose-Capillary: 181 mg/dL — ABNORMAL HIGH (ref 70–99)
Glucose-Capillary: 115 mg/dL — ABNORMAL HIGH (ref 70–99)
Glucose-Capillary: 290 mg/dL — ABNORMAL HIGH (ref 70–99)
Glucose-Capillary: 310 mg/dL — ABNORMAL HIGH (ref 70–99)

## 2021-05-19 MED ORDER — LORAZEPAM 0.5 MG PO TABS: 0.5000 mg | ORAL_TABLET | ORAL | 0 refills | Status: AC | PRN

## 2021-05-19 MED ORDER — POLYVINYL ALCOHOL 1.4 % OP SOLN: 1.0000 [drp] | Freq: Four times a day (QID) | OPHTHALMIC | 0 refills | Status: AC | PRN

## 2021-05-19 MED ORDER — FUROSEMIDE 10 MG/ML IJ SOLN
40.0000 mg | Freq: Two times a day (BID) | INTRAMUSCULAR | Status: DC
  Administered 2021-05-19: 40 mg via INTRAVENOUS
  Filled 2021-05-19: qty 4

## 2021-05-19 MED ORDER — INSULIN GLARGINE-YFGN 100 UNIT/ML ~~LOC~~ SOLN
12.0000 [IU] | Freq: Every day | SUBCUTANEOUS | Status: DC
  Administered 2021-05-19: 12 [IU] via SUBCUTANEOUS
  Filled 2021-05-19: qty 0.12

## 2021-05-19 MED ORDER — METOPROLOL TARTRATE 25 MG PO TABS: 25.0000 mg | ORAL_TABLET | Freq: Two times a day (BID) | ORAL | 1 refills | Status: AC

## 2021-05-19 MED ORDER — INSULIN GLARGINE 100 UNIT/ML ~~LOC~~ SOLN: 15.0000 [IU] | Freq: Every day | SUBCUTANEOUS | 0 refills | Status: AC

## 2021-05-19 MED ORDER — GLYCOPYRROLATE 1 MG PO TABS: 1.0000 mg | ORAL_TABLET | ORAL | 0 refills | Status: AC | PRN

## 2021-05-19 MED ORDER — HALOPERIDOL 0.5 MG PO TABS
0.5000 mg | ORAL_TABLET | ORAL | 0 refills | Status: AC | PRN
Start: 2021-05-19 — End: ?

## 2021-05-19 MED ORDER — MORPHINE SULFATE (CONCENTRATE) 10 MG /0.5 ML PO SOLN: 5.0000 mg | ORAL | 0 refills | Status: AC | PRN

## 2021-05-19 MED ORDER — LIDOCAINE 5 % EX PTCH: 1.0000 | MEDICATED_PATCH | CUTANEOUS | 0 refills | Status: AC

## 2021-05-19 NOTE — Discharge Summary (Signed)
Physician Discharge Summary  Troy Hicks WVP:[id number] DOB: 09-23-1919 DOA: 05/15/2021  PCP: Troy Loader, FNP  Admit date: 05/15/2021 Discharge date: 05/19/2021  Time spent: 60 minutes  Recommendations for Outpatient Follow-up:  To be discharged to residential hospice home.  Follow-up with MD at residential hospice home.   Discharge Diagnoses:  Principal Problem:   Acute CHF (congestive heart failure) (HCC) Active Problems:   Shortness of breath   Ischemic cardiomyopathy   Dementia (HCC)   CKD (chronic kidney disease) stage 4, GFR 15-29 ml/min (HCC)   Chronic atrial fibrillation with RVR (HCC)   Acute on chronic congestive heart failure (HCC)   Atrial fibrillation with rapid ventricular response (HCC)   Stage 3b chronic kidney disease [facility])   Discharge Condition: Stable  Diet recommendation: Regular  Filed Weights   05/15/21 2109 05/16/21 0610 05/17/21 0516  Weight: 85.9 kg 86.1 kg 79.5 kg    History of present illness:  HPI per Dr.Elgergawy Troy Hicks  is a 88 y.o. male, medical history significant of diabetes, high thrombocytopenia, hyperlipidemia, peripheral vascular disease, atrial fibrillation, depression, hypothyroidism, CHF, gout, glaucoma, hypertension, dysphagia, dementia, CKD 3 who presented with complaints of shortness of breath. -Patient recently hospitalized due to chronic lower back pain, where he was discharged on prednisone. -Patient presents to ED secondary to complaints of shortness of breath, he is with dyspnea at baseline, but reported has been significant problem over the last 24 hours, patient reports orthopnea, even currently dyspnea at rest without laying supine, as well reports worsening lower extremity edema, he denies any chest pain, fever, chills, cough, nausea or vomiting, patient with known history of CHF, compliant with his Lasix, he is on 80 mg oral daily, his dose was doubled yesterday where he received 80x2, as well he received 80x2 today  without much relief, so patient was sent to ED for further evaluation, with known history of end-stage CHF, with EF less than 20%, daughter at bedside report father has been end-stage CHF where he has been following with palliative for last 2 years, and cardiology has signed off. -in ED exam significant for volume overload, JVD, and volume overload on imaging, he was in A-fib with RVR heart rate in the 140s where he required Cardizem drip, [facility] hospitalist consulted to admit.  Hospital Course:  Acute on chronic systemic CHF/ischemic cardiomyopathy -Patient presented with evidence of volume overload, +3 edema all the way to the abdomen, bibasilar Rales, volume overload on imaging and elevated BNP and JVD - worsened by A-fib with RVR - Echocardiogram 08/25/2019 showed EF less than 20, G1 DD, trivial mitral valve regurgitation, and mild aortic dilation.  No need to repeat as it would not alter management. -Patient was placed on IV Lasix 40 mg 3 times daily during the hospitalization with good urine output.   -Patient was -7.263 L during his hospitalization. -Status post dose of metolazone 2.5 mg x 1 05/16/2021. -Strict I's and O's, daily weights -Pressure borderline, Status post dose of IV albumin on 05/17/2021, 05/18/2021 with continuation of IV Lasix.   -Slowly clinically improved during the hospitalization.   -Patient end-stage CHF, cardiology has signed off on him as an outpatient and he is under palliative as an outpatient. -Palliative care consulted for goals of care, decision made to transition to full comfort measures and patient to be discharged to a residential hospice home.  -Patient will be discharged to residential hospice home, on home regimen of oral Lasix.     A-fib with RVR -Heart rate  uncontrolled in the 140s on presentation, required Cardizem drip which has subsequently been discontinued. -Status post IV digoxin initially -Patient was maintained on metoprolol for rate control.  -Not  a candidate for anticoagulation given his thrombocytopenia, age, frailty and fall. -Patient seen by palliative care and transitioned to full comfort measures.   Elevated  troponins -Due to above, in the setting of acute CHF, A-fib with RVR, and CKD -He denies any chest pain - [phone]>>[phone] -Palliative care following and decision made to transition to full comfort measures. -Patient maintained on conservative treatment with IV diuretics and IV Lopressor.  -Patient remained chest pain-free  -Patient transitioned to full comfort measures and will be discharged to residential hospice home.    Dyspnea -Patient noted to be symptomatic with dyspnea leading to anxiety and great amount of concern.   -Patient maintained on low-dose morphine as needed for dyspnea.   -Palliative care following and patien transitioned to full comfort measures.  As needed morphine adjusted per palliative care. -Patient will be discharged to residential hospice home.   CKD stage 4 -Avoid nephrotoxic agents.   -Renal function stable.   Chronic lower back pain/lumbosacral spondylolysis without myelopathy -During recent hospitalization, continue with as needed pain control -Patient was discharged on prolonged prednisone taper, will continue currently at 20 mg oral daily and taper gradually.  He will need to continue his prolonged taper on discharge -Patient maintained on scheduled Tylenol 500 mg 3 times daily during the hospitalization as well as morphine as needed.    History of depression -Patient maintained on home regimen Lexapro, trazodone.    Hypothyroidism -Patient maintained on Synthroid.    Hyperlipidemia- (present on admission) Patient transitioned to full comfort measures and as such colestipol discontinued.   Diabetes mellitus, type II, insulin-dependent -Well-controlled with A1c 6.6 (04/19/2021) -Patient transitioned to full comfort measures per palliative care.   -Patient maintained on long-acting  insulin during the hospitalization.   Dementia  Stable.     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- (present on admission) -Patient transitioned to full comfort measures.  Aspirin discontinued per palliative care.   Thrombocytopenia  -Chronic, at baseline   Goals of care -Palliative medicine input greatly appreciated, overall continue with conservative management, no heroics, and likely plan for discharge to residential hospice home.  TOC consulted and patient transitioned to full comfort measures..  Patient will be discharged to residential hospice home.    Procedures: Chest x-ray 05/15/2021  Consultations: Palliative care  Discharge Exam: Vitals:   05/19/21 0851 05/19/21 1118  BP:  122/77  Pulse:  74  Resp:  20  Temp:  (!) 97.5 F (36.4 C)  SpO2: 97% 99%    General: NAD.  Elderly gentleman. Cardiovascular: RRR no murmurs rubs or gallops.  1-2+ bilateral lower extremity edema. Respiratory: CTA B anterior lung fields.  No wheezes.  Fair air movement.  Some decreased breath sounds in the bases.  Discharge Instructions   Discharge Instructions     Diet general   Complete by: As directed    Increase activity slowly   Complete by: As directed       Allergies as of 05/19/2021       Reactions   Codeine Nausea Only   Metformin And Related Other (See Comments)   GI upset   Other Rash        Medication List     STOP taking these medications    Alcohol Wipes 70 % Pads   aspirin EC 81  MG tablet   colestipol 1 g tablet Commonly known as: COLESTID   HYDROcodone-acetaminophen 5-325 MG tablet Commonly known as: NORCO/VICODIN   iron polysaccharides 150 MG capsule Commonly known as: NIFEREX   lactose free nutrition Liqd   melatonin 3 MG Tabs tablet       TAKE these medications    acetaminophen 500 MG tablet Commonly known as: TYLENOL Take 500 mg by mouth 3 (three) times daily.   Alogliptin Benzoate 25 MG  Tabs Take 25 mg by mouth daily.   Dextran 70-Hypromellose (PF) 0.1-0.3 % Soln Place 1 drop into both eyes 3 (three) times daily.   escitalopram 10 MG tablet Commonly known as: LEXAPRO Take 10 mg by mouth daily.   furosemide 80 MG tablet Commonly known as: LASIX Take 1 tablet (80 mg total) by mouth daily.   glycopyrrolate 1 MG tablet Commonly known as: ROBINUL Take 1 tablet (1 mg total) by mouth every 4 (four) hours as needed (excessive secretions).   haloperidol 0.5 MG tablet Commonly known as: HALDOL Take 1 tablet (0.5 mg total) by mouth every 4 (four) hours as needed for agitation (or delirium).   insulin aspart 100 UNIT/ML injection Commonly known as: novoLOG Inject 10-14 Units into the skin 3 (three) times daily before meals. Per sliding scale: CBG 200 = 0 units, 201-400 = 10 units , 401 and > 14 units. Call MD  > 400   insulin glargine 100 UNIT/ML injection Commonly known as: LANTUS Inject 0.15 mLs (15 Units total) into the skin at bedtime. What changed: how much to take   KLOR-CON M20 PO Take 20 mEq by mouth daily.   latanoprost 0.005 % ophthalmic solution Commonly known as: XALATAN Place 1 drop into both eyes at bedtime.   levothyroxine 100 MCG tablet Commonly known as: SYNTHROID Take 100 mcg by mouth daily before breakfast.   lidocaine 5 % Commonly known as: LIDODERM Place 1 patch onto the skin daily. Remove & Discard patch within 12 hours or as directed by MD remove at bedtime   lipase/protease/amylase 36000 UNITS Cpep capsule Commonly known as: CREON Take 36,000 Units by mouth 3 (three) times daily before meals.   loperamide 2 MG capsule Commonly known as: IMODIUM Take 2 mg by mouth as needed for diarrhea or loose stools. Do not exceed 6 doses in 24 hours.   LORazepam 0.5 MG tablet Commonly known as: ATIVAN Take 1 tablet (0.5 mg total) by mouth every 4 (four) hours as needed for anxiety.   metoprolol tartrate 25 MG tablet Commonly known as:  LOPRESSOR Take 1 tablet (25 mg total) by mouth 2 (two) times daily.   morphine CONCENTRATE 10 mg / 0.5 ml concentrated solution Take 0.25-0.5 mLs (5-10 mg total) by mouth every 4 (four) hours as needed for severe pain.   omeprazole 20 MG capsule Commonly known as: PRILOSEC Take 20 mg by mouth 2 (two) times daily.   polyethylene glycol 17 g packet Commonly known as: MIRALAX / GLYCOLAX Take 17 g by mouth daily.   polyvinyl alcohol 1.4 % ophthalmic solution Commonly known as: LIQUIFILM TEARS Place 1 drop into both eyes 4 (four) times daily as needed for dry eyes.   predniSONE 20 MG tablet Commonly known as: DELTASONE Take 3 tablets (60 mg total) by mouth daily with breakfast for 3 days, THEN 2.5 tablets (50 mg total) daily with breakfast for 5 days, THEN 2 tablets (40 mg total) daily with breakfast for 5 days, THEN 1.5 tablets (30 mg total) daily  with breakfast for 5 days, THEN 1 tablet (20 mg total) daily with breakfast for 5 days, THEN 0.5 tablets (10 mg total) daily with breakfast for 5 days. Start taking on: April 28, 2021   PreserVision AREDS 2 Caps Take 1 capsule by mouth daily.   senna 8.6 MG Tabs tablet Commonly known as: SENOKOT Take 2 tablets by mouth daily as needed for mild constipation.   spironolactone 25 MG tablet Commonly known as: ALDACTONE Take 12.5 mg by mouth 3 (three) times a week. Mon, Wed, Friday   traZODone 50 MG tablet Commonly known as: DESYREL Take 25 mg by mouth at bedtime. Pt's daughter at bedside stated pt has been taking for years.       Allergies  Allergen Reactions   Codeine Nausea Only   Metformin And Related Other (See Comments)    GI upset   Other Rash    Follow-up Information     MD AT Hospice Home Follow up.                   The results of significant diagnostics from this hospitalization (including imaging, microbiology, ancillary and laboratory) are listed below for reference.    Significant Diagnostic  Studies: DG Chest Port 1 View  Result Date: 05/15/2021 CLINICAL DATA:  Shortness of breath. EXAM: PORTABLE CHEST 1 VIEW COMPARISON:  September 23, 2019. FINDINGS: Stable cardiomediastinal silhouette. Sternotomy wires are noted. Increased bibasilar atelectasis or infiltrates are noted with possible small pleural effusions. Bony thorax is unremarkable. IMPRESSION: Increased bibasilar atelectasis or infiltrates are noted with possible small pleural effusions. Electronically Signed   By: Marijo Conception M.D.   On: 05/15/2021 16:38    Microbiology: Recent Results (from the past 240 hour(s))  Resp Panel by RT-PCR (Flu A&B, Covid) Nasopharyngeal Swab     Status: None   Collection Time: 05/15/21  3:19 PM   Specimen: Nasopharyngeal Swab; Nasopharyngeal(NP) swabs in vial transport medium  Result Value Ref Range Status   SARS Coronavirus 2 by RT PCR NEGATIVE NEGATIVE Final    Comment: (NOTE) SARS-CoV-2 target nucleic acids are NOT DETECTED.  The SARS-CoV-2 RNA is generally detectable in upper respiratory specimens during the acute phase of infection. The lowest concentration of SARS-CoV-2 viral copies this assay can detect is 138 copies/mL. A negative result does not preclude SARS-Cov-2 infection and should not be used as the sole basis for treatment or other patient management decisions. A negative result may occur with  improper specimen collection/handling, submission of specimen other than nasopharyngeal swab, presence of viral mutation(s) within the areas targeted by this assay, and inadequate number of viral copies(<138 copies/mL). A negative result must be combined with clinical observations, patient history, and epidemiological information. The expected result is Negative.  Fact Sheet for Patients:  EntrepreneurPulse.com.au  Fact Sheet for Healthcare Providers:  IncredibleEmployment.be  This test is no t yet approved or cleared by the [place] FDA and   has been authorized for detection and/or diagnosis of SARS-CoV-2 by FDA under an Emergency Use Authorization (EUA). This EUA will remain  in effect (meaning this test can be used) for the duration of the COVID-19 declaration under Section 564(b)(1) of the Act, 21 U.S.C.section 360bbb-3(b)(1), unless the authorization is terminated  or revoked sooner.       Influenza A by PCR NEGATIVE NEGATIVE Final   Influenza B by PCR NEGATIVE NEGATIVE Final    Comment: (NOTE) The Xpert Xpress SARS-CoV-2/FLU/RSV plus assay is intended as an aid in the  diagnosis of influenza from Nasopharyngeal swab specimens and should not be used as a sole basis for treatment. Nasal washings and aspirates are unacceptable for Xpert Xpress SARS-CoV-2/FLU/RSV testing.  Fact Sheet for Patients: EntrepreneurPulse.com.au  Fact Sheet for Healthcare Providers: IncredibleEmployment.be  This test is not yet approved or cleared by the [place] FDA and has been authorized for detection and/or diagnosis of SARS-CoV-2 by FDA under an Emergency Use Authorization (EUA). This EUA will remain in effect (meaning this test can be used) for the duration of the COVID-19 declaration under Section 564(b)(1) of the Act, 21 U.S.C. section 360bbb-3(b)(1), unless the authorization is terminated or revoked.  Performed at [facility], [place] [address], [place], [place] [address]      Labs: Basic Metabolic Panel: Recent Labs  Lab 05/15/21 1452 05/16/21 0153 05/17/21 0211 05/18/21 0824 05/19/21 0918  NA 135 134* 133* 134* 131*  K 5.1 4.7 4.9 4.4 4.3  CL 103 105 105 97* 96*  CO2 20* 20* 20* 27 25  GLUCOSE 234* 268* 201* 108* 152*  BUN 52* 54* 61* 68* 76*  CREATININE 1.76* 1.66* 1.97* 1.96* 2.12*  CALCIUM 7.7* 7.6* 7.6* 8.2* 8.0*  MG 2.0  --  2.0  --   --    Liver Function Tests: No results for input(s): AST, ALT, ALKPHOS, BILITOT, PROT, ALBUMIN in the last 168 hours. No  results for input(s): LIPASE, AMYLASE in the last 168 hours. No results for input(s): AMMONIA in the last 168 hours. CBC: Recent Labs  Lab 05/15/21 1452 05/16/21 0153 05/17/21 0211  WBC 14.0* 10.9* 9.7  HGB 9.4* 9.1* 8.9*  HCT 29.5* 26.8* 26.2*  MCV 99.0 95.7 95.3  PLT 90* 88* 79*   Cardiac Enzymes: No results for input(s): CKTOTAL, CKMB, CKMBINDEX, TROPONINI in the last 168 hours. BNP: BNP (last 3 results) No results for input(s): BNP in the last 8760 hours.  ProBNP (last 3 results) No results for input(s): PROBNP in the last 8760 hours.  CBG: Recent Labs  Lab 05/18/21 0751 05/18/21 0822 05/18/21 1144 05/18/21 1557 05/18/21 2127  GLUCAP 68* 106* 141* 267* 339*       Signed:  Irine Seal MD.  [facility] 05/19/2021, 1:10 PM

## 2021-05-19 NOTE — Progress Notes (Signed)
Report given to Rennert.  ?

## 2021-05-19 NOTE — Care Management Important Message (Signed)
Important Message ? ?Patient Details  ?Name: Troy Hicks ?MRN: [id number] ?Date of Birth: Mar 10, 1920 ? ? ?Medicare Important Message Given:  Yes ? ? ? ? ?Shelda Altes ?05/19/2021, 11:02 AM ?

## 2021-05-19 NOTE — Plan of Care (Signed)
  Problem: Cardiac: Goal: Ability to achieve and maintain adequate cardiopulmonary perfusion will improve Outcome: Progressing   

## 2021-05-19 NOTE — TOC Transition Note (Addendum)
Transition of Care (TOC) - CM/SW Discharge Note ? ? ?Patient Details  ?Name: Troy Hicks ?MRN: [id number] ?Date of Birth: [birth year] ? ?Transition of Care (TOC) CM/SW Contact:  ?Milas Gain, LCSWA ?Phone Number: ?05/19/2021, 1:49 PM ? ? ?Clinical Narrative:    ? ?Patient will DC to: [place]  ? ?Anticipated DC date: 05/19/2021 ? ?Family notified: [name]  ? ?Transport by: Corey Harold ? ?? ? ?Per MD patient ready for DC to [facility] . RN, patient, patient's family, Cheri with [facility], notified of DC. Discharge Summary sent to facility. RN given number for report tele# [phone]. DC packet on chart. DNR signed by MD attached to patients DC packet.Ambulance transport requested for patient. ? ?CSW signing off.  ? ?Final next level of care: [facility] ([place]) ?Barriers to Discharge: No Barriers Identified ? ? ?Patient Goals and CMS Choice ?  ?CMS Medicare.gov Compare Post Acute Care list provided to:: Patient Represenative (must comment) (Patients daughter [name]) ?Choice offered to / list presented to : Adult Children (Patients daughter [name]) ? ?Discharge Placement ?  ?           ?Patient chooses bed at:  [facility]) ?Patient to be transferred to facility by: [facility] ?Name of family member notified: [name] ?Patient and family notified of of transfer: 05/19/21 ? ?Discharge Plan and Services ?  ?  ?           ?  ?  ?  ?  ?  ?  ?  ?  ?  ?  ? ?Social Determinants of Health (SDOH) Interventions ?  ? ? ?Readmission Risk Interventions ?No flowsheet data found. ? ? ? ? ?

## 2021-05-20 NOTE — Progress Notes (Signed)
Pt transferred to [facility] via [facility].  Pt's daughter, [name], notified.  Hospice staff notified. Jodell Cipro ? ?

## 2021-06-17 DEATH — deceased

## 2022-03-12 IMAGING — CT CT ABD-PELV W/O CM
2 of 4 series · 16 of 46 positions shown, 18 images · non-contrast
Comparison: None.

CLINICAL DATA: Flank pain, kidney stone suspected. Back pain and
left hip pain.



[Series 3: abd/ pelvis 5.0 i30f 2 · axial · 0.88mm/px · z∈[+831,+1241]mm · 13 of 90 slices shown, 15 images]
[im 4/90  soft-tissue]
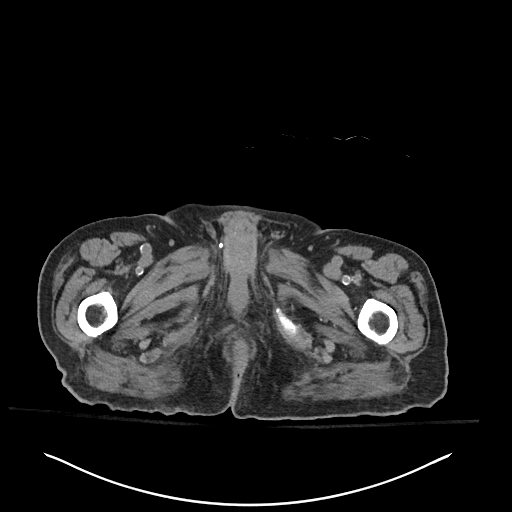
[im 4/90  bone]
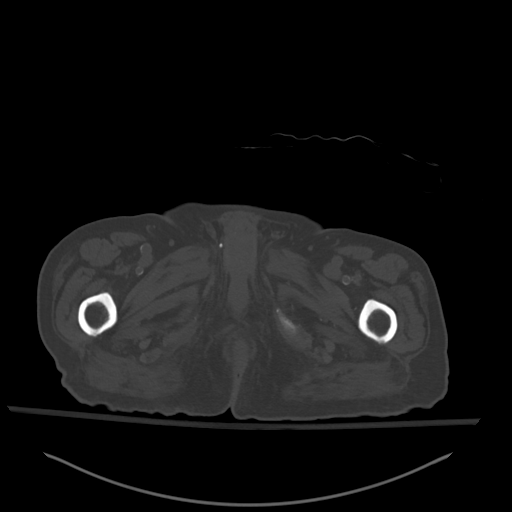
[im 12/90  soft-tissue]
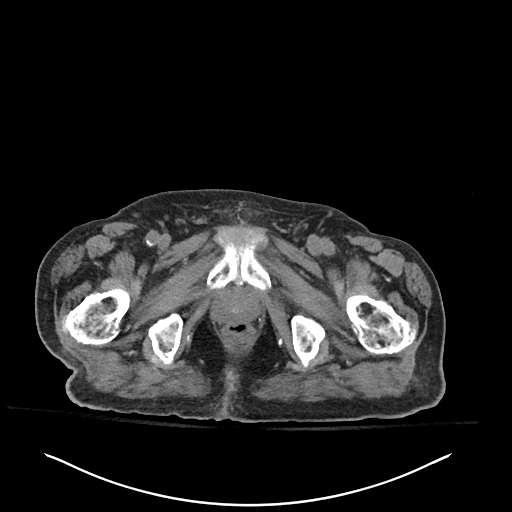
[im 19/90  soft-tissue]
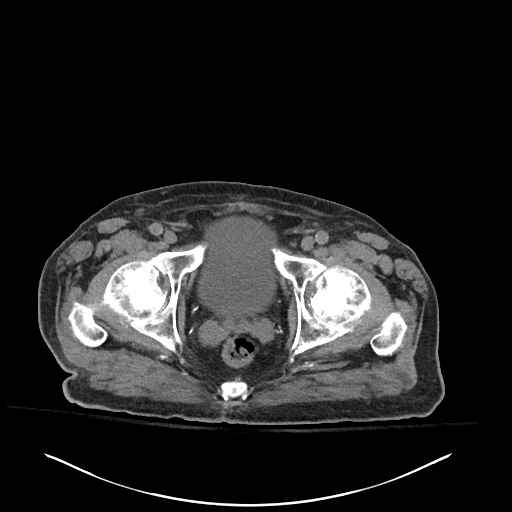
[im 26/90  soft-tissue]
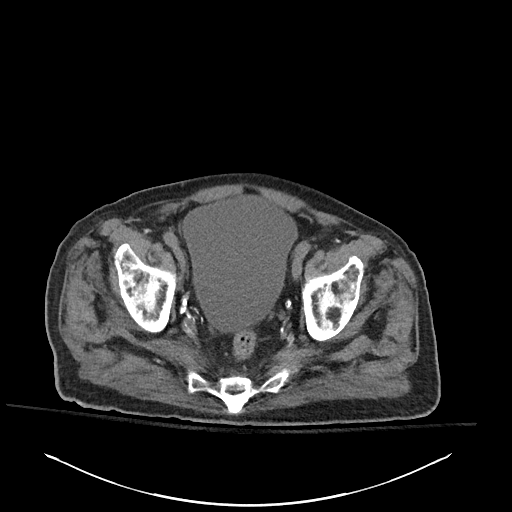
[im 30/90  soft-tissue]
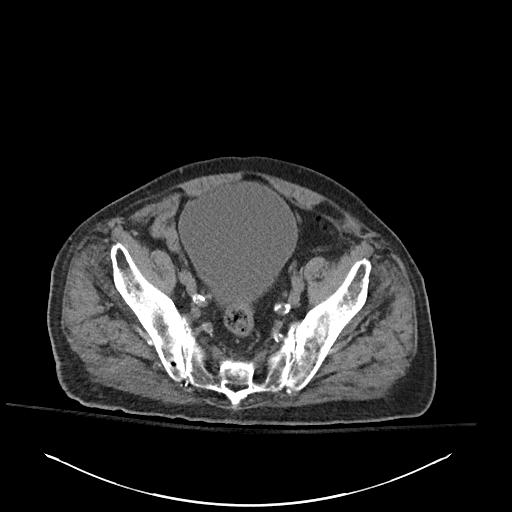
[im 38/90  soft-tissue]
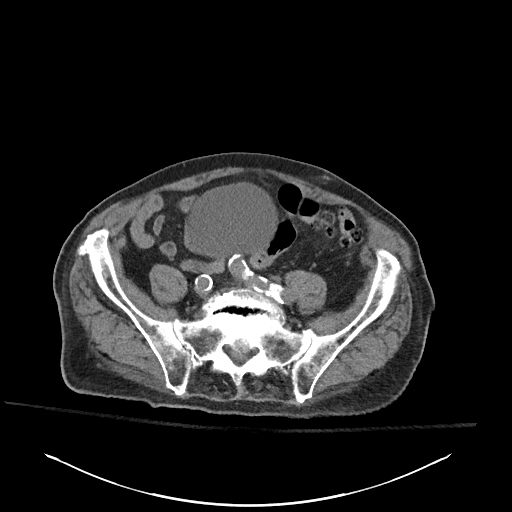
[im 45/90  soft-tissue]
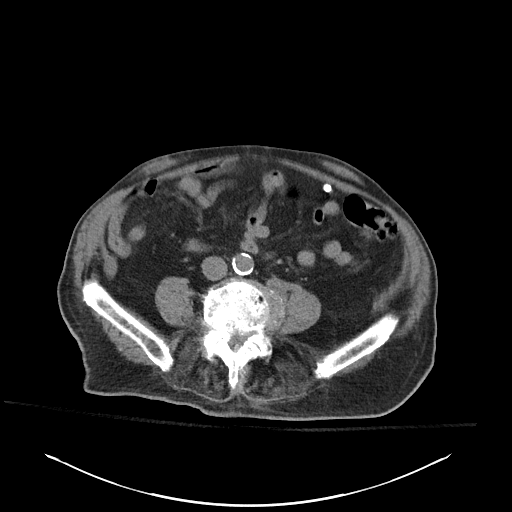
[im 52/90  soft-tissue]
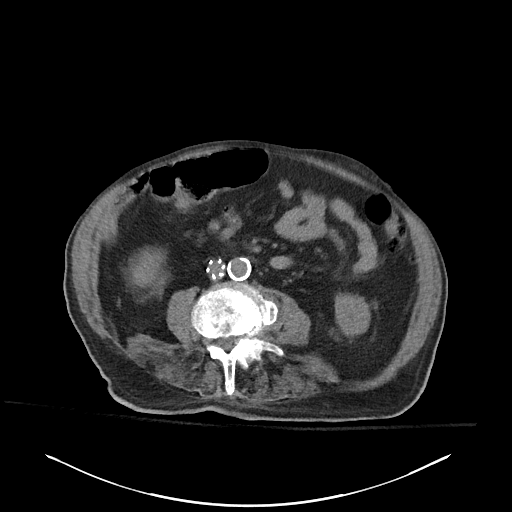
[im 60/90  soft-tissue]
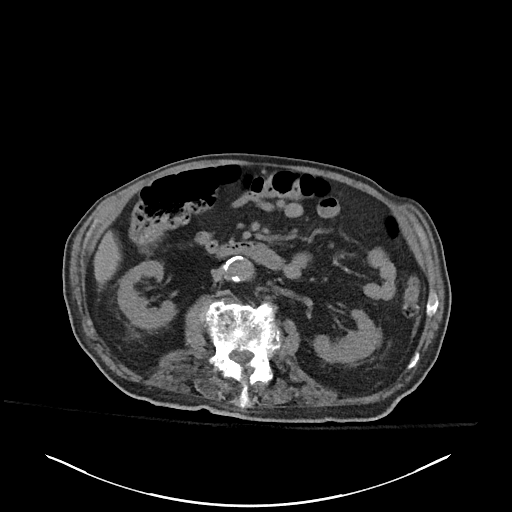
[im 60/90  bone]
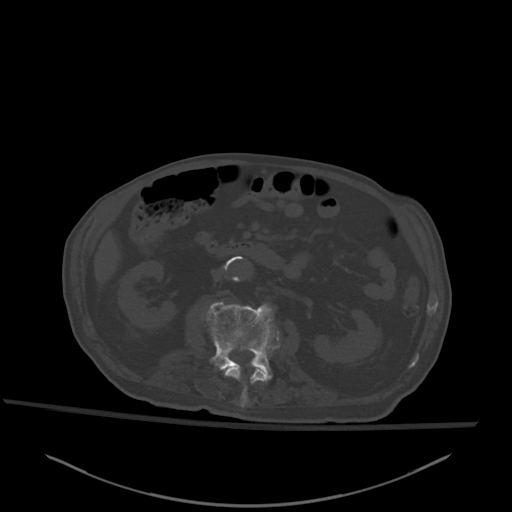
[im 64/90  soft-tissue]
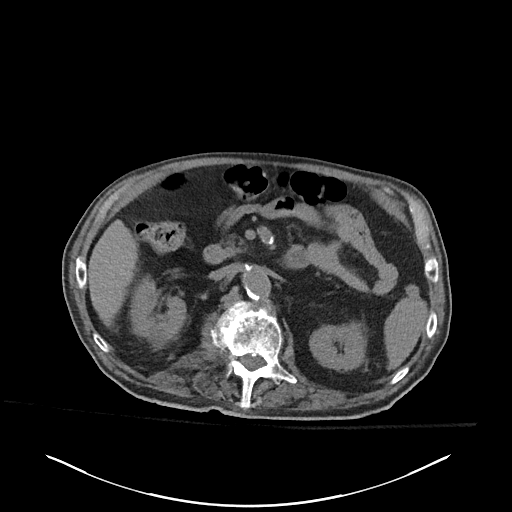
[im 71/90  soft-tissue]
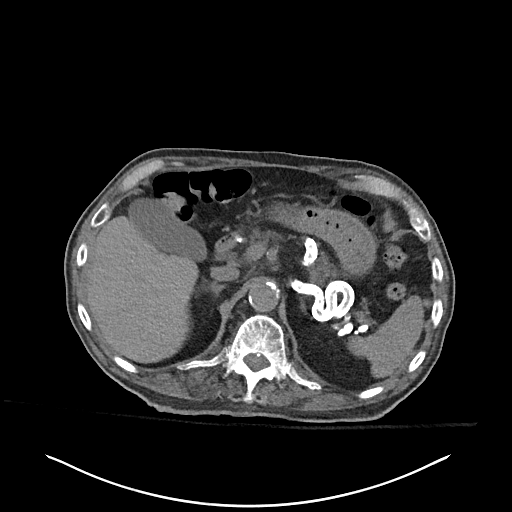
[im 78/90  soft-tissue]
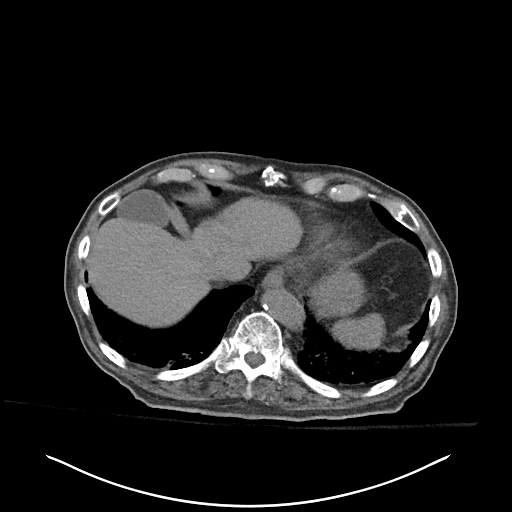
[im 86/90  soft-tissue]
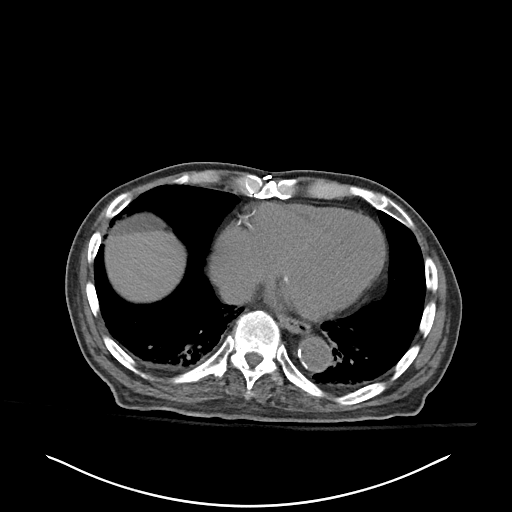

[Series 6: cor st · coronal · 0.76mm/px · 3 of 83 slices shown]
[im 28/83  soft-tissue]
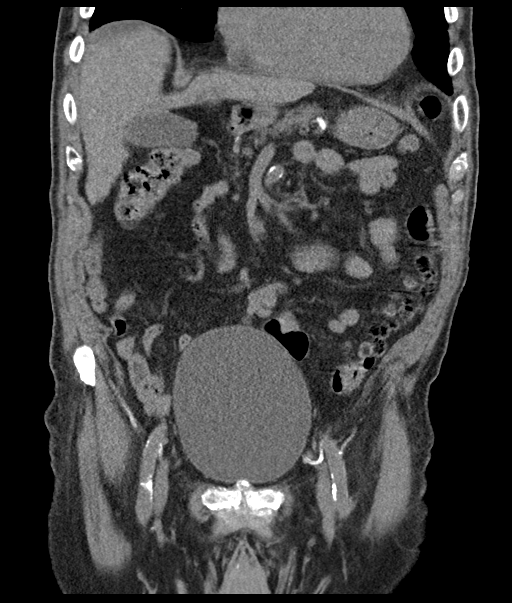
[im 37/83  soft-tissue]
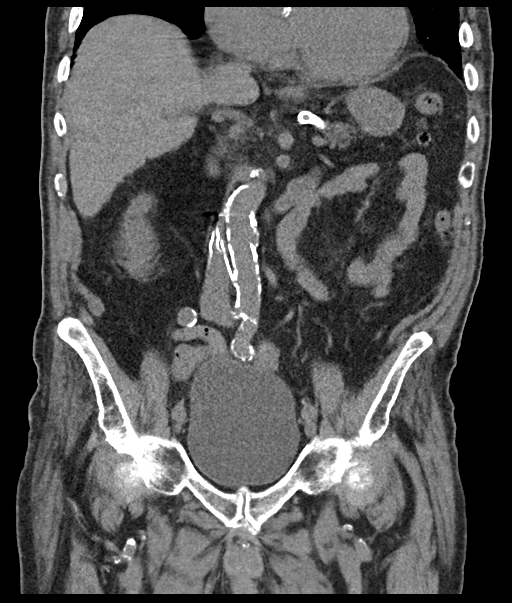
[im 46/83  soft-tissue]
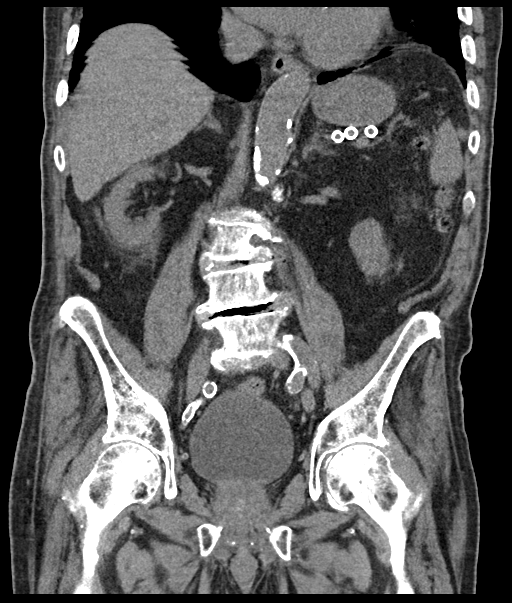

[16 of 46 positions shown; findings below may reference images not displayed]

FINDINGS: Lower chest: The heart is enlarged and there is no pericardial
effusion. Coronary artery calcifications are noted. Bronchiectasis
is noted in the lower lobes bilaterally and mild atelectasis is seen
at the lung bases.

Hepatobiliary: No focal liver abnormality is seen. No gallstones,
gallbladder wall thickening, or biliary dilatation.

Pancreas: Fatty atrophy is noted. No pancreatic ductal dilatation or
surrounding inflammatory changes.

Spleen: Normal in size without focal abnormality.

Adrenals/Urinary Tract: The adrenal glands are within normal limits.
A cyst is present in the mid right kidney measuring 1.7 cm. No renal
calculus or hydronephrosis. The bladder is unremarkable.

Stomach/Bowel: The stomach is within normal limits. No bowel
obstruction, free air, or pneumatosis. Scattered diverticula are
present along the colon without evidence of diverticulitis. The
appendix is not visualized on exam.

Vascular/Lymphatic: Aortic atherosclerotic calcification. Extensive
vascular calcifications are noted in the abdomen and pelvis. An IVC
filter is noted. No abdominal or pelvic lymphadenopathy.

Reproductive: The prostate gland is enlarged.

Other: A small fat containing inguinal hernia is noted on the left.
Small fat containing umbilical hernia. No ascites.

Musculoskeletal: Sternotomy wires are noted over the midline.
Degenerative changes are present in the thoracolumbar spine. There
is bilateral spondylolysis at L5 with grade 2 anterolisthesis.
IMPRESSION: 1. No acute intra-abdominal process.
2. No renal calculus or obstructive uropathy bilaterally. Right
renal cyst.
3. Diverticulosis without diverticulitis.

## 2022-03-12 IMAGING — DX DG ABD PORTABLE 1V
1 series · 2 of 2 positions shown · non-contrast
Comparison: None.

CLINICAL DATA: MRI clearance.

EXAM:
PORTABLE ABDOMEN - 1 VIEW

[Series 1: abdomen · 0.14mm/px · 2 of 2 slices shown]
[im 1/2]
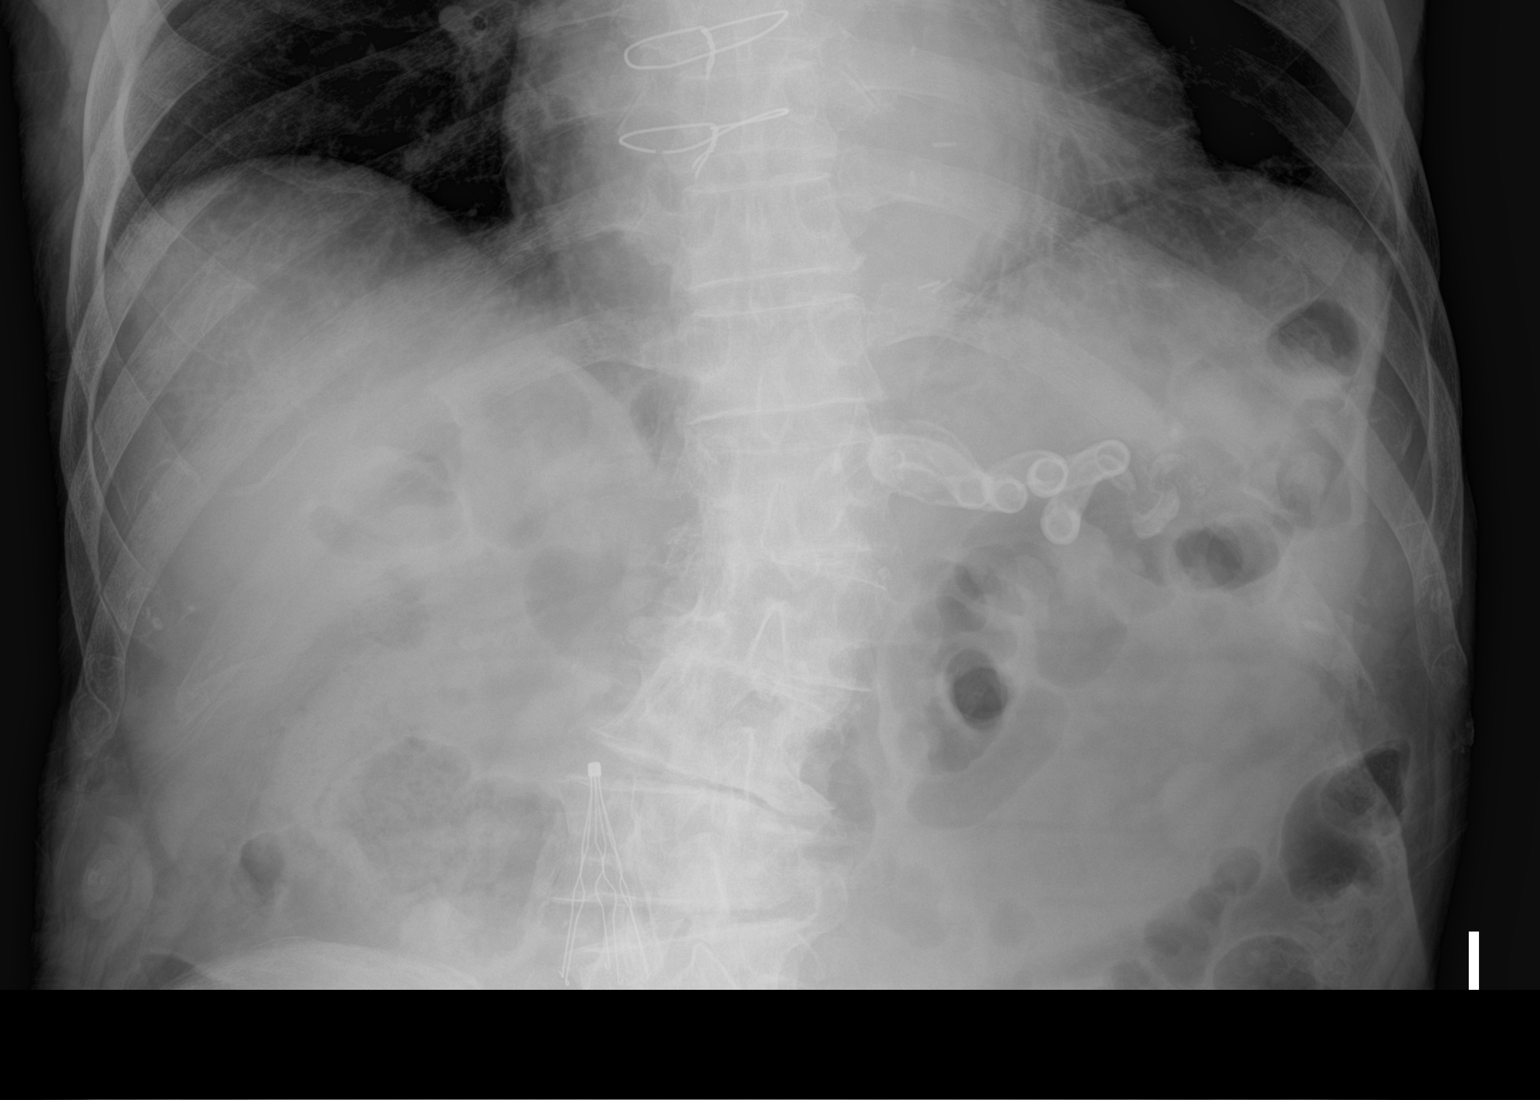
[im 2/2]
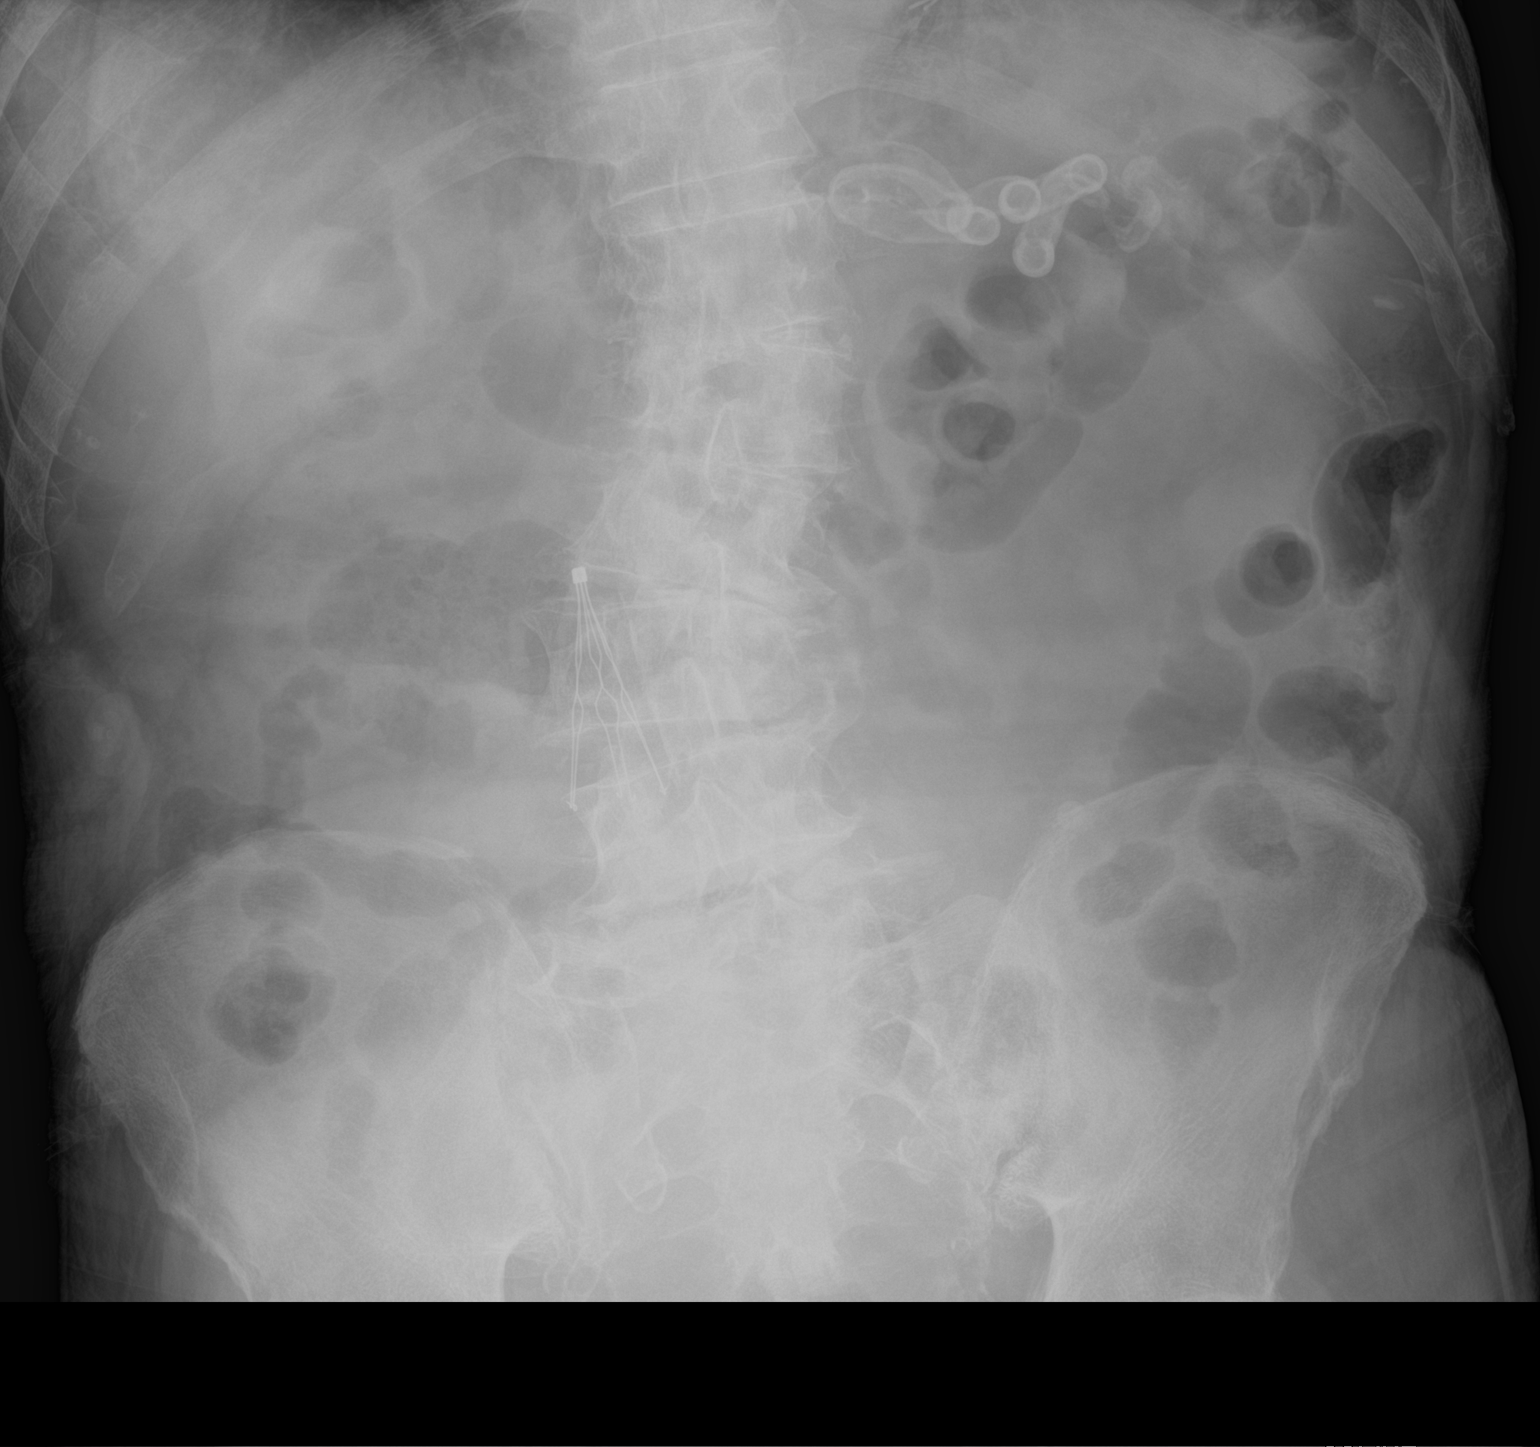

[2 of 2 positions shown; findings below may reference images not displayed]

FINDINGS: Sternotomy wires and mediastinal surgical clips are present. IVC
filter is present. There are dense calcifications of the splenic
artery.

Bowel-gas pattern is nonobstructive. There is average stool burden.
No suspicious calcifications. There is curvature of the lumbar spine
with multilevel degenerative change.
IMPRESSION: 1. Sternotomy wires, mediastinal clips and IVC filter present.
2. Nonobstructive bowel gas pattern.
